# Patient Record
Sex: Female | Born: 1945 | Race: White | Hispanic: No | Smoking: Current every day smoker
Health system: Southern US, Community
[De-identification: ages and names within clinical notes are randomized; demographics above are authoritative.]

## PROBLEM LIST (undated history)

## (undated) DIAGNOSIS — Z9221 Personal history of antineoplastic chemotherapy: Secondary | ICD-10-CM

## (undated) DIAGNOSIS — R32 Unspecified urinary incontinence: Secondary | ICD-10-CM

## (undated) DIAGNOSIS — N200 Calculus of kidney: Secondary | ICD-10-CM

## (undated) DIAGNOSIS — I10 Essential (primary) hypertension: Secondary | ICD-10-CM

## (undated) DIAGNOSIS — C801 Malignant (primary) neoplasm, unspecified: Secondary | ICD-10-CM

## (undated) DIAGNOSIS — E7800 Pure hypercholesterolemia, unspecified: Secondary | ICD-10-CM

## (undated) DIAGNOSIS — K219 Gastro-esophageal reflux disease without esophagitis: Secondary | ICD-10-CM

## (undated) DIAGNOSIS — B019 Varicella without complication: Secondary | ICD-10-CM

## (undated) DIAGNOSIS — C3490 Malignant neoplasm of unspecified part of unspecified bronchus or lung: Secondary | ICD-10-CM

## (undated) DIAGNOSIS — Z923 Personal history of irradiation: Secondary | ICD-10-CM

## (undated) HISTORY — DX: Calculus of kidney: N20.0

## (undated) HISTORY — DX: Gastro-esophageal reflux disease without esophagitis: K21.9

## (undated) HISTORY — DX: Varicella without complication: B01.9

## (undated) HISTORY — DX: Malignant neoplasm of unspecified part of unspecified bronchus or lung: C34.90

## (undated) HISTORY — DX: Pure hypercholesterolemia, unspecified: E78.00

## (undated) HISTORY — DX: Essential (primary) hypertension: I10

## (undated) HISTORY — DX: Unspecified urinary incontinence: R32

## (undated) HISTORY — PX: ABDOMINAL HYSTERECTOMY: SHX81

---

## 2012-04-16 ENCOUNTER — Encounter (HOSPITAL_COMMUNITY): Payer: Self-pay | Admitting: *Deleted

## 2012-04-16 ENCOUNTER — Emergency Department (HOSPITAL_COMMUNITY): Payer: Commercial Managed Care - PPO

## 2012-04-16 ENCOUNTER — Other Ambulatory Visit: Payer: Self-pay

## 2012-04-16 ENCOUNTER — Inpatient Hospital Stay (HOSPITAL_COMMUNITY)
Admission: EM | Admit: 2012-04-16 | Discharge: 2012-04-19 | DRG: 040 | Disposition: A | Discharge status: Home / Self Care | Payer: Commercial Managed Care - PPO | Attending: Internal Medicine | Admitting: Internal Medicine

## 2012-04-16 DIAGNOSIS — C801 Malignant (primary) neoplasm, unspecified: Secondary | ICD-10-CM | POA: Diagnosis present

## 2012-04-16 DIAGNOSIS — J984 Other disorders of lung: Secondary | ICD-10-CM | POA: Diagnosis present

## 2012-04-16 DIAGNOSIS — Z720 Tobacco use: Secondary | ICD-10-CM | POA: Diagnosis present

## 2012-04-16 DIAGNOSIS — G936 Cerebral edema: Secondary | ICD-10-CM | POA: Diagnosis present

## 2012-04-16 DIAGNOSIS — Z66 Do not resuscitate: Secondary | ICD-10-CM | POA: Diagnosis present

## 2012-04-16 DIAGNOSIS — G9349 Other encephalopathy: Secondary | ICD-10-CM | POA: Diagnosis present

## 2012-04-16 DIAGNOSIS — R079 Chest pain, unspecified: Secondary | ICD-10-CM | POA: Diagnosis present

## 2012-04-16 DIAGNOSIS — C799 Secondary malignant neoplasm of unspecified site: Secondary | ICD-10-CM | POA: Diagnosis present

## 2012-04-16 DIAGNOSIS — F411 Generalized anxiety disorder: Secondary | ICD-10-CM | POA: Diagnosis present

## 2012-04-16 DIAGNOSIS — C7800 Secondary malignant neoplasm of unspecified lung: Secondary | ICD-10-CM | POA: Diagnosis present

## 2012-04-16 DIAGNOSIS — R410 Disorientation, unspecified: Secondary | ICD-10-CM | POA: Diagnosis present

## 2012-04-16 DIAGNOSIS — F17200 Nicotine dependence, unspecified, uncomplicated: Secondary | ICD-10-CM | POA: Diagnosis present

## 2012-04-16 DIAGNOSIS — C781 Secondary malignant neoplasm of mediastinum: Secondary | ICD-10-CM | POA: Diagnosis present

## 2012-04-16 DIAGNOSIS — C7900 Secondary malignant neoplasm of unspecified kidney and renal pelvis: Secondary | ICD-10-CM | POA: Diagnosis present

## 2012-04-16 DIAGNOSIS — C7931 Secondary malignant neoplasm of brain: Principal | ICD-10-CM | POA: Diagnosis present

## 2012-04-16 DIAGNOSIS — R030 Elevated blood-pressure reading, without diagnosis of hypertension: Secondary | ICD-10-CM | POA: Diagnosis present

## 2012-04-16 DIAGNOSIS — C770 Secondary and unspecified malignant neoplasm of lymph nodes of head, face and neck: Secondary | ICD-10-CM | POA: Diagnosis present

## 2012-04-16 DIAGNOSIS — C7889 Secondary malignant neoplasm of other digestive organs: Secondary | ICD-10-CM | POA: Diagnosis present

## 2012-04-16 DIAGNOSIS — R630 Anorexia: Secondary | ICD-10-CM | POA: Diagnosis present

## 2012-04-16 DIAGNOSIS — R0789 Other chest pain: Secondary | ICD-10-CM | POA: Diagnosis present

## 2012-04-16 DIAGNOSIS — E279 Disorder of adrenal gland, unspecified: Secondary | ICD-10-CM | POA: Diagnosis present

## 2012-04-16 DIAGNOSIS — IMO0001 Reserved for inherently not codable concepts without codable children: Secondary | ICD-10-CM | POA: Diagnosis present

## 2012-04-16 HISTORY — DX: Malignant (primary) neoplasm, unspecified: C80.1

## 2012-04-16 LAB — BASIC METABOLIC PANEL
CO2: 28 mEq/L (ref 19–32)
Calcium: 9.5 mg/dL (ref 8.4–10.5)
Creatinine, Ser: 0.71 mg/dL (ref 0.50–1.10)
Glucose, Bld: 96 mg/dL (ref 70–99)

## 2012-04-16 LAB — CBC
Hemoglobin: 14.6 g/dL (ref 12.0–15.0)
MCH: 30.2 pg (ref 26.0–34.0)
MCV: 86.2 fL (ref 78.0–100.0)
Platelets: 380 10*3/uL (ref 150–400)
RBC: 4.84 MIL/uL (ref 3.87–5.11)

## 2012-04-16 MED ORDER — HYDROMORPHONE HCL PF 1 MG/ML IJ SOLN
0.5000 mg | INTRAMUSCULAR | Status: DC | PRN
  Administered 2012-04-16 – 2012-04-17 (×2): 0.5 mg via INTRAVENOUS
  Administered 2012-04-17 – 2012-04-18 (×6): 1 mg via INTRAVENOUS
  Filled 2012-04-16 (×9): qty 1

## 2012-04-16 MED ORDER — IOHEXOL 300 MG/ML  SOLN
100.0000 mL | Freq: Once | INTRAMUSCULAR | Status: AC | PRN
  Administered 2012-04-16: 100 mL via INTRAVENOUS

## 2012-04-16 MED ORDER — DEXAMETHASONE SODIUM PHOSPHATE 10 MG/ML IJ SOLN
10.0000 mg | Freq: Four times a day (QID) | INTRAMUSCULAR | Status: AC
  Administered 2012-04-17 (×3): 10 mg via INTRAVENOUS
  Filled 2012-04-16 (×5): qty 1

## 2012-04-16 MED ORDER — DEXAMETHASONE 4 MG PO TABS
4.0000 mg | ORAL_TABLET | Freq: Four times a day (QID) | ORAL | Status: DC
  Administered 2012-04-17 – 2012-04-19 (×8): 4 mg via ORAL
  Filled 2012-04-16 (×10): qty 1

## 2012-04-16 MED ORDER — MORPHINE SULFATE 4 MG/ML IJ SOLN
4.0000 mg | Freq: Once | INTRAMUSCULAR | Status: AC
  Administered 2012-04-16: 4 mg via INTRAVENOUS
  Filled 2012-04-16: qty 1

## 2012-04-16 MED ORDER — DEXAMETHASONE SODIUM PHOSPHATE 10 MG/ML IJ SOLN
10.0000 mg | Freq: Once | INTRAMUSCULAR | Status: AC
  Administered 2012-04-16: 10 mg via INTRAVENOUS
  Filled 2012-04-16: qty 1

## 2012-04-16 MED ORDER — ONDANSETRON HCL 4 MG/2ML IJ SOLN
4.0000 mg | Freq: Once | INTRAMUSCULAR | Status: AC
  Administered 2012-04-16: 4 mg via INTRAVENOUS
  Filled 2012-04-16: qty 2

## 2012-04-16 NOTE — H&P (Addendum)
Patient's PCP: Patient reports that she does not have a primary care physician.  Chief Complaint: Chest pain and altered mental status  History of Present Illness: Isabella Carpenter is a 67 y.o. Caucasian female with no significant past medical history and has not seen a physician for a long time who was brought in by family for the above reasons.  Over the last week family has noted that she has had intermittent episodes of confusion.  Per daughter, on 04/14/2012, patient appeared confused on the telephone and had only short one-word yes or no responses.  Over the weekend family was notified by patient's coworker on facebook that the patient was confused at work over the last week.  Over the last 2-3 weeks she has had intermittent episodes of left-sided chest pain.  When family confronted the patient today, she was agreeable to coming to the emergency department for further evaluation.  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  Hospitalist service was consulted for further care and management.  Patient has not had any recent fevers, chills, nausea, vomiting, shortness of breath, or abdominal pain.  She did complain of diarrhea 2 days ago which has resolved.  She does complain of left-sided headache with intermittent visual changes.  Currently denies any visual changes.  Review of Systems: All systems reviewed with the patient and positive as per history of present illness, otherwise all other systems are negative.  History reviewed. No pertinent past medical history. History reviewed. No pertinent past surgical history. Family History  Problem Relation Age of Onset  . Heart attack Father   . Kidney cancer Father    History   Social History  . Marital Status: Widowed    Spouse Name: N/A    Number of Children: N/A  . Years of Education: N/A   Occupational History  . Not on file.   Social History Main Topics  . Smoking status: Current Every Day Smoker -- 2.00  packs/day for 30 years    Types: Cigarettes  . Smokeless tobacco: Not on file  . Alcohol Use: Not on file  . Drug Use: Not on file  . Sexually Active: Not on file   Other Topics Concern  . Not on file   Social History Narrative  . No narrative on file   Allergies: Review of patient's allergies indicates no known allergies.  Home Meds: Prior to Admission medications   Medication Sig Start Date End Date Taking? Authorizing Provider  ibuprofen (ADVIL,MOTRIN) 200 MG tablet Take 200 mg by mouth every 6 (six) hours as needed for pain. For pain   Yes Historical Provider, MD    Physical Exam: Blood pressure 174/87, pulse 66, temperature 98.3 F (36.8 C), temperature source Oral, resp. rate 18, SpO2 94.00%. General: Awake, Oriented x3, No acute distress. HEENT: EOMI, Moist mucous membranes, able to read words and sentences without difficulty. Neck: Supple, 2 palpable left supraclavicular nodes CV: S1 and S2 Lungs: Clear to ascultation bilaterally Abdomen: Soft, Nontender, Nondistended, +bowel sounds. Ext: Good pulses. Trace edema. No clubbing or cyanosis noted. Neuro: Cranial Nerves II-XII grossly intact. Has 5/5 motor strength in upper and lower extremities. Chest: No reproducible pain with palpation.   Lab results:  Recent Labs  04/16/12 1924  NA 138  K 3.5  CL 102  CO2 28  GLUCOSE 96  BUN 8  CREATININE 0.71  CALCIUM 9.5   No results found for this basename: AST, ALT, ALKPHOS, BILITOT, PROT, ALBUMIN,  in the last 72  hours No results found for this basename: LIPASE, AMYLASE,  in the last 72 hours  Recent Labs  04/16/12 1924  WBC 8.6  HGB 14.6  HCT 41.7  MCV 86.2  PLT 380    Recent Labs  04/16/12 1924  TROPONINI <0.30   No components found with this basename: POCBNP,  No results found for this basename: DDIMER,  in the last 72 hours No results found for this basename: HGBA1C,  in the last 72 hours No results found for this basename: CHOL, HDL, LDLCALC,  TRIG, CHOLHDL, LDLDIRECT,  in the last 72 hours No results found for this basename: TSH, T4TOTAL, FREET3, T3FREE, THYROIDAB,  in the last 72 hours No results found for this basename: VITAMINB12, FOLATE, FERRITIN, TIBC, IRON, RETICCTPCT,  in the last 72 hours Imaging results:  Dg Chest 2 View  04/16/2012  *RADIOLOGY REPORT*  Clinical Data: Chest pain.  Neck pain.  Tobacco use.  CHEST - 2 VIEW  Comparison: None.  Findings: Scattered pulmonary nodules and pulmonary masses are observed bilaterally.  An index mass in the right to middle lobe measures 5.4 x 3.2 by 5.2 cm.  Mass-like scarring noted along the left lung apex.  No cardiomegaly noted, but the aortic arch seems to be right-sided.  Unusual lucency at the T12-L1 level on the frontal projection may to be due to bowel gas although a vertebral anomaly cannot be totally excluded.  No pleural effusion is identified.  IMPRESSION:  1.  Scattered nodules and masses throughout both lungs.  Appearance favors metastatic malignancy to both lungs.  Fungal pneumonia and rheumatoid nodules are less likely differential diagnostic considerations.  CT chest with contrast is recommended for further workup, and considering the bilaterality of disease, imaging of the abdomen and pelvis may be prudent as well. 2.  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 3.  Lucency at the thoracolumbar junction on the frontal projection is somewhat unusual but probably simply due to bowel gas.  This could be further characterized on the CT exam to ensure that does not represent a vertebral anomaly.   Original Report Authenticated By: Gaylyn Rong, M.D.    Ct Head W Wo Contrast  04/16/2012  *RADIOLOGY REPORT*  Clinical Data: Forgetful; known lung mass.  Assess for brain metastases.  CT HEAD WITHOUT AND WITH CONTRAST  Technique:  Contiguous axial images were obtained from the base of the skull through the vertex without and with intravenous contrast.  Contrast:  OMNIPAQUE IOHEXOL 300 MG/ML  SOLN  Comparison: None.  Findings: Evaluation is suboptimal due to motion artifact. However, multiple prominent areas of decreased attenuation are seen throughout the cerebral hemispheres and at the left side of the cerebellum, compatible with multiple metastases.  Identifiable masses measure perhaps 2.0 cm at the high left frontal lobe, 2.5 cm at the expected location of the frontal horn of the left lateral ventricle, 1.5 cm at the left occipital lobe, 1.5 cm at the left cerebellar hemisphere and 1.7 cm at the posterior right parietal lobe.  There is perhaps 7 mm of rightward midline shift, though this is difficult to fully characterize.  There is no evidence of acute infarction, or intra- or extra-axial hemorrhage on CT.  There is no evidence of fracture; visualized osseous structures are unremarkable in appearance.  The visualized portions of the orbits are within normal limits.  The paranasal sinuses and mastoid air cells are well-aerated.  No significant soft tissue abnormalities are seen.  IMPRESSION:  1.  Multiple cerebral and  cerebellar metastases noted, with extensive areas of decreased attenuation, effacement of the frontal horn of the left lateral ventricle and perhaps 7 mm of rightward midline shift. 2.  No definite evidence of intracranial hemorrhage.  These results were called by telephone on 04/16/2012 at 09:21 p.m. to Earley Favor PA, who verbally acknowledged these results.   Original Report Authenticated By: Tonia Ghent, M.D.    Ct Chest W Contrast  04/16/2012  *RADIOLOGY REPORT*  Clinical Data:  Assess for chest and abdominal metastatic disease; known lung masses.  CT CHEST, ABDOMEN AND PELVIS WITH CONTRAST  Technique:  Multidetector CT imaging of the chest, abdomen and pelvis was performed following the standard protocol during bolus administration of intravenous contrast.  Contrast: OMNIPAQUE IOHEXOL 300 MG/ML  SOLN  Comparison:   None.  CT CHEST  Findings:   There are two dominant masses within the lungs, with a third somewhat smaller mass also seen.  The largest mass is noted at the left upper lobe, with significant architectural distortion; it measures approximately 8.6 x 4.4 x 6.5 cm, with partial occlusion of the bronchioles to the left upper lobe, and marked mass effect on the left main pulmonary artery.  A large 6.2 x 3.7 x 3.9 cm mass is noted within the right middle lobe.  There is also a spiculated 3.1 x 3.1 x 2.4 cm mass at the right upper lobe, with significant architectural distortion.  Innumerable smaller metastases are noted throughout both lungs.  No pleural effusion is identified.  There is mild underlying emphysema, noted particularly at the right upper lobe.  No pneumothorax is seen.  Scattered coronary artery calcifications are noted.  The patient's left upper lobe mass extends into the mediastinum, with irregular lobulations.  It remains a few millimeters from both the trachea and the descending thoracic aorta, though slightly closer to the course of the left subclavian artery.  Scattered metastases are noted within the mediastinum, demonstrating centrally decreased attenuation.  These are noted at the right paratracheal and periaortic regions, measuring up to 1.3 cm in short axis.  No pericardial effusion is identified. Scattered calcific atherosclerotic disease is noted along the aortic arch.  The great vessels are grossly unremarkable in appearance.  Minimal hypodensity at the left thyroid lobe is nonspecific in appearance, and likely benign given its size.  An enlarged 1.4 cm left supraclavicular node is seen.  Scattered axillary nodes remain normal in size.  No acute osseous abnormalities are seen.  IMPRESSION:  1.  Two dominant masses within the lungs, at the left upper lobe and right middle lobe.  Smaller spiculated mass at the right upper lobe.  Significant associated architectural distortion; the left upper lobe mass extends into the  mediastinum.  Partial occlusion of the bronchus to the left upper lobe, and significant mass effect on the left main pulmonary artery. 2.  Innumerable small metastases noted throughout both lungs. 3.  Mild underlying emphysema, particularly at the right upper lobe. 4.  Scattered coronary artery calcifications seen. 5.  Scattered metastases within the mediastinum, at the right paratracheal and periaortic regions, measuring up to 1.3 cm in short axis. 6.  1.4 cm centrally hypodense left supraclavicular node, concerning for metastatic disease. 7.  Minimal hypodensity at the left thyroid lobe is nonspecific, and likely benign given its size.  CT ABDOMEN AND PELVIS  Findings:  A few hypodensities within the liver are nonspecific but likely reflect small cysts.  The liver is otherwise unremarkable in appearance.  Two vague hypodense  lesions are noted within the spleen, measuring 2.0 and 1.5 cm, concerning for metastases.  There is a large complex heterogeneous left adrenal mass, measuring 4.6 cm in size, concerning for metastatic disease.  The right adrenal gland is unremarkable in appearance.  A large stone is noted within the gallbladder; the gallbladder is otherwise grossly unremarkable.  The pancreas is grossly unremarkable.  A nonobstructing 7 mm stone is noted at the interpole region of the left kidney.  Scattered bilateral renal cysts are seen, measuring up to 1.7 cm in size. There is no evidence of hydronephrosis.  No obstructing ureteral stones are seen.  No perinephric stranding is appreciated.  No free fluid is identified.  The small bowel is unremarkable in appearance.  The stomach is within normal limits.  No acute vascular abnormalities are seen.  Scattered calcification is noted along the abdominal aorta and its branches.  The appendix is normal in caliber, without evidence for appendicitis.  The colon is unremarkable in appearance.  The bladder is mildly distended and grossly unremarkable in appearance.   The patient is status post hysterectomy.  No suspicious adnexal masses are seen; the left ovary is unremarkable in appearance.  No inguinal lymphadenopathy is seen.  No acute osseous abnormalities are identified.  There is nonspecific sclerosis at the pubic symphysis, and vacuum phenomenon and mild sclerotic change are noted at the sacroiliac joints bilaterally.  Degenerative change is noted along the lower lumbar spine.  IMPRESSION:  1.  Large complex left adrenal mass, measuring 4.6 cm, concerning for metastatic disease. 2.  Two vague hypodensities within the spleen, concerning for metastatic disease. 3.  Small hepatic and renal hypodensities likely reflect cysts. 4.  Cholelithiasis; gallbladder otherwise unremarkable in appearance. 5.  Nonobstructing 7 mm stone at the interpole region of the left kidney. 6.  Scattered calcification along the abdominal aorta and its branches. 7.  No definite evidence for osseous metastatic disease; nonspecific sclerosis at the pubic symphysis, and mild degenerative change along the lower lumbar spine and at the sacroiliac joints.   Original Report Authenticated By: Tonia Ghent, M.D.    Ct Abdomen Pelvis W Contrast  04/16/2012  *RADIOLOGY REPORT*  Clinical Data:  Assess for chest and abdominal metastatic disease; known lung masses.  CT CHEST, ABDOMEN AND PELVIS WITH CONTRAST  Technique:  Multidetector CT imaging of the chest, abdomen and pelvis was performed following the standard protocol during bolus administration of intravenous contrast.  Contrast: OMNIPAQUE IOHEXOL 300 MG/ML  SOLN  Comparison:   None.  CT CHEST  Findings:  There are two dominant masses within the lungs, with a third somewhat smaller mass also seen.  The largest mass is noted at the left upper lobe, with significant architectural distortion; it measures approximately 8.6 x 4.4 x 6.5 cm, with partial occlusion of the bronchioles to the left upper lobe, and marked mass effect on the left main pulmonary  artery.  A large 6.2 x 3.7 x 3.9 cm mass is noted within the right middle lobe.  There is also a spiculated 3.1 x 3.1 x 2.4 cm mass at the right upper lobe, with significant architectural distortion.  Innumerable smaller metastases are noted throughout both lungs.  No pleural effusion is identified.  There is mild underlying emphysema, noted particularly at the right upper lobe.  No pneumothorax is seen.  Scattered coronary artery calcifications are noted.  The patient's left upper lobe mass extends into the mediastinum, with irregular lobulations.  It remains a few millimeters from  both the trachea and the descending thoracic aorta, though slightly closer to the course of the left subclavian artery.  Scattered metastases are noted within the mediastinum, demonstrating centrally decreased attenuation.  These are noted at the right paratracheal and periaortic regions, measuring up to 1.3 cm in short axis.  No pericardial effusion is identified. Scattered calcific atherosclerotic disease is noted along the aortic arch.  The great vessels are grossly unremarkable in appearance.  Minimal hypodensity at the left thyroid lobe is nonspecific in appearance, and likely benign given its size.  An enlarged 1.4 cm left supraclavicular node is seen.  Scattered axillary nodes remain normal in size.  No acute osseous abnormalities are seen.  IMPRESSION:  1.  Two dominant masses within the lungs, at the left upper lobe and right middle lobe.  Smaller spiculated mass at the right upper lobe.  Significant associated architectural distortion; the left upper lobe mass extends into the mediastinum.  Partial occlusion of the bronchus to the left upper lobe, and significant mass effect on the left main pulmonary artery. 2.  Innumerable small metastases noted throughout both lungs. 3.  Mild underlying emphysema, particularly at the right upper lobe. 4.  Scattered coronary artery calcifications seen. 5.  Scattered metastases within the  mediastinum, at the right paratracheal and periaortic regions, measuring up to 1.3 cm in short axis. 6.  1.4 cm centrally hypodense left supraclavicular node, concerning for metastatic disease. 7.  Minimal hypodensity at the left thyroid lobe is nonspecific, and likely benign given its size.  CT ABDOMEN AND PELVIS  Findings:  A few hypodensities within the liver are nonspecific but likely reflect small cysts.  The liver is otherwise unremarkable in appearance.  Two vague hypodense lesions are noted within the spleen, measuring 2.0 and 1.5 cm, concerning for metastases.  There is a large complex heterogeneous left adrenal mass, measuring 4.6 cm in size, concerning for metastatic disease.  The right adrenal gland is unremarkable in appearance.  A large stone is noted within the gallbladder; the gallbladder is otherwise grossly unremarkable.  The pancreas is grossly unremarkable.  A nonobstructing 7 mm stone is noted at the interpole region of the left kidney.  Scattered bilateral renal cysts are seen, measuring up to 1.7 cm in size. There is no evidence of hydronephrosis.  No obstructing ureteral stones are seen.  No perinephric stranding is appreciated.  No free fluid is identified.  The small bowel is unremarkable in appearance.  The stomach is within normal limits.  No acute vascular abnormalities are seen.  Scattered calcification is noted along the abdominal aorta and its branches.  The appendix is normal in caliber, without evidence for appendicitis.  The colon is unremarkable in appearance.  The bladder is mildly distended and grossly unremarkable in appearance.  The patient is status post hysterectomy.  No suspicious adnexal masses are seen; the left ovary is unremarkable in appearance.  No inguinal lymphadenopathy is seen.  No acute osseous abnormalities are identified.  There is nonspecific sclerosis at the pubic symphysis, and vacuum phenomenon and mild sclerotic change are noted at the sacroiliac joints  bilaterally.  Degenerative change is noted along the lower lumbar spine.  IMPRESSION:  1.  Large complex left adrenal mass, measuring 4.6 cm, concerning for metastatic disease. 2.  Two vague hypodensities within the spleen, concerning for metastatic disease. 3.  Small hepatic and renal hypodensities likely reflect cysts. 4.  Cholelithiasis; gallbladder otherwise unremarkable in appearance. 5.  Nonobstructing 7 mm stone at the interpole  region of the left kidney. 6.  Scattered calcification along the abdominal aorta and its branches. 7.  No definite evidence for osseous metastatic disease; nonspecific sclerosis at the pubic symphysis, and mild degenerative change along the lower lumbar spine and at the sacroiliac joints.   Original Report Authenticated By: Tonia Ghent, M.D.    Other results: EKG: Normal sinus rhythm with heart rate of 64.  Assessment & Plan by Problem: Chest pain Initial troponin negative.  EKG not concerning for any ischemic changes.  Continue to monitor on telemetry, cycle cardiac enzymes to rule patient out for acute coronary syndrome.  Suspect patient's chest pain is likely related to metastatic disease in her lungs as imaging showed 2 dominant mass is in the left upper and right middle lobe.  On the left there is a partial occlusion of the bronchus to the left upper lobe with significant mass effect on the left main pulmonary artery.  Consider pulmonology consultation in the morning.  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  Patient has palpable left supraclavicular lymph nodes which may be easily biopsied.  General surgery will need to be called in the morning for consultation.  Once pathological diagnosis is obtained, may benefit from oncology evaluation.  Acute delirium due to metastatic disease to the brain Imaging showed multiple cerebral and cerebellar metastasis with effacement  of frontal horn of the left lateral ventricle and perhaps 7 mm rightward midline shift.  Discussed with Dr. Jordan Likes, neurosurgery, given patient's extensive metastatic disease did not think he could offer the patient anything from a neurosurgical standpoint as she does not have any deficits at this time.  Dr. Jordan Likes, recommended dexamethasone 10 mg IV every 6 hours for 4 doses then dexamethasone 4 mg by mouth every 6 hours and recommended whole brain radiation.  Discussed with Dr. Kathrynn Running, radiation oncology, who will evaluate the patient in the morning.  Check blood sugars a.c. and at bedtime given steroids.  Elevated blood pressure Likely related to pain and anxiety.  Continue to monitor.  When necessary hydralazine for systolic blood pressure greater than 160.  Tobacco abuse Counseled the patient on cessation.  Prophylaxis Lovenox.  CODE STATUS DO NOT RESUSCITATE/DO NOT INTUBATE.  This was discussed with the patient and daughter at the time of admission.  Disposition Admit the patient as inpatient to telemetry.  Daughter updated at bedside.  Time spent on admission, talking to the patient, and coordinating care was: 60 mins.  Bevin Mayall A, MD 04/16/2012, 11:43 PM

## 2012-04-16 NOTE — ED Notes (Addendum)
Pt reported the Cp has been present for 3 weeks. Family forced Pt to come in to ED for an evaluation tonight.. During Pt assessment Pt stated Her family did not know she was here but Pt daughter was sitting chair next to PT. Pt questioned about the statement that family did not know about being in the E. Pt did not have an answer. Pt Neuro in tact and A/Ox 4. Pt also denies any SI thoughts or plan.

## 2012-04-16 NOTE — ED Provider Notes (Signed)
History     CSN: [id number]  Arrival date & time 04/16/12  1851   First MD Initiated Contact with Patient 04/16/12 1955      Chief Complaint  Patient presents with  . Chest Pain    (Consider location/radiation/quality/duration/timing/severity/associated sxs/prior treatment) HPI Comments: Patient reports, that for the last 3, weeks.  She's had intermittent, worsening chest pain, which is vague in, nature.  It's not precipitated by movement position, food, she is a 2 pack a day smoker for many years.  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  All day every day.  Her appetite has not changed.  She states she is not sleeping well for the last couple weeks.  She has also noted some firm nodules in the supraclavicular area on the left that are not painful.  Her family brought her to the emergency department today for evaluation of a growing concerned by her coworkers for weight loss, and the fact that she does not quite appear to be herself.  She has become forgetful, which is very unlike her.  Patient is a 67 y.o. female presenting with chest pain. The history is provided by the patient.  Chest Pain Pain location:  Unable to specify Pain quality: dull   Pain radiates to:  Does not radiate Pain radiates to the back: no   Pain severity:  Moderate Onset quality:  Unable to specify Duration:  3 weeks Timing:  Intermittent Progression:  Worsening Chronicity:  New Context: at rest   Context: not breathing, not lifting, no movement and not raising an arm   Relieved by:  None tried Worsened by:  Nothing tried Associated symptoms: cough   Associated symptoms: no dizziness, no fever, no headache, no nausea, no numbness, no shortness of breath and no weakness   Risk factors: smoking     Past Medical History  Diagnosis Date  . Cancer     Past Surgical History  Procedure Laterality Date  .  Abdominal hysterectomy      Family History  Problem Relation Age of Onset  . Heart attack Father   . Kidney cancer Father     History  Substance Use Topics  . Smoking status: Current Every Day Smoker -- 2.00 packs/day for 30 years    Types: Cigarettes  . Smokeless tobacco: Not on file  . Alcohol Use: Not on file    OB History   Grav Para Term Preterm Abortions TAB SAB Ect Mult Living                  Review of Systems  Constitutional: Negative for fever and chills.  HENT: Negative for neck pain and neck stiffness.   Respiratory: Positive for cough. Negative for chest tightness and shortness of breath.   Cardiovascular: Positive for chest pain. Negative for leg swelling.  Gastrointestinal: Negative for nausea.  Skin: Positive for pallor. Negative for wound.  Neurological: Negative for dizziness, speech difficulty, weakness, numbness and headaches.  All other systems reviewed and are negative.    Allergies  Review of patient's allergies indicates no known allergies.  Home Medications   No current outpatient prescriptions on file.  BP 181/86  Pulse 63  Temp(Src) 97.8 F (36.6 C) (Oral)  Resp 22  Ht 5' 5.5" (1.664 m)  Wt 131 lb 6.3 oz (59.6 kg)  BMI 21.52 kg/m2  SpO2 98%  Physical  Exam  Nursing note and vitals reviewed. Constitutional: She is oriented to person, place, and time. She appears well-developed and well-nourished.  HENT:  Head: Normocephalic.  Eyes: Pupils are equal, round, and reactive to light.  Neck: Normal range of motion. Neck supple.    Cardiovascular: Normal rate and regular rhythm.   Pulmonary/Chest: Effort normal and breath sounds normal. She exhibits tenderness.  Abdominal: Soft. She exhibits no distension. There is no tenderness.  Musculoskeletal: Normal range of motion. She exhibits no edema and no tenderness.  Lymphadenopathy:    She has no cervical adenopathy.  Neurological: She is oriented to person, place, and time.  Skin:  Skin is warm and dry. No rash noted. There is pallor.    ED Course  Procedures (including critical care time)  Labs Reviewed  BASIC METABOLIC PANEL - Abnormal; Notable for the following:    GFR calc non Af Amer 89 (*)    All other components within normal limits  CBC  TROPONIN I  TROPONIN I  TROPONIN I  BASIC METABOLIC PANEL  CBC   Dg Chest 2 View  04/16/2012  *RADIOLOGY REPORT*  Clinical Data: Chest pain.  Neck pain.  Tobacco use.  CHEST - 2 VIEW  Comparison: None.  Findings: Scattered pulmonary nodules and pulmonary masses are observed bilaterally.  An index mass in the right to middle lobe measures 5.4 x 3.2 by 5.2 cm.  Mass-like scarring noted along the left lung apex.  No cardiomegaly noted, but the aortic arch seems to be right-sided.  Unusual lucency at the T12-L1 level on the frontal projection may to be due to bowel gas although a vertebral anomaly cannot be totally excluded.  No pleural effusion is identified.  IMPRESSION:  1.  Scattered nodules and masses throughout both lungs.  Appearance favors metastatic malignancy to both lungs.  Fungal pneumonia and rheumatoid nodules are less likely differential diagnostic considerations.  CT chest with contrast is recommended for further workup, and considering the bilaterality of disease, imaging of the abdomen and pelvis may be prudent as well. 2.  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 3.  Lucency at the thoracolumbar junction on the frontal projection is somewhat unusual but probably simply due to bowel gas.  This could be further characterized on the CT exam to ensure that does not represent a vertebral anomaly.   Original Report Authenticated By: Gaylyn Rong, M.D.    Ct Head W Wo Contrast  04/16/2012  *RADIOLOGY REPORT*  Clinical Data: Forgetful; known lung mass.  Assess for brain metastases.  CT HEAD WITHOUT AND WITH CONTRAST  Technique:  Contiguous axial images were obtained from the base of the skull through the  vertex without and with intravenous contrast.  Contrast: OMNIPAQUE IOHEXOL 300 MG/ML  SOLN  Comparison: None.  Findings: Evaluation is suboptimal due to motion artifact. However, multiple prominent areas of decreased attenuation are seen throughout the cerebral hemispheres and at the left side of the cerebellum, compatible with multiple metastases.  Identifiable masses measure perhaps 2.0 cm at the high left frontal lobe, 2.5 cm at the expected location of the frontal horn of the left lateral ventricle, 1.5 cm at the left occipital lobe, 1.5 cm at the left cerebellar hemisphere and 1.7 cm at the posterior right parietal lobe.  There is perhaps 7 mm of rightward midline shift, though this is difficult to fully characterize.  There is no evidence of acute infarction, or intra- or extra-axial hemorrhage on CT.  There is no evidence of  fracture; visualized osseous structures are unremarkable in appearance.  The visualized portions of the orbits are within normal limits.  The paranasal sinuses and mastoid air cells are well-aerated.  No significant soft tissue abnormalities are seen.  IMPRESSION:  1.  Multiple cerebral and cerebellar metastases noted, with extensive areas of decreased attenuation, effacement of the frontal horn of the left lateral ventricle and perhaps 7 mm of rightward midline shift. 2.  No definite evidence of intracranial hemorrhage.  These results were called by telephone on 04/16/2012 at 09:21 p.m. to Earley Favor PA, who verbally acknowledged these results.   Original Report Authenticated By: Tonia Ghent, M.D.    Ct Chest W Contrast  04/16/2012  *RADIOLOGY REPORT*  Clinical Data:  Assess for chest and abdominal metastatic disease; known lung masses.  CT CHEST, ABDOMEN AND PELVIS WITH CONTRAST  Technique:  Multidetector CT imaging of the chest, abdomen and pelvis was performed following the standard protocol during bolus administration of intravenous contrast.  Contrast: OMNIPAQUE  IOHEXOL 300 MG/ML  SOLN  Comparison:   None.  CT CHEST  Findings:  There are two dominant masses within the lungs, with a third somewhat smaller mass also seen.  The largest mass is noted at the left upper lobe, with significant architectural distortion; it measures approximately 8.6 x 4.4 x 6.5 cm, with partial occlusion of the bronchioles to the left upper lobe, and marked mass effect on the left main pulmonary artery.  A large 6.2 x 3.7 x 3.9 cm mass is noted within the right middle lobe.  There is also a spiculated 3.1 x 3.1 x 2.4 cm mass at the right upper lobe, with significant architectural distortion.  Innumerable smaller metastases are noted throughout both lungs.  No pleural effusion is identified.  There is mild underlying emphysema, noted particularly at the right upper lobe.  No pneumothorax is seen.  Scattered coronary artery calcifications are noted.  The patient's left upper lobe mass extends into the mediastinum, with irregular lobulations.  It remains a few millimeters from both the trachea and the descending thoracic aorta, though slightly closer to the course of the left subclavian artery.  Scattered metastases are noted within the mediastinum, demonstrating centrally decreased attenuation.  These are noted at the right paratracheal and periaortic regions, measuring up to 1.3 cm in short axis.  No pericardial effusion is identified. Scattered calcific atherosclerotic disease is noted along the aortic arch.  The great vessels are grossly unremarkable in appearance.  Minimal hypodensity at the left thyroid lobe is nonspecific in appearance, and likely benign given its size.  An enlarged 1.4 cm left supraclavicular node is seen.  Scattered axillary nodes remain normal in size.  No acute osseous abnormalities are seen.  IMPRESSION:  1.  Two dominant masses within the lungs, at the left upper lobe and right middle lobe.  Smaller spiculated mass at the right upper lobe.  Significant associated  architectural distortion; the left upper lobe mass extends into the mediastinum.  Partial occlusion of the bronchus to the left upper lobe, and significant mass effect on the left main pulmonary artery. 2.  Innumerable small metastases noted throughout both lungs. 3.  Mild underlying emphysema, particularly at the right upper lobe. 4.  Scattered coronary artery calcifications seen. 5.  Scattered metastases within the mediastinum, at the right paratracheal and periaortic regions, measuring up to 1.3 cm in short axis. 6.  1.4 cm centrally hypodense left supraclavicular node, concerning for metastatic disease. 7.  Minimal hypodensity at  the left thyroid lobe is nonspecific, and likely benign given its size.  CT ABDOMEN AND PELVIS  Findings:  A few hypodensities within the liver are nonspecific but likely reflect small cysts.  The liver is otherwise unremarkable in appearance.  Two vague hypodense lesions are noted within the spleen, measuring 2.0 and 1.5 cm, concerning for metastases.  There is a large complex heterogeneous left adrenal mass, measuring 4.6 cm in size, concerning for metastatic disease.  The right adrenal gland is unremarkable in appearance.  A large stone is noted within the gallbladder; the gallbladder is otherwise grossly unremarkable.  The pancreas is grossly unremarkable.  A nonobstructing 7 mm stone is noted at the interpole region of the left kidney.  Scattered bilateral renal cysts are seen, measuring up to 1.7 cm in size. There is no evidence of hydronephrosis.  No obstructing ureteral stones are seen.  No perinephric stranding is appreciated.  No free fluid is identified.  The small bowel is unremarkable in appearance.  The stomach is within normal limits.  No acute vascular abnormalities are seen.  Scattered calcification is noted along the abdominal aorta and its branches.  The appendix is normal in caliber, without evidence for appendicitis.  The colon is unremarkable in appearance.  The  bladder is mildly distended and grossly unremarkable in appearance.  The patient is status post hysterectomy.  No suspicious adnexal masses are seen; the left ovary is unremarkable in appearance.  No inguinal lymphadenopathy is seen.  No acute osseous abnormalities are identified.  There is nonspecific sclerosis at the pubic symphysis, and vacuum phenomenon and mild sclerotic change are noted at the sacroiliac joints bilaterally.  Degenerative change is noted along the lower lumbar spine.  IMPRESSION:  1.  Large complex left adrenal mass, measuring 4.6 cm, concerning for metastatic disease. 2.  Two vague hypodensities within the spleen, concerning for metastatic disease. 3.  Small hepatic and renal hypodensities likely reflect cysts. 4.  Cholelithiasis; gallbladder otherwise unremarkable in appearance. 5.  Nonobstructing 7 mm stone at the interpole region of the left kidney. 6.  Scattered calcification along the abdominal aorta and its branches. 7.  No definite evidence for osseous metastatic disease; nonspecific sclerosis at the pubic symphysis, and mild degenerative change along the lower lumbar spine and at the sacroiliac joints.   Original Report Authenticated By: Tonia Ghent, M.D.    Ct Abdomen Pelvis W Contrast  04/16/2012  *RADIOLOGY REPORT*  Clinical Data:  Assess for chest and abdominal metastatic disease; known lung masses.  CT CHEST, ABDOMEN AND PELVIS WITH CONTRAST  Technique:  Multidetector CT imaging of the chest, abdomen and pelvis was performed following the standard protocol during bolus administration of intravenous contrast.  Contrast: OMNIPAQUE IOHEXOL 300 MG/ML  SOLN  Comparison:   None.  CT CHEST  Findings:  There are two dominant masses within the lungs, with a third somewhat smaller mass also seen.  The largest mass is noted at the left upper lobe, with significant architectural distortion; it measures approximately 8.6 x 4.4 x 6.5 cm, with partial occlusion of the bronchioles to  the left upper lobe, and marked mass effect on the left main pulmonary artery.  A large 6.2 x 3.7 x 3.9 cm mass is noted within the right middle lobe.  There is also a spiculated 3.1 x 3.1 x 2.4 cm mass at the right upper lobe, with significant architectural distortion.  Innumerable smaller metastases are noted throughout both lungs.  No pleural effusion is identified.  There is mild underlying emphysema, noted particularly at the right upper lobe.  No pneumothorax is seen.  Scattered coronary artery calcifications are noted.  The patient's left upper lobe mass extends into the mediastinum, with irregular lobulations.  It remains a few millimeters from both the trachea and the descending thoracic aorta, though slightly closer to the course of the left subclavian artery.  Scattered metastases are noted within the mediastinum, demonstrating centrally decreased attenuation.  These are noted at the right paratracheal and periaortic regions, measuring up to 1.3 cm in short axis.  No pericardial effusion is identified. Scattered calcific atherosclerotic disease is noted along the aortic arch.  The great vessels are grossly unremarkable in appearance.  Minimal hypodensity at the left thyroid lobe is nonspecific in appearance, and likely benign given its size.  An enlarged 1.4 cm left supraclavicular node is seen.  Scattered axillary nodes remain normal in size.  No acute osseous abnormalities are seen.  IMPRESSION:  1.  Two dominant masses within the lungs, at the left upper lobe and right middle lobe.  Smaller spiculated mass at the right upper lobe.  Significant associated architectural distortion; the left upper lobe mass extends into the mediastinum.  Partial occlusion of the bronchus to the left upper lobe, and significant mass effect on the left main pulmonary artery. 2.  Innumerable small metastases noted throughout both lungs. 3.  Mild underlying emphysema, particularly at the right upper lobe. 4.  Scattered  coronary artery calcifications seen. 5.  Scattered metastases within the mediastinum, at the right paratracheal and periaortic regions, measuring up to 1.3 cm in short axis. 6.  1.4 cm centrally hypodense left supraclavicular node, concerning for metastatic disease. 7.  Minimal hypodensity at the left thyroid lobe is nonspecific, and likely benign given its size.  CT ABDOMEN AND PELVIS  Findings:  A few hypodensities within the liver are nonspecific but likely reflect small cysts.  The liver is otherwise unremarkable in appearance.  Two vague hypodense lesions are noted within the spleen, measuring 2.0 and 1.5 cm, concerning for metastases.  There is a large complex heterogeneous left adrenal mass, measuring 4.6 cm in size, concerning for metastatic disease.  The right adrenal gland is unremarkable in appearance.  A large stone is noted within the gallbladder; the gallbladder is otherwise grossly unremarkable.  The pancreas is grossly unremarkable.  A nonobstructing 7 mm stone is noted at the interpole region of the left kidney.  Scattered bilateral renal cysts are seen, measuring up to 1.7 cm in size. There is no evidence of hydronephrosis.  No obstructing ureteral stones are seen.  No perinephric stranding is appreciated.  No free fluid is identified.  The small bowel is unremarkable in appearance.  The stomach is within normal limits.  No acute vascular abnormalities are seen.  Scattered calcification is noted along the abdominal aorta and its branches.  The appendix is normal in caliber, without evidence for appendicitis.  The colon is unremarkable in appearance.  The bladder is mildly distended and grossly unremarkable in appearance.  The patient is status post hysterectomy.  No suspicious adnexal masses are seen; the left ovary is unremarkable in appearance.  No inguinal lymphadenopathy is seen.  No acute osseous abnormalities are identified.  There is nonspecific sclerosis at the pubic symphysis, and vacuum  phenomenon and mild sclerotic change are noted at the sacroiliac joints bilaterally.  Degenerative change is noted along the lower lumbar spine.  IMPRESSION:  1.  Large complex left adrenal mass, measuring  4.6 cm, concerning for metastatic disease. 2.  Two vague hypodensities within the spleen, concerning for metastatic disease. 3.  Small hepatic and renal hypodensities likely reflect cysts. 4.  Cholelithiasis; gallbladder otherwise unremarkable in appearance. 5.  Nonobstructing 7 mm stone at the interpole region of the left kidney. 6.  Scattered calcification along the abdominal aorta and its branches. 7.  No definite evidence for osseous metastatic disease; nonspecific sclerosis at the pubic symphysis, and mild degenerative change along the lower lumbar spine and at the sacroiliac joints.   Original Report Authenticated By: Tonia Ghent, M.D.      1. 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 Chest pain   4. Acute delirium   5. Elevated blood pressure     ED ECG REPORT   Date: 04/17/2012  EKG Time: 1:34 AM  Rate: 64  Rhythm: normal sinus rhythm,  there are no previous tracings available for comparison  Axis: normal  Intervals:none  ST&T Change: normal  Narrative Interpretation: normal             MDM   Plan x-ray of chest reveals multiple nodule masses concern for metastatic lung disease.  Recommend CT of chest and abdomen.  Have also added a CT of her head to to her recent forgetfulness. Patient's CT results of head, chest, abdomen, and pelvis were called back from the radiologist.  She has multiple metastatic lesions throughout with a 7 mm shift in her brain.  She has been given 10 mg of Decadron to help control the swelling.  Have spoken to the hospitalist, who would admit the patient. I've spoken at length with patient and family informed him of these devastating diagnosis.       Arman Filter, NP 04/17/12  0133  Arman Filter, NP 04/17/12 [phone]

## 2012-04-16 NOTE — ED Notes (Signed)
Pt reports CP became worse at 1500 today. Pt stated " I though I would just go ahead and die". Pt denies any SI or HI. Pt does report she smokes 2 pkg's a day . Pt is also pointed to the left side of neck where she feels 2 bumps.

## 2012-04-17 ENCOUNTER — Ambulatory Visit
Admit: 2012-04-17 | Discharge: 2012-04-17 | Disposition: A | Discharge status: Home / Self Care | Payer: Commercial Managed Care - PPO | Attending: Radiation Oncology | Admitting: Radiation Oncology

## 2012-04-17 ENCOUNTER — Encounter: Payer: Self-pay | Admitting: Radiation Oncology

## 2012-04-17 DIAGNOSIS — R404 Transient alteration of awareness: Secondary | ICD-10-CM

## 2012-04-17 DIAGNOSIS — C7949 Secondary malignant neoplasm of other parts of nervous system: Secondary | ICD-10-CM

## 2012-04-17 DIAGNOSIS — C770 Secondary and unspecified malignant neoplasm of lymph nodes of head, face and neck: Secondary | ICD-10-CM

## 2012-04-17 DIAGNOSIS — C7931 Secondary malignant neoplasm of brain: Secondary | ICD-10-CM | POA: Insufficient documentation

## 2012-04-17 LAB — CBC
HCT: 42.4 % (ref 36.0–46.0)
Hemoglobin: 13.7 g/dL (ref 12.0–15.0)
MCH: 28.5 pg (ref 26.0–34.0)
MCHC: 32.3 g/dL (ref 30.0–36.0)
MCV: 88.3 fL (ref 78.0–100.0)

## 2012-04-17 LAB — BASIC METABOLIC PANEL
BUN: 6 mg/dL (ref 6–23)
CO2: 27 mEq/L (ref 19–32)
Chloride: 102 mEq/L (ref 96–112)
GFR calc non Af Amer: >90 mL/min (ref >90)
Glucose, Bld: 142 mg/dL — ABNORMAL HIGH (ref 70–99)
Potassium: 3.9 mEq/L (ref 3.5–5.1)

## 2012-04-17 LAB — GLUCOSE, CAPILLARY
Glucose-Capillary: 183 mg/dL — ABNORMAL HIGH (ref 70–99)
Glucose-Capillary: 112 mg/dL — ABNORMAL HIGH (ref 70–99)

## 2012-04-17 MED ORDER — BISACODYL 10 MG RE SUPP: 10.0000 mg | Freq: Every day | RECTAL | Status: DC | PRN

## 2012-04-17 MED ORDER — ENSURE COMPLETE PO LIQD
237.0000 mL | Freq: Two times a day (BID) | ORAL | Status: DC
  Administered 2012-04-17 – 2012-04-19 (×5): 237 mL via ORAL

## 2012-04-17 MED ORDER — SENNA 8.6 MG PO TABS
1.0000 | ORAL_TABLET | Freq: Two times a day (BID) | ORAL | Status: DC
  Administered 2012-04-17 – 2012-04-19 (×4): 8.6 mg via ORAL
  Filled 2012-04-17 (×5): qty 1

## 2012-04-17 MED ORDER — SODIUM CHLORIDE 0.9 % IV SOLN
INTRAVENOUS | Status: DC
  Administered 2012-04-17 – 2012-04-19 (×4): via INTRAVENOUS

## 2012-04-17 MED ORDER — ENOXAPARIN SODIUM 40 MG/0.4ML ~~LOC~~ SOLN
40.0000 mg | SUBCUTANEOUS | Status: DC
  Administered 2012-04-17: 40 mg via SUBCUTANEOUS
  Filled 2012-04-17: qty 0.4

## 2012-04-17 MED ORDER — ACETAMINOPHEN 325 MG PO TABS: 650.0000 mg | ORAL_TABLET | Freq: Four times a day (QID) | ORAL | Status: DC | PRN

## 2012-04-17 MED ORDER — HYDRALAZINE HCL 20 MG/ML IJ SOLN: 10.0000 mg | Freq: Four times a day (QID) | INTRAMUSCULAR | Status: DC | PRN

## 2012-04-17 MED ORDER — ONDANSETRON HCL 4 MG/2ML IJ SOLN
4.0000 mg | Freq: Four times a day (QID) | INTRAMUSCULAR | Status: DC | PRN
  Filled 2012-04-17: qty 2

## 2012-04-17 MED ORDER — ADULT MULTIVITAMIN W/MINERALS CH
1.0000 | ORAL_TABLET | Freq: Every day | ORAL | Status: DC
  Administered 2012-04-18 – 2012-04-19 (×2): 1 via ORAL
  Filled 2012-04-17 (×2): qty 1

## 2012-04-17 MED ORDER — ONDANSETRON HCL 4 MG PO TABS: 4.0000 mg | ORAL_TABLET | Freq: Four times a day (QID) | ORAL | Status: DC | PRN

## 2012-04-17 MED ORDER — ACETAMINOPHEN 650 MG RE SUPP: 650.0000 mg | Freq: Four times a day (QID) | RECTAL | Status: DC | PRN

## 2012-04-17 MED ORDER — DOCUSATE SODIUM 100 MG PO CAPS
100.0000 mg | ORAL_CAPSULE | Freq: Two times a day (BID) | ORAL | Status: DC
  Administered 2012-04-17 – 2012-04-19 (×4): 100 mg via ORAL
  Filled 2012-04-17 (×6): qty 1

## 2012-04-17 NOTE — ED Provider Notes (Signed)
Medical screening examination/treatment/procedure(s) were performed by non-physician practitioner and as supervising physician I was immediately available for consultation/collaboration.   Alaycia Eardley B. Bernette Mayers, MD 04/17/12 2120

## 2012-04-17 NOTE — Progress Notes (Signed)
[facility] hospitalist progress note. Chief complaint. Transfer note. History of present illness. This 67 year old female seen at [facility] emergency room with chief complaint of chest pain and altered mental status. Over 2-3 weeks has had intermittent left chest pain. Patient has been noted confused over the past week. Unfortunately workup indicated metastatic disease of unknown primary with lesions noted in the brain, lungs, left adrenal, spleen. The patient to was transferred to [facility] for admission and to allow oncology to be involved with her care. I am seeing the patient at the bedside to ensure she remains stable post transfer and her orders transferred appropriately. Patient has no complaints at this time. Family member at the bedside and no one had any questions for me at this time. Vital signs. Temperature 97.8, pulse 63, respiration 22, blood pressure 181/86. O2 sats 98%. General appearance. Says a thin frail appearing elderly female who is alert and cooperative with exam. Cardiac. Rate and rhythm regular. Lungs. Some crackles heard in the right base. Otherwise clear without distress or cough. Stable O2 sats. Abdomen. Soft with positive bowel sounds. No pain with palpation. Neurologic. Cranial nerves II through XII grossly intact. No unilateral or focal defects noted. Impression/plan Problem #1. Metastatic disease of unknown primary. Patient is a tobacco smoker and lung cancer is suspected as the point of origin. General surgery to be called in a.m. Problem #2. Acute delirium secondary to metastatic disease to the brain. Neurosurgery has recommended dexamethasone 10 mg IV every 6 hours for 4 doses then 4 mg by mouth every 6 hours. Radiation oncology will need to become involved in the patient's care. Problem #3. Hypertension. Patient has when necessary hydralazine ordered for elevated blood pressures. Patient appears stable post transfer and all orders appear to have transferred  appropriately.

## 2012-04-17 NOTE — Consult Note (Addendum)
Radiation Oncology         [phone]) [phone] ________________________________  Initial inpatient Consultation  Name: Isabella Carpenter MRN: [id number]  Date: 04/16/2012  DOB: [birth year]   REFERRING PHYSICIAN: Andreas Blower MD  DIAGNOSIS: Putative Brain Metastases  HISTORY OF PRESENT ILLNESS::Isabella Carpenter is a 67 y.o. female who is admitted yesterday to the emergency room with complaints of chest pain and altered mental status. She has a history of 60 pack year smoking. She does not have a primary care physician. Upon admission, the patient underwent CT scans of her head abdomen and chest. These scans are suspicious two dominant masses in the chest and for multiple nodules in the lungs consistent with metastases. She has mediastinal and left supraclavicular adenopathy. 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  Her brain demonstrated multiple masses on CT scan with surrounding edema, consistent with metastases. There is about 7 mm of right midline shift. No evidence of intracranial hemorrhage. CT scans were done with contrast.  Biopsies are pending.  She reports that at home she had been experiencing headaches and bilateral visual changes. Her headaches went away after starting Decadron in the hospital. She denies any nausea or shortness of breath. She reports that she had weakness on her left side, left arm more than left leg. This weakness is improved since admission. She reports weight loss of 10-12 pounds recently. Most recently she has been smoking 6 cigarettes per day. She reports that her chest pain has improved since admission. She denies any abdominal pain. She does acknowledge palpable masses in her left supraclavicular region for about 3 months.  The patient lives in [place].   PAST MEDICAL HISTORY:  has a past medical history of Cancer.    PAST SURGICAL HISTORY: Past Surgical History  Procedure Laterality Date  . Abdominal hysterectomy      FAMILY HISTORY: family history  includes Heart attack in her father and Kidney cancer in her father.  SOCIAL HISTORY:  reports that she has been smoking Cigarettes.  She has a 60 pack-year smoking history. She does not have any smokeless tobacco history on file.  ALLERGIES: Review of patient's allergies indicates no known allergies.  MEDICATIONS:  Current Facility-Administered Medications  Medication Dose Route Frequency Provider Last Rate Last Dose  . 0.9 %  sodium chloride infusion   Intravenous Continuous Cristal Ford, MD 75 mL/hr at 04/17/12 0125    . acetaminophen (TYLENOL) tablet 650 mg  650 mg Oral Q6H PRN Cristal Ford, MD       Or  . acetaminophen (TYLENOL) suppository 650 mg  650 mg Rectal Q6H PRN Cristal Ford, MD      . bisacodyl (DULCOLAX) suppository 10 mg  10 mg Rectal Daily PRN Cristal Ford, MD      . dexamethasone (DECADRON) injection 10 mg  10 mg Intravenous Q6H Cristal Ford, MD   10 mg at 04/17/12 1053   Followed by  . [START ON 04/18/2012] dexamethasone (DECADRON) tablet 4 mg  4 mg Oral Q6H Srikar Cherlynn Kaiser, MD      . docusate sodium (COLACE) capsule 100 mg  100 mg Oral BID Cristal Ford, MD      . enoxaparin (LOVENOX) injection 40 mg  40 mg Subcutaneous Q24H Cristal Ford, MD   40 mg at 04/17/12 1053  . hydrALAZINE (APRESOLINE) injection 10 mg  10 mg Intravenous Q6H PRN Cristal Ford, MD      . HYDROmorphone (DILAUDID)  injection 0.5-1 mg  0.5-1 mg Intravenous Q4H PRN Cristal Ford, MD   1 mg at 04/17/12 1053  . ondansetron (ZOFRAN) tablet 4 mg  4 mg Oral Q6H PRN Cristal Ford, MD       Or  . ondansetron (ZOFRAN) injection 4 mg  4 mg Intravenous Q6H PRN Cristal Ford, MD      . senna (SENOKOT) tablet 8.6 mg  1 tablet Oral BID Cristal Ford, MD        REVIEW OF SYSTEMS:  As above   PHYSICAL EXAM:  height is 5' 5.5" (1.664 m) and weight is 131 lb 6.3 oz (59.6 kg). Her oral temperature is 98.3 F (36.8 C). Her blood pressure is 155/74 and her pulse is 60. Her respiration is 16 and oxygen  saturation is 97%.   General: Alert and oriented to person, place (city/ county but not hospital), and alert to date and current president;  in no acute distress HEENT: Head is normocephalic. Pupils are equally round and reactive to light. Extraocular movements are intact. Oropharynx is clear. Neck: two palpable masses in L SCV region Heart: Regular in rate and rhythm with no murmurs, rubs, or gallops. Chest: Clear to auscultation bilaterally, with no rhonchi, wheezes, or rales. Abdomen: Soft, nontender, nondistended, with no rigidity or guarding. Extremities: No cyanosis or edema. Lymphatics: No concerning lymphadenopathy. Skin: No concerning lesions. Musculoskeletal: symmetric strength and muscle tone throughout. Neurologic: Cranial nerves II through XII are grossly intact. No obvious focalities. Speech is fluent. Coordination is intact. Psychiatric: Judgment and insight are intact. Affect is slightly restricted    LABORATORY DATA:  Lab Results  Component Value Date   WBC 7.4 04/17/2012   HGB 13.7 04/17/2012   HCT 42.4 04/17/2012   MCV 88.3 04/17/2012   PLT 419* 04/17/2012   CMP     Component Value Date/Time   NA 137 04/17/2012 0423   K 3.9 04/17/2012 0423   CL 102 04/17/2012 0423   CO2 27 04/17/2012 0423   GLUCOSE 142* 04/17/2012 0423   BUN 6 04/17/2012 0423   CREATININE 0.62 04/17/2012 0423   CALCIUM 9.1 04/17/2012 0423   GFRNONAA >90 04/17/2012 0423   GFRAA >90 04/17/2012 0423         RADIOGRAPHY: Dg Chest 2 View  04/16/2012  *RADIOLOGY REPORT*  Clinical Data: Chest pain.  Neck pain.  Tobacco use.  CHEST - 2 VIEW  Comparison: None.  Findings: Scattered pulmonary nodules and pulmonary masses are observed bilaterally.  An index mass in the right to middle lobe measures 5.4 x 3.2 by 5.2 cm.  Mass-like scarring noted along the left lung apex.  No cardiomegaly noted, but the aortic arch seems to be right-sided.  Unusual lucency at the T12-L1 level on the frontal projection may to be due to bowel gas  although a vertebral anomaly cannot be totally excluded.  No pleural effusion is identified.  IMPRESSION:  1.  Scattered nodules and masses throughout both lungs.  Appearance favors metastatic malignancy to both lungs.  Fungal pneumonia and rheumatoid nodules are less likely differential diagnostic considerations.  CT chest with contrast is recommended for further workup, and considering the bilaterality of disease, imaging of the abdomen and pelvis may be prudent as well. 2.  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 3.  Lucency at the thoracolumbar junction on the frontal projection is somewhat unusual but probably simply due to bowel gas.  This could be further characterized on the CT exam to  ensure that does not represent a vertebral anomaly.   Original Report Authenticated By: Gaylyn Rong, M.D.    Ct Head W Wo Contrast  04/16/2012  *RADIOLOGY REPORT*  Clinical Data: Forgetful; known lung mass.  Assess for brain metastases.  CT HEAD WITHOUT AND WITH CONTRAST  Technique:  Contiguous axial images were obtained from the base of the skull through the vertex without and with intravenous contrast.  Contrast: OMNIPAQUE IOHEXOL 300 MG/ML  SOLN  Comparison: None.  Findings: Evaluation is suboptimal due to motion artifact. However, multiple prominent areas of decreased attenuation are seen throughout the cerebral hemispheres and at the left side of the cerebellum, compatible with multiple metastases.  Identifiable masses measure perhaps 2.0 cm at the high left frontal lobe, 2.5 cm at the expected location of the frontal horn of the left lateral ventricle, 1.5 cm at the left occipital lobe, 1.5 cm at the left cerebellar hemisphere and 1.7 cm at the posterior right parietal lobe.  There is perhaps 7 mm of rightward midline shift, though this is difficult to fully characterize.  There is no evidence of acute infarction, or intra- or extra-axial hemorrhage on CT.  There is no evidence of fracture;  visualized osseous structures are unremarkable in appearance.  The visualized portions of the orbits are within normal limits.  The paranasal sinuses and mastoid air cells are well-aerated.  No significant soft tissue abnormalities are seen.  IMPRESSION:  1.  Multiple cerebral and cerebellar metastases noted, with extensive areas of decreased attenuation, effacement of the frontal horn of the left lateral ventricle and perhaps 7 mm of rightward midline shift. 2.  No definite evidence of intracranial hemorrhage.  These results were called by telephone on 04/16/2012 at 09:21 p.m. to Earley Favor PA, who verbally acknowledged these results.   Original Report Authenticated By: Tonia Ghent, M.D.    Ct Chest W Contrast  04/16/2012  *RADIOLOGY REPORT*  Clinical Data:  Assess for chest and abdominal metastatic disease; known lung masses.  CT CHEST, ABDOMEN AND PELVIS WITH CONTRAST  Technique:  Multidetector CT imaging of the chest, abdomen and pelvis was performed following the standard protocol during bolus administration of intravenous contrast.  Contrast: OMNIPAQUE IOHEXOL 300 MG/ML  SOLN  Comparison:   None.  CT CHEST  Findings:  There are two dominant masses within the lungs, with a third somewhat smaller mass also seen.  The largest mass is noted at the left upper lobe, with significant architectural distortion; it measures approximately 8.6 x 4.4 x 6.5 cm, with partial occlusion of the bronchioles to the left upper lobe, and marked mass effect on the left main pulmonary artery.  A large 6.2 x 3.7 x 3.9 cm mass is noted within the right middle lobe.  There is also a spiculated 3.1 x 3.1 x 2.4 cm mass at the right upper lobe, with significant architectural distortion.  Innumerable smaller metastases are noted throughout both lungs.  No pleural effusion is identified.  There is mild underlying emphysema, noted particularly at the right upper lobe.  No pneumothorax is seen.  Scattered coronary artery  calcifications are noted.  The patient's left upper lobe mass extends into the mediastinum, with irregular lobulations.  It remains a few millimeters from both the trachea and the descending thoracic aorta, though slightly closer to the course of the left subclavian artery.  Scattered metastases are noted within the mediastinum, demonstrating centrally decreased attenuation.  These are noted at the right paratracheal and periaortic regions, measuring  up to 1.3 cm in short axis.  No pericardial effusion is identified. Scattered calcific atherosclerotic disease is noted along the aortic arch.  The great vessels are grossly unremarkable in appearance.  Minimal hypodensity at the left thyroid lobe is nonspecific in appearance, and likely benign given its size.  An enlarged 1.4 cm left supraclavicular node is seen.  Scattered axillary nodes remain normal in size.  No acute osseous abnormalities are seen.  IMPRESSION:  1.  Two dominant masses within the lungs, at the left upper lobe and right middle lobe.  Smaller spiculated mass at the right upper lobe.  Significant associated architectural distortion; the left upper lobe mass extends into the mediastinum.  Partial occlusion of the bronchus to the left upper lobe, and significant mass effect on the left main pulmonary artery. 2.  Innumerable small metastases noted throughout both lungs. 3.  Mild underlying emphysema, particularly at the right upper lobe. 4.  Scattered coronary artery calcifications seen. 5.  Scattered metastases within the mediastinum, at the right paratracheal and periaortic regions, measuring up to 1.3 cm in short axis. 6.  1.4 cm centrally hypodense left supraclavicular node, concerning for metastatic disease. 7.  Minimal hypodensity at the left thyroid lobe is nonspecific, and likely benign given its size.  CT ABDOMEN AND PELVIS  Findings:  A few hypodensities within the liver are nonspecific but likely reflect small cysts.  The liver is otherwise  unremarkable in appearance.  Two vague hypodense lesions are noted within the spleen, measuring 2.0 and 1.5 cm, concerning for metastases.  There is a large complex heterogeneous left adrenal mass, measuring 4.6 cm in size, concerning for metastatic disease.  The right adrenal gland is unremarkable in appearance.  A large stone is noted within the gallbladder; the gallbladder is otherwise grossly unremarkable.  The pancreas is grossly unremarkable.  A nonobstructing 7 mm stone is noted at the interpole region of the left kidney.  Scattered bilateral renal cysts are seen, measuring up to 1.7 cm in size. There is no evidence of hydronephrosis.  No obstructing ureteral stones are seen.  No perinephric stranding is appreciated.  No free fluid is identified.  The small bowel is unremarkable in appearance.  The stomach is within normal limits.  No acute vascular abnormalities are seen.  Scattered calcification is noted along the abdominal aorta and its branches.  The appendix is normal in caliber, without evidence for appendicitis.  The colon is unremarkable in appearance.  The bladder is mildly distended and grossly unremarkable in appearance.  The patient is status post hysterectomy.  No suspicious adnexal masses are seen; the left ovary is unremarkable in appearance.  No inguinal lymphadenopathy is seen.  No acute osseous abnormalities are identified.  There is nonspecific sclerosis at the pubic symphysis, and vacuum phenomenon and mild sclerotic change are noted at the sacroiliac joints bilaterally.  Degenerative change is noted along the lower lumbar spine.  IMPRESSION:  1.  Large complex left adrenal mass, measuring 4.6 cm, concerning for metastatic disease. 2.  Two vague hypodensities within the spleen, concerning for metastatic disease. 3.  Small hepatic and renal hypodensities likely reflect cysts. 4.  Cholelithiasis; gallbladder otherwise unremarkable in appearance. 5.  Nonobstructing 7 mm stone at the interpole  region of the left kidney. 6.  Scattered calcification along the abdominal aorta and its branches. 7.  No definite evidence for osseous metastatic disease; nonspecific sclerosis at the pubic symphysis, and mild degenerative change along the lower lumbar spine and at the  sacroiliac joints.   Original Report Authenticated By: Tonia Ghent, M.D.    Ct Abdomen Pelvis W Contrast  04/16/2012  *RADIOLOGY REPORT*  Clinical Data:  Assess for chest and abdominal metastatic disease; known lung masses.  CT CHEST, ABDOMEN AND PELVIS WITH CONTRAST  Technique:  Multidetector CT imaging of the chest, abdomen and pelvis was performed following the standard protocol during bolus administration of intravenous contrast.  Contrast: OMNIPAQUE IOHEXOL 300 MG/ML  SOLN  Comparison:   None.  CT CHEST  Findings:  There are two dominant masses within the lungs, with a third somewhat smaller mass also seen.  The largest mass is noted at the left upper lobe, with significant architectural distortion; it measures approximately 8.6 x 4.4 x 6.5 cm, with partial occlusion of the bronchioles to the left upper lobe, and marked mass effect on the left main pulmonary artery.  A large 6.2 x 3.7 x 3.9 cm mass is noted within the right middle lobe.  There is also a spiculated 3.1 x 3.1 x 2.4 cm mass at the right upper lobe, with significant architectural distortion.  Innumerable smaller metastases are noted throughout both lungs.  No pleural effusion is identified.  There is mild underlying emphysema, noted particularly at the right upper lobe.  No pneumothorax is seen.  Scattered coronary artery calcifications are noted.  The patient's left upper lobe mass extends into the mediastinum, with irregular lobulations.  It remains a few millimeters from both the trachea and the descending thoracic aorta, though slightly closer to the course of the left subclavian artery.  Scattered metastases are noted within the mediastinum, demonstrating centrally  decreased attenuation.  These are noted at the right paratracheal and periaortic regions, measuring up to 1.3 cm in short axis.  No pericardial effusion is identified. Scattered calcific atherosclerotic disease is noted along the aortic arch.  The great vessels are grossly unremarkable in appearance.  Minimal hypodensity at the left thyroid lobe is nonspecific in appearance, and likely benign given its size.  An enlarged 1.4 cm left supraclavicular node is seen.  Scattered axillary nodes remain normal in size.  No acute osseous abnormalities are seen.  IMPRESSION:  1.  Two dominant masses within the lungs, at the left upper lobe and right middle lobe.  Smaller spiculated mass at the right upper lobe.  Significant associated architectural distortion; the left upper lobe mass extends into the mediastinum.  Partial occlusion of the bronchus to the left upper lobe, and significant mass effect on the left main pulmonary artery. 2.  Innumerable small metastases noted throughout both lungs. 3.  Mild underlying emphysema, particularly at the right upper lobe. 4.  Scattered coronary artery calcifications seen. 5.  Scattered metastases within the mediastinum, at the right paratracheal and periaortic regions, measuring up to 1.3 cm in short axis. 6.  1.4 cm centrally hypodense left supraclavicular node, concerning for metastatic disease. 7.  Minimal hypodensity at the left thyroid lobe is nonspecific, and likely benign given its size.  CT ABDOMEN AND PELVIS  Findings:  A few hypodensities within the liver are nonspecific but likely reflect small cysts.  The liver is otherwise unremarkable in appearance.  Two vague hypodense lesions are noted within the spleen, measuring 2.0 and 1.5 cm, concerning for metastases.  There is a large complex heterogeneous left adrenal mass, measuring 4.6 cm in size, concerning for metastatic disease.  The right adrenal gland is unremarkable in appearance.  A large stone is noted within the  gallbladder; the gallbladder  is otherwise grossly unremarkable.  The pancreas is grossly unremarkable.  A nonobstructing 7 mm stone is noted at the interpole region of the left kidney.  Scattered bilateral renal cysts are seen, measuring up to 1.7 cm in size. There is no evidence of hydronephrosis.  No obstructing ureteral stones are seen.  No perinephric stranding is appreciated.  No free fluid is identified.  The small bowel is unremarkable in appearance.  The stomach is within normal limits.  No acute vascular abnormalities are seen.  Scattered calcification is noted along the abdominal aorta and its branches.  The appendix is normal in caliber, without evidence for appendicitis.  The colon is unremarkable in appearance.  The bladder is mildly distended and grossly unremarkable in appearance.  The patient is status post hysterectomy.  No suspicious adnexal masses are seen; the left ovary is unremarkable in appearance.  No inguinal lymphadenopathy is seen.  No acute osseous abnormalities are identified.  There is nonspecific sclerosis at the pubic symphysis, and vacuum phenomenon and mild sclerotic change are noted at the sacroiliac joints bilaterally.  Degenerative change is noted along the lower lumbar spine.  IMPRESSION:  1.  Large complex left adrenal mass, measuring 4.6 cm, concerning for metastatic disease. 2.  Two vague hypodensities within the spleen, concerning for metastatic disease. 3.  Small hepatic and renal hypodensities likely reflect cysts. 4.  Cholelithiasis; gallbladder otherwise unremarkable in appearance. 5.  Nonobstructing 7 mm stone at the interpole region of the left kidney. 6.  Scattered calcification along the abdominal aorta and its branches. 7.  No definite evidence for osseous metastatic disease; nonspecific sclerosis at the pubic symphysis, and mild degenerative change along the lower lumbar spine and at the sacroiliac joints.   Original Report Authenticated By: Tonia Ghent, M.D.        IMPRESSION/PLAN: Is a lovely 67 year old woman with multiple brain masses. At least 5 are visible on the CT scan. Symptomatically she is improved with Decadron. This is putative metastatic lung cancer. Biopsies are pending.  I recommended whole brain radiotherapy over 2-3 weeks to palliate her disease. I would recommend obtaining tissues as soon as possible to prove a cancer diagnosis. In the meantime we can proceed with treatment planning. I would hold radiotherapy until the results are available from her path.  It was a pleasure meeting the patient today. We discussed the risks, benefits, and side effects of whole brain radiotherapy including but not limited to fatigue, hair loss, and long term cognitive effects. No guarantees of treatment were given. A consent form was signed and placed in the patient's medical record. The patient is enthusiastic about proceeding with treatment. I look forward to participating in the patient's care. I left a phone message for the patient's daughter, Isabella Carpenter, to discuss this further with her so she is aware of her mother's disposition.    I spent 25 minutes minutes face to face with the patient and more than 50% of that time was spent in counseling and/or coordination of care.    __________________________________________   Lonie Peak, MD

## 2012-04-17 NOTE — Progress Notes (Signed)
[facility] PROGRESS NOTE  Isabella Carpenter WUJ:[id number] DOB: [birth year] DOA: 04/16/2012 PCP: No PCP Per Patient  Assessment/Plan: Brief HPI:  Isabella Carpenter is a 67 y.o. Caucasian female with no significant past medical history and has not seen a physician for a long time who was brought in by family for the above reasons. Over the last week family has noted that she has had intermittent episodes of confusion. Per daughter, on 04/14/2012, patient appeared confused on the telephone and had only short one-word yes or no responses. On arrival to ED, she had imaging of her brain showed metastatic disease in her brain, chest, and abdomen. Hospitalist service was consulted for further care and management. Surgery consulted for supraclavicular node biopsy.     Altered mental status : appears to have resolved. Probably from brain metastases. 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 Discussed with Dr. Jordan Likes, neurosurgery, given patient's extensive metastatic disease did not think he could offer the patient anything from a neurosurgical standpoint as she does not have any deficits at this time. Dr. Jordan Likes, recommended dexamethasone 10 mg IV every 6 hours for 4 doses then dexamethasone 4 mg by mouth every 6 hours and recommended whole brain radiation. Discussed with Dr. Kathrynn Running, radiation oncology, who will evaluate the patient in the morning. Chest pain  Initial troponin negative. EKG not concerning for any ischemic changes. Continue to monitor on telemetry, cycle cardiac enzymes to rule patient out for acute coronary syndrome. Suspect patient's chest pain is likely related to metastatic disease in her lungs as imaging showed 2 dominant mass is in the left upper and right middle lobe. On the left there is a partial occlusion of the bronchus to the left upper lobe with significant mass effect on  the left main pulmonary artery.   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 Patient has palpable left supraclavicular lymph nodes which may be easily biopsied. General surgery called for biopsy of the supraclavicular LN. . Once pathological diagnosis is obtained, may benefit from oncology evaluation.   Elevated blood pressure  Likely related to pain and anxiety. Continue to monitor. When necessary hydralazine for systolic blood pressure greater than 160.  Tobacco abuse  Counseled the patient on cessation.  Prophylaxis  Lovenox.     Code Status: DNR Family Communication: DAUGHTER at bedside Disposition Plan: pending.    Consultants:   general surgery  HPI/Subjective: Comfortable, no new complaints.   Objective: Filed Vitals:   04/16/12 2315 04/17/12 0000 04/17/12 0038 04/17/12 0555  BP: 174/87  181/86 155/74  Pulse: 66 61 63 60  Temp:   97.8 F (36.6 C) 98.3 F (36.8 C)  TempSrc:   Oral Oral  Resp: 18 22 22 16   Height:   5' 5.5" (1.664 m)   Weight:   59.6 kg (131 lb 6.3 oz)   SpO2: 94% 98% 98% 97%    Intake/Output Summary (Last 24 hours) at 04/17/12 0906 Last data filed at 04/17/12 0839  Gross per 24 hour  Intake 538.75 ml  Output    900 ml  Net -361.25 ml   Filed Weights   04/17/12 0038  Weight: 59.6 kg (131 lb 6.3 oz)    Exam: Alert afebrile comfortable, oriented to place person and time.  CV: S1 and S2  Lungs: Clear to ascultation bilaterally  Abdomen: Soft, Nontender, Nondistended, +bowel sounds.  Ext: Good pulses. Trace edema. No clubbing or cyanosis noted.  Neuro: Cranial Nerves II-XII grossly intact. Has 5/5 motor strength in upper and lower extremities.  Chest: No reproducible pain with palpation.   Data Reviewed: Basic Metabolic Panel:  Recent Labs Lab 04/16/12 1924 04/17/12 0423  NA 138 137  K 3.5 3.9  CL 102 102  CO2 28 27  GLUCOSE 96  142*  BUN 8 6  CREATININE 0.71 0.62  CALCIUM 9.5 9.1   Liver Function Tests: No results found for this basename: AST, ALT, ALKPHOS, BILITOT, PROT, ALBUMIN,  in the last 168 hours No results found for this basename: LIPASE, AMYLASE,  in the last 168 hours No results found for this basename: AMMONIA,  in the last 168 hours CBC:  Recent Labs Lab 04/16/12 1924 04/17/12 0423  WBC 8.6 7.4  HGB 14.6 13.7  HCT 41.7 42.4  MCV 86.2 88.3  PLT 380 419*   Cardiac Enzymes:  Recent Labs Lab 04/16/12 1924 04/16/12 2340  TROPONINI <0.30 <0.30   BNP (last 3 results) No results found for this basename: PROBNP,  in the last 8760 hours CBG:  Recent Labs Lab 04/17/12 0047 04/17/12 0752  GLUCAP 122* 136*    No results found for this or any previous visit (from the past 240 hour(s)).   Studies: Dg Chest 2 View  04/16/2012  *RADIOLOGY REPORT*  Clinical Data: Chest pain.  Neck pain.  Tobacco use.  CHEST - 2 VIEW  Comparison: None.  Findings: Scattered pulmonary nodules and pulmonary masses are observed bilaterally.  An index mass in the right to middle lobe measures 5.4 x 3.2 by 5.2 cm.  Mass-like scarring noted along the left lung apex.  No cardiomegaly noted, but the aortic arch seems to be right-sided.  Unusual lucency at the T12-L1 level on the frontal projection may to be due to bowel gas although a vertebral anomaly cannot be totally excluded.  No pleural effusion is identified.  IMPRESSION:  1.  Scattered nodules and masses throughout both lungs.  Appearance favors metastatic malignancy to both lungs.  Fungal pneumonia and rheumatoid nodules are less likely differential diagnostic considerations.  CT chest with contrast is recommended for further workup, and considering the bilaterality of disease, imaging of the abdomen and pelvis may be prudent as well. 2.  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 3.  Lucency at the thoracolumbar junction on the frontal projection is  somewhat unusual but probably simply due to bowel gas.  This could be further characterized on the CT exam to ensure that does not represent a vertebral anomaly.   Original Report Authenticated By: Gaylyn Rong, M.D.    Ct Head W Wo Contrast  04/16/2012  *RADIOLOGY REPORT*  Clinical Data: Forgetful; known lung mass.  Assess for brain metastases.  CT HEAD WITHOUT AND WITH CONTRAST  Technique:  Contiguous axial images were obtained from the base of the skull through the vertex without and with intravenous contrast.  Contrast: OMNIPAQUE IOHEXOL 300 MG/ML  SOLN  Comparison: None.  Findings: Evaluation is suboptimal due to motion artifact. However, multiple prominent areas of decreased attenuation are seen throughout the cerebral hemispheres and at the left side of the cerebellum, compatible with multiple metastases.  Identifiable masses measure perhaps 2.0 cm at the high left frontal lobe, 2.5 cm at the expected location of the frontal horn of the left lateral ventricle, 1.5 cm at the left occipital lobe, 1.5 cm at the  left cerebellar hemisphere and 1.7 cm at the posterior right parietal lobe.  There is perhaps 7 mm of rightward midline shift, though this is difficult to fully characterize.  There is no evidence of acute infarction, or intra- or extra-axial hemorrhage on CT.  There is no evidence of fracture; visualized osseous structures are unremarkable in appearance.  The visualized portions of the orbits are within normal limits.  The paranasal sinuses and mastoid air cells are well-aerated.  No significant soft tissue abnormalities are seen.  IMPRESSION:  1.  Multiple cerebral and cerebellar metastases noted, with extensive areas of decreased attenuation, effacement of the frontal horn of the left lateral ventricle and perhaps 7 mm of rightward midline shift. 2.  No definite evidence of intracranial hemorrhage.  These results were called by telephone on 04/16/2012 at 09:21 p.m. to Earley Favor PA, who  verbally acknowledged these results.   Original Report Authenticated By: Tonia Ghent, M.D.    Ct Chest W Contrast  04/16/2012  *RADIOLOGY REPORT*  Clinical Data:  Assess for chest and abdominal metastatic disease; known lung masses.  CT CHEST, ABDOMEN AND PELVIS WITH CONTRAST  Technique:  Multidetector CT imaging of the chest, abdomen and pelvis was performed following the standard protocol during bolus administration of intravenous contrast.  Contrast: OMNIPAQUE IOHEXOL 300 MG/ML  SOLN  Comparison:   None.  CT CHEST  Findings:  There are two dominant masses within the lungs, with a third somewhat smaller mass also seen.  The largest mass is noted at the left upper lobe, with significant architectural distortion; it measures approximately 8.6 x 4.4 x 6.5 cm, with partial occlusion of the bronchioles to the left upper lobe, and marked mass effect on the left main pulmonary artery.  A large 6.2 x 3.7 x 3.9 cm mass is noted within the right middle lobe.  There is also a spiculated 3.1 x 3.1 x 2.4 cm mass at the right upper lobe, with significant architectural distortion.  Innumerable smaller metastases are noted throughout both lungs.  No pleural effusion is identified.  There is mild underlying emphysema, noted particularly at the right upper lobe.  No pneumothorax is seen.  Scattered coronary artery calcifications are noted.  The patient's left upper lobe mass extends into the mediastinum, with irregular lobulations.  It remains a few millimeters from both the trachea and the descending thoracic aorta, though slightly closer to the course of the left subclavian artery.  Scattered metastases are noted within the mediastinum, demonstrating centrally decreased attenuation.  These are noted at the right paratracheal and periaortic regions, measuring up to 1.3 cm in short axis.  No pericardial effusion is identified. Scattered calcific atherosclerotic disease is noted along the aortic arch.  The great vessels  are grossly unremarkable in appearance.  Minimal hypodensity at the left thyroid lobe is nonspecific in appearance, and likely benign given its size.  An enlarged 1.4 cm left supraclavicular node is seen.  Scattered axillary nodes remain normal in size.  No acute osseous abnormalities are seen.  IMPRESSION:  1.  Two dominant masses within the lungs, at the left upper lobe and right middle lobe.  Smaller spiculated mass at the right upper lobe.  Significant associated architectural distortion; the left upper lobe mass extends into the mediastinum.  Partial occlusion of the bronchus to the left upper lobe, and significant mass effect on the left main pulmonary artery. 2.  Innumerable small metastases noted throughout both lungs. 3.  Mild underlying emphysema, particularly at the  right upper lobe. 4.  Scattered coronary artery calcifications seen. 5.  Scattered metastases within the mediastinum, at the right paratracheal and periaortic regions, measuring up to 1.3 cm in short axis. 6.  1.4 cm centrally hypodense left supraclavicular node, concerning for metastatic disease. 7.  Minimal hypodensity at the left thyroid lobe is nonspecific, and likely benign given its size.  CT ABDOMEN AND PELVIS  Findings:  A few hypodensities within the liver are nonspecific but likely reflect small cysts.  The liver is otherwise unremarkable in appearance.  Two vague hypodense lesions are noted within the spleen, measuring 2.0 and 1.5 cm, concerning for metastases.  There is a large complex heterogeneous left adrenal mass, measuring 4.6 cm in size, concerning for metastatic disease.  The right adrenal gland is unremarkable in appearance.  A large stone is noted within the gallbladder; the gallbladder is otherwise grossly unremarkable.  The pancreas is grossly unremarkable.  A nonobstructing 7 mm stone is noted at the interpole region of the left kidney.  Scattered bilateral renal cysts are seen, measuring up to 1.7 cm in size. There is  no evidence of hydronephrosis.  No obstructing ureteral stones are seen.  No perinephric stranding is appreciated.  No free fluid is identified.  The small bowel is unremarkable in appearance.  The stomach is within normal limits.  No acute vascular abnormalities are seen.  Scattered calcification is noted along the abdominal aorta and its branches.  The appendix is normal in caliber, without evidence for appendicitis.  The colon is unremarkable in appearance.  The bladder is mildly distended and grossly unremarkable in appearance.  The patient is status post hysterectomy.  No suspicious adnexal masses are seen; the left ovary is unremarkable in appearance.  No inguinal lymphadenopathy is seen.  No acute osseous abnormalities are identified.  There is nonspecific sclerosis at the pubic symphysis, and vacuum phenomenon and mild sclerotic change are noted at the sacroiliac joints bilaterally.  Degenerative change is noted along the lower lumbar spine.  IMPRESSION:  1.  Large complex left adrenal mass, measuring 4.6 cm, concerning for metastatic disease. 2.  Two vague hypodensities within the spleen, concerning for metastatic disease. 3.  Small hepatic and renal hypodensities likely reflect cysts. 4.  Cholelithiasis; gallbladder otherwise unremarkable in appearance. 5.  Nonobstructing 7 mm stone at the interpole region of the left kidney. 6.  Scattered calcification along the abdominal aorta and its branches. 7.  No definite evidence for osseous metastatic disease; nonspecific sclerosis at the pubic symphysis, and mild degenerative change along the lower lumbar spine and at the sacroiliac joints.   Original Report Authenticated By: Tonia Ghent, M.D.    Ct Abdomen Pelvis W Contrast  04/16/2012  *RADIOLOGY REPORT*  Clinical Data:  Assess for chest and abdominal metastatic disease; known lung masses.  CT CHEST, ABDOMEN AND PELVIS WITH CONTRAST  Technique:  Multidetector CT imaging of the chest, abdomen and pelvis was  performed following the standard protocol during bolus administration of intravenous contrast.  Contrast: OMNIPAQUE IOHEXOL 300 MG/ML  SOLN  Comparison:   None.  CT CHEST  Findings:  There are two dominant masses within the lungs, with a third somewhat smaller mass also seen.  The largest mass is noted at the left upper lobe, with significant architectural distortion; it measures approximately 8.6 x 4.4 x 6.5 cm, with partial occlusion of the bronchioles to the left upper lobe, and marked mass effect on the left main pulmonary artery.  A large 6.2 x  3.7 x 3.9 cm mass is noted within the right middle lobe.  There is also a spiculated 3.1 x 3.1 x 2.4 cm mass at the right upper lobe, with significant architectural distortion.  Innumerable smaller metastases are noted throughout both lungs.  No pleural effusion is identified.  There is mild underlying emphysema, noted particularly at the right upper lobe.  No pneumothorax is seen.  Scattered coronary artery calcifications are noted.  The patient's left upper lobe mass extends into the mediastinum, with irregular lobulations.  It remains a few millimeters from both the trachea and the descending thoracic aorta, though slightly closer to the course of the left subclavian artery.  Scattered metastases are noted within the mediastinum, demonstrating centrally decreased attenuation.  These are noted at the right paratracheal and periaortic regions, measuring up to 1.3 cm in short axis.  No pericardial effusion is identified. Scattered calcific atherosclerotic disease is noted along the aortic arch.  The great vessels are grossly unremarkable in appearance.  Minimal hypodensity at the left thyroid lobe is nonspecific in appearance, and likely benign given its size.  An enlarged 1.4 cm left supraclavicular node is seen.  Scattered axillary nodes remain normal in size.  No acute osseous abnormalities are seen.  IMPRESSION:  1.  Two dominant masses within the lungs, at the  left upper lobe and right middle lobe.  Smaller spiculated mass at the right upper lobe.  Significant associated architectural distortion; the left upper lobe mass extends into the mediastinum.  Partial occlusion of the bronchus to the left upper lobe, and significant mass effect on the left main pulmonary artery. 2.  Innumerable small metastases noted throughout both lungs. 3.  Mild underlying emphysema, particularly at the right upper lobe. 4.  Scattered coronary artery calcifications seen. 5.  Scattered metastases within the mediastinum, at the right paratracheal and periaortic regions, measuring up to 1.3 cm in short axis. 6.  1.4 cm centrally hypodense left supraclavicular node, concerning for metastatic disease. 7.  Minimal hypodensity at the left thyroid lobe is nonspecific, and likely benign given its size.  CT ABDOMEN AND PELVIS  Findings:  A few hypodensities within the liver are nonspecific but likely reflect small cysts.  The liver is otherwise unremarkable in appearance.  Two vague hypodense lesions are noted within the spleen, measuring 2.0 and 1.5 cm, concerning for metastases.  There is a large complex heterogeneous left adrenal mass, measuring 4.6 cm in size, concerning for metastatic disease.  The right adrenal gland is unremarkable in appearance.  A large stone is noted within the gallbladder; the gallbladder is otherwise grossly unremarkable.  The pancreas is grossly unremarkable.  A nonobstructing 7 mm stone is noted at the interpole region of the left kidney.  Scattered bilateral renal cysts are seen, measuring up to 1.7 cm in size. There is no evidence of hydronephrosis.  No obstructing ureteral stones are seen.  No perinephric stranding is appreciated.  No free fluid is identified.  The small bowel is unremarkable in appearance.  The stomach is within normal limits.  No acute vascular abnormalities are seen.  Scattered calcification is noted along the abdominal aorta and its branches.  The  appendix is normal in caliber, without evidence for appendicitis.  The colon is unremarkable in appearance.  The bladder is mildly distended and grossly unremarkable in appearance.  The patient is status post hysterectomy.  No suspicious adnexal masses are seen; the left ovary is unremarkable in appearance.  No inguinal lymphadenopathy is seen.  No acute osseous abnormalities are identified.  There is nonspecific sclerosis at the pubic symphysis, and vacuum phenomenon and mild sclerotic change are noted at the sacroiliac joints bilaterally.  Degenerative change is noted along the lower lumbar spine.  IMPRESSION:  1.  Large complex left adrenal mass, measuring 4.6 cm, concerning for metastatic disease. 2.  Two vague hypodensities within the spleen, concerning for metastatic disease. 3.  Small hepatic and renal hypodensities likely reflect cysts. 4.  Cholelithiasis; gallbladder otherwise unremarkable in appearance. 5.  Nonobstructing 7 mm stone at the interpole region of the left kidney. 6.  Scattered calcification along the abdominal aorta and its branches. 7.  No definite evidence for osseous metastatic disease; nonspecific sclerosis at the pubic symphysis, and mild degenerative change along the lower lumbar spine and at the sacroiliac joints.   Original Report Authenticated By: Tonia Ghent, M.D.     Scheduled Meds: . dexamethasone  10 mg Intravenous Q6H   Followed by  . [START ON 04/18/2012] dexamethasone  4 mg Oral Q6H  . docusate sodium  100 mg Oral BID  . enoxaparin (LOVENOX) injection  40 mg Subcutaneous Q24H  . senna  1 tablet Oral BID   Continuous Infusions: . sodium chloride 75 mL/hr at 04/17/12 0125    Principal Problem:   Acute delirium Active Problems:   Chest pain   Tobacco abuse   Elevated blood pressure   Metastasis of unknown primary        [facility]  [facility] Pager [phone]. If 7PM-7AM, please contact night-coverage at [email or web address], password  [facility] 04/17/2012, 9:06 AM  LOS: 1 day

## 2012-04-17 NOTE — Evaluation (Signed)
Occupational Therapy Evaluation Patient Details Name: Isabella Carpenter MRN: [id number] DOB: 1945/12/26 Today's Date: 04/17/2012 Time: 7829-5621 OT Time Calculation (min): 19 min  OT Assessment / Plan / Recommendation Clinical Impression  Pt presents to OT s/p admit with acute delirium. Pt will benefit from skilled OT to increase I with ADL activity and decrease burden on daugther    OT Assessment  Patient needs continued OT Services    Follow Up Recommendations  Home health OT       Equipment Recommendations  None recommended by OT       Frequency  Min 3X/week    Precautions / Restrictions Precautions Precautions: Fall Precaution Comments: Monitor O2 Restrictions Weight Bearing Restrictions: No       ADL  Grooming: Simulated;Wash/dry face Where Assessed - Grooming: Unsupported standing Upper Body Bathing: Performed;Minimal assistance Where Assessed - Upper Body Bathing: Unsupported sitting Lower Body Bathing: Simulated;Moderate assistance Where Assessed - Lower Body Bathing: Unsupported sit to stand Upper Body Dressing: Simulated;Minimal assistance Where Assessed - Upper Body Dressing: Unsupported sitting Lower Body Dressing: Performed;Minimal assistance Where Assessed - Lower Body Dressing: Supported sit to stand Toilet Transfer: Simulated;Minimal assistance Toilet Transfer Method: Sit to Barista: Comfort height toilet Toileting - Clothing Manipulation and Hygiene: Performed;Minimal assistance Where Assessed - Glass blower/designer Manipulation and Hygiene: Standing    OT Diagnosis: Generalized weakness  OT Problem List: Decreased strength;Decreased activity tolerance;Impaired balance (sitting and/or standing) OT Treatment Interventions: Self-care/ADL training;Patient/family education   OT Goals Acute Rehab OT Goals OT Goal Formulation: With patient Time For Goal Achievement: 05/01/12 Potential to Achieve Goals: Good ADL Goals Pt Will Perform  Grooming: with supervision;Standing at sink ADL Goal: Grooming - Progress: Goal set today Pt Will Perform Lower Body Dressing: with supervision;Sit to stand from chair ADL Goal: Lower Body Dressing - Progress: Goal set today Pt Will Transfer to Toilet: with supervision;Comfort height toilet ADL Goal: Toilet Transfer - Progress: Goal set today Pt Will Perform Toileting - Clothing Manipulation: with supervision;Standing ADL Goal: Toileting - Clothing Manipulation - Progress: Goal set today Pt Will Perform Toileting - Hygiene: with supervision ADL Goal: Toileting - Hygiene - Progress: Goal set today Pt Will Perform Tub/Shower Transfer: Tub transfer;with supervision ADL Goal: Tub/Shower Transfer - Progress: Goal set today  Visit Information  Assistance Needed: +2 (if amb)       Prior Functioning     Home Living Lives With: Alone Available Help at Discharge: Family Type of Home: House Home Access: Stairs to enter Secretary/administrator of Steps: 1 Entrance Stairs-Rails: None Home Layout: Two level;Able to live on main level with bedroom/bathroom Bathroom Shower/Tub: Tub/shower unit Home Adaptive Equipment: None Prior Function Level of Independence: Independent Able to Take Stairs?: Yes Driving: Yes Vocation: Full time employment Comments: Production designer, 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     Extremity/Trunk Assessment Right Upper Extremity Assessment RUE  ROM/Strength/Tone: WFL for tasks assessed Left Upper Extremity Assessment LUE ROM/Strength/Tone: WFL for tasks assessed     Mobility Bed Mobility Bed Mobility: Supine to Sit Supine to Sit: 4: Min assist Details for  Bed Mobility Assistance: some assist for trunk for safety as getting to EOB with min cues for technique.  Transfers Sit to Stand: 4: Min assist;With upper extremity assist;From bed Stand to Sit: 4: Min assist;With upper extremity assist;With armrests;To chair/3-in-1 Details for Transfer Assistance: Assist to rise and steady as pt had to lean against bed for balance and also was leaning to right side.  Cues for hand placement and safety.            End of Session OT - End of Session Equipment Utilized During Treatment: Gait belt Activity Tolerance: Patient tolerated treatment well Patient left: in chair;with call bell/phone within reach;with family/visitor present  GO     Alba Cory 04/17/2012, 9:39 AM

## 2012-04-17 NOTE — Progress Notes (Signed)
INITIAL NUTRITION ASSESSMENT  DOCUMENTATION CODES Per approved criteria  -Not Applicable   INTERVENTION: Provide Ensure BID between meals Provide Multivitamin with minerals daily Recommend liberalizing diet to regular  NUTRITION DIAGNOSIS: Inadequate oral intake related to decreased appetite as evidenced by 7% wt loss in 3 weeks per pt.   Goal: Pt to meet >/= 90% of their estimated nutrition needs  Monitor:  Po intake Wt labs  Reason for Assessment: MST  67 y.o. female  Admitting Dx: Acute delirium  ASSESSMENT: 67 y.o. female who is admitted yesterday to the emergency room with complaints of chest pain and altered mental status. She has a history of 60 pack year smoking. Upon admission, the patient underwent CT scans of her head abdomen and chest. These scans are suspicious two dominant masses in the chest and for multiple nodules in the lungs consistent with metastases. She has mediastinal and left supraclavicular adenopathy. 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 Her brain demonstrated multiple masses on CT scan with surrounding edema, consistent with metastases.  Pt reports that she usually weighs 141 lbs but has lost weigh due to having a decreased appetite for the past 3 weeks. Pt states that for the past 3 weeks she has been eating either nothing or up to 2 small meals daily. Pt has been eating about 50% of meals since admission. Pt states she does not follow any diet and drinks mostly sweet tea at home. Encouraged pt to order 3 meals daily and aim to consume >75%.   Height: Ht Readings from Last 1 Encounters:  04/17/12 5' 5.5" (1.664 m)    Weight: Wt Readings from Last 1 Encounters:  04/17/12 131 lb 6.3 oz (59.6 kg)    Ideal Body Weight: 128 lbs  % Ideal Body Weight: 102%  Wt Readings from Last 10 Encounters:  04/17/12 131 lb 6.3 oz (59.6 kg)    Usual Body Weight: 141 lbs  % Usual Body Weight: 93 %  BMI:  Body mass index is 21.52  kg/(m^2).  Estimated Nutritional Needs: Kcal: 1715-1960 Protein: 83-95 grams Fluid: 2.1 L  Skin: WDL  Nutrition Focused Physical Exam:  Subcutaneous Fat:  Orbital Region: wnl Upper Arm Region: mild wasting Thoracic and Lumbar Region: wnl  Muscle:  Temple Region: mild wasting Clavicle Bone Region: mild wasting Clavicle and Acromion Bone Region: wnl Scapular Bone Region: NA Dorsal Hand: wnl Patellar Region: wnl Anterior Thigh Region: wnl Posterior Calf Region: wnl  Edema: not present   Diet Order: Cardiac  EDUCATION NEEDS: -No education needs identified at this time   Intake/Output Summary (Last 24 hours) at 04/17/12 1512 Last data filed at 04/17/12 1449  Gross per 24 hour  Intake   1365 ml  Output   1200 ml  Net    165 ml    Last BM: 4/6  Labs:   Recent Labs Lab 04/16/12 1924 04/17/12 0423  NA 138 137  K 3.5 3.9  CL 102 102  CO2 28 27  BUN 8 6  CREATININE 0.71 0.62  CALCIUM 9.5 9.1  GLUCOSE 96 142*    CBG (last 3)   Recent Labs  04/17/12 0047 04/17/12 0752 04/17/12 1141  GLUCAP 122* 136* 131*    Scheduled Meds: . dexamethasone  10 mg Intravenous Q6H   Followed by  . [START ON 04/18/2012] dexamethasone  4 mg Oral Q6H  . docusate sodium  100 mg Oral BID  . enoxaparin (LOVENOX) injection  40 mg Subcutaneous Q24H  .  senna  1 tablet Oral BID    Continuous Infusions: . sodium chloride 75 mL/hr at 04/17/12 0125    Past Medical History  Diagnosis Date  . Cancer     Past Surgical History  Procedure Laterality Date  . Abdominal hysterectomy      Ian Malkin RD, LDN Inpatient Clinical Dietitian Pager: [phone] After Hours Pager: [phone]

## 2012-04-17 NOTE — Evaluation (Signed)
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  Tolerated OOB and ambulation in hallway well with RW, however did notice some balance/stability deficits.  Pt will benefti from skilled PT in acute venue to address deficits.  PT recommends HHPT for follow up and with 24/7 care/assist initially.     PT Assessment  Patient needs continued PT services    Follow Up Recommendations  Home health PT;Supervision/Assistance - 24 hour    Does the patient have the potential to tolerate intense rehabilitation      Barriers to Discharge None daughter states she can stay with her.     Equipment Recommendations  Rolling walker with 5" wheels    Recommendations for Other Services     Frequency Min 3X/week    Precautions / Restrictions Precautions Precautions: Fall Precaution Comments: Monitor O2 Restrictions Weight Bearing Restrictions: No   Pertinent Vitals/Pain Pt states some pain in L shoulder      Mobility  Bed Mobility Bed Mobility: Supine to Sit Supine to Sit: 4: Min assist Details for Bed Mobility Assistance: some assist for trunk for safety as getting to EOB with min cues for technique.  Transfers Transfers: Sit to Stand;Stand to Sit Sit to Stand: 4: Min assist;With upper extremity assist;From bed Stand to Sit: 4: Min assist;With upper extremity assist;With armrests;To chair/3-in-1 Details for Transfer Assistance: Assist to rise and steady as pt had to lean against bed for balance and also was leaning to right side.  Cues for hand placement and safety.  Ambulation/Gait Ambulation/Gait Assistance: 4: Min assist Ambulation Distance (Feet): 450 Feet Assistive device: Rolling walker Ambulation/Gait Assistance Details:  Initially used no AD from bed to door, however noticed that pt very unstable and grabbed for furniture for balance, therefore transitioned to RW for support.  Noted marked improvement in balance and stability.  Ambulated on RA with SaO2 at 96% prior to amb and 92% during amb.   Gait Pattern: Step-through pattern;Decreased stride length Gait velocity: decreased Stairs: No Wheelchair Mobility Wheelchair Mobility: No    Exercises     PT Diagnosis: Difficulty walking;Generalized weakness  PT Problem List: Decreased strength;Decreased activity tolerance;Decreased balance;Decreased mobility;Decreased knowledge of use of DME;Decreased knowledge of precautions;Cardiopulmonary status limiting activity PT Treatment Interventions: DME instruction;Gait training;Functional mobility training;Therapeutic activities;Therapeutic exercise;Balance training;Patient/family education   PT Goals Acute Rehab PT Goals PT Goal Formulation: With patient/family Time For Goal Achievement: 05/01/12 Potential to Achieve Goals: Good Pt will go Supine/Side to Sit: with modified independence PT Goal: Supine/Side to Sit - Progress: Goal set today Pt will go Sit to Supine/Side: with modified independence PT Goal: Sit to Supine/Side - Progress: Goal set today Pt will go Sit to Stand: with modified independence PT Goal: Sit to Stand - Progress: Goal set today Pt will Ambulate: >150 feet;with modified independence;with least restrictive assistive device PT Goal: Ambulate - Progress: Goal set today Pt will Perform Home Exercise Program: with supervision, verbal cues required/provided PT Goal: Perform Home Exercise Program - Progress: Goal set today  Visit Information  Last PT Received On: 04/17/12 Assistance Needed: +1    Subjective Data  Subjective: My bottom hurts.  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     Extremity/Trunk Assessment Right Upper Extremity Assessment RUE ROM/Strength/Tone: [facility] for tasks assessed Left Upper Extremity Assessment LUE ROM/Strength/Tone: WFL for tasks assessed   Balance    End of Session PT - End of Session Equipment Utilized During Treatment: Gait belt;Oxygen Activity Tolerance: Patient tolerated treatment well Patient left: in chair;with call bell/phone within reach;with family/visitor present Nurse Communication: Mobility status  GP     Vista Deck 04/17/2012, 9:40 AM

## 2012-04-17 NOTE — Consult Note (Signed)
Reason for Consult: Metastatic cancer of uncertain etiology Looking for source. Referring Physician: Dr. Kathlen Mody  Isabella Carpenter is an 67 y.o. female.  HPI: Patient is a 67 year old female who was brought to the ER at [facility] yesterday for evaluation. Some coworkers had sent information by FaceBook to her daughter is indicating she was having some problems with confusion.  Workup in the ER was with chest x-ray showing scattered nodules and masses throughout both lungs. CT of the head shows multiple cerebellar and cerebral metastasis.  Decreased attenuation effacement of the the left lateral ventricle, and 7 mm rightward midline shift. CT of the lung shows: 2 dominant masses within the lung at the left upper lobe and the right middle lobe. There is a small spiculated mass in the right upper lobe. There is partial occlusion of the bronchus to the left lobe with significant mass effect on the left main pulmonary artery. There were multiple  small metastasis throughout both lungs with mild emphysema especially in the right upper lobe. There is a 1.4 cm hypodense area in the left supraclavicular node which would appear to be metastasis also. CT of the abdomen and pelvis shows:  a 4.6 cm large complex adrenal mass on the left. Two vague hypodensities within the spleen, concerning for  metastatic disease. No evidence for osseous metastatic disease. We are ask to see for Bx of Left supraclavicular node.   Past Medical History  Diagnosis Date  . Cancer Tobacco use for 30-34 years.     Past Surgical History  Procedure Laterality Date  . Abdominal hysterectomy      Family History  Problem Relation Age of Onset  . Heart attack Father   . Kidney cancer Father     Social History:  reports that she has been smoking Cigarettes.  She has a 60 pack-year smoking history. She does not have any smokeless tobacco history on file. Her alcohol and drug histories are not on file.  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 ibuprofen (ADVIL,MOTRIN) 200 MG tablet Take 200 mg by mouth every 6 (six) hours as needed for pain. For pain       Scheduled: . dexamethasone  10 mg Intravenous Q6H   Followed by  . [START ON 04/18/2012] dexamethasone  4 mg Oral Q6H  . docusate sodium  100 mg Oral BID  . enoxaparin (LOVENOX) injection  40 mg Subcutaneous Q24H  . senna  1 tablet Oral BID   Continuous: . sodium chloride 75 mL/hr at 04/17/12 0125   ZOX:[id number], acetaminophen, bisacodyl, hydrALAZINE, HYDROmorphone (DILAUDID) injection, ondansetron (ZOFRAN) IV, ondansetron Anti-infectives   None      Results for orders placed during the hospital encounter of 04/16/12 (from the past 48 hour(s))  CBC     Status: None   Collection Time    04/16/12  7:24 PM      Result Value Range   WBC 8.6  4.0 - 10.5 K/uL   RBC 4.84  3.87 - 5.11 MIL/uL   Hemoglobin 14.6  12.0 - 15.0 g/dL   HCT 14.7  82.9 - 56.2 %   MCV 86.2  78.0 - 100.0 fL   MCH 30.2  26.0 - 34.0 pg   MCHC 35.0  30.0 - 36.0 g/dL  RDW 14.5  11.5 - 15.5 %   Platelets 380  150 - 400 K/uL  BASIC METABOLIC PANEL     Status: Abnormal   Collection Time    04/16/12  7:24 PM      Result Value Range   Sodium 138  135 - 145 mEq/L   Potassium 3.5  3.5 - 5.1 mEq/L   Chloride 102  96 - 112 mEq/L   CO2 28  19 - 32 mEq/L   Glucose, Bld 96  70 - 99 mg/dL   BUN 8  6 - 23 mg/dL   Creatinine, Ser 1.61  0.50 - 1.10 mg/dL   Calcium 9.5  8.4 - 09.6 mg/dL   GFR calc non Af Amer 89 (*) >90 mL/min   GFR calc Af Amer >90  >90 mL/min   Comment:            The eGFR has been calculated     using the CKD EPI equation.     This calculation has not been     validated in all clinical     situations.     eGFR's persistently     <90  mL/min signify     possible Chronic Kidney Disease.  TROPONIN I     Status: None   Collection Time    04/16/12  7:24 PM      Result Value Range   Troponin I <0.30  <0.30 ng/mL   Comment:            Due to the release kinetics of cTnI,     a negative result within the first hours     of the onset of symptoms does not rule out     myocardial infarction with certainty.     If myocardial infarction is still suspected,     repeat the test at appropriate intervals.  TROPONIN I     Status: None   Collection Time    04/16/12 11:40 PM      Result Value Range   Troponin I <0.30  <0.30 ng/mL   Comment:            Due to the release kinetics of cTnI,     a negative result within the first hours     of the onset of symptoms does not rule out     myocardial infarction with certainty.     If myocardial infarction is still suspected,     repeat the test at appropriate intervals.  GLUCOSE, CAPILLARY     Status: Abnormal   Collection Time    04/17/12 12:47 AM      Result Value Range   Glucose-Capillary 122 (*) 70 - 99 mg/dL  BASIC METABOLIC PANEL     Status: Abnormal   Collection Time    04/17/12  4:23 AM      Result Value Range   Sodium 137  135 - 145 mEq/L   Potassium 3.9  3.5 - 5.1 mEq/L   Chloride 102  96 - 112 mEq/L   CO2 27  19 - 32 mEq/L   Glucose, Bld 142 (*) 70 - 99 mg/dL   BUN 6  6 - 23 mg/dL   Creatinine, Ser 0.45  0.50 - 1.10 mg/dL   Calcium 9.1  8.4 - 40.9 mg/dL   GFR calc non Af Amer >90  >90 mL/min   GFR calc Af Amer >90  >90 mL/min   Comment:  The eGFR has been calculated     using the CKD EPI equation.     This calculation has not been     validated in all clinical     situations.     eGFR's persistently     <90 mL/min signify     possible Chronic Kidney Disease.  CBC     Status: Abnormal   Collection Time    04/17/12  4:23 AM      Result Value Range   WBC 7.4  4.0 - 10.5 K/uL   RBC 4.80  3.87 - 5.11 MIL/uL   Hemoglobin 13.7  12.0 - 15.0 g/dL    HCT 16.1  09.6 - 04.5 %   MCV 88.3  78.0 - 100.0 fL   MCH 28.5  26.0 - 34.0 pg   MCHC 32.3  30.0 - 36.0 g/dL   RDW 40.9  81.1 - 91.4 %   Platelets 419 (*) 150 - 400 K/uL  GLUCOSE, CAPILLARY     Status: Abnormal   Collection Time    04/17/12  7:52 AM      Result Value Range   Glucose-Capillary 136 (*) 70 - 99 mg/dL    Dg Chest 2 View  07/19/2954  *RADIOLOGY REPORT*  Clinical Data: Chest pain.  Neck pain.  Tobacco use.  CHEST - 2 VIEW  Comparison: None.  Findings: Scattered pulmonary nodules and pulmonary masses are observed bilaterally.  An index mass in the right to middle lobe measures 5.4 x 3.2 by 5.2 cm.  Mass-like scarring noted along the left lung apex.  No cardiomegaly noted, but the aortic arch seems to be right-sided.  Unusual lucency at the T12-L1 level on the frontal projection may to be due to bowel gas although a vertebral anomaly cannot be totally excluded.  No pleural effusion is identified.  IMPRESSION:  1.  Scattered nodules and masses throughout both lungs.  Appearance favors metastatic malignancy to both lungs.  Fungal pneumonia and rheumatoid nodules are less likely differential diagnostic considerations.  CT chest with contrast is recommended for further workup, and considering the bilaterality of disease, imaging of the abdomen and pelvis may be prudent as well. 2.  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 3.  Lucency at the thoracolumbar junction on the frontal projection is somewhat unusual but probably simply due to bowel gas.  This could be further characterized on the CT exam to ensure that does not represent a vertebral anomaly.   Original Report Authenticated By: Gaylyn Rong, M.D.    Ct Head W Wo Contrast  04/16/2012  *RADIOLOGY REPORT*  Clinical Data: Forgetful; known lung mass.  Assess for brain metastases.  CT HEAD WITHOUT AND WITH CONTRAST  Technique:  Contiguous axial images were obtained from the base of the skull through the vertex without and  with intravenous contrast.  Contrast: OMNIPAQUE IOHEXOL 300 MG/ML  SOLN  Comparison: None.  Findings: Evaluation is suboptimal due to motion artifact. However, multiple prominent areas of decreased attenuation are seen throughout the cerebral hemispheres and at the left side of the cerebellum, compatible with multiple metastases.  Identifiable masses measure perhaps 2.0 cm at the high left frontal lobe, 2.5 cm at the expected location of the frontal horn of the left lateral ventricle, 1.5 cm at the left occipital lobe, 1.5 cm at the left cerebellar hemisphere and 1.7 cm at the posterior right parietal lobe.  There is perhaps 7 mm of rightward midline shift, though this is difficult to fully characterize.  There is no evidence of acute infarction, or intra- or extra-axial hemorrhage on CT.  There is no evidence of fracture; visualized osseous structures are unremarkable in appearance.  The visualized portions of the orbits are within normal limits.  The paranasal sinuses and mastoid air cells are well-aerated.  No significant soft tissue abnormalities are seen.  IMPRESSION:  1.  Multiple cerebral and cerebellar metastases noted, with extensive areas of decreased attenuation, effacement of the frontal horn of the left lateral ventricle and perhaps 7 mm of rightward midline shift. 2.  No definite evidence of intracranial hemorrhage.  These results were called by telephone on 04/16/2012 at 09:21 p.m. to Earley Favor PA, who verbally acknowledged these results.   Original Report Authenticated By: Tonia Ghent, M.D.    Ct Chest W Contrast  04/16/2012  *RADIOLOGY REPORT*  Clinical Data:  Assess for chest and abdominal metastatic disease; known lung masses.  CT CHEST, ABDOMEN AND PELVIS WITH CONTRAST  Technique:  Multidetector CT imaging of the chest, abdomen and pelvis was performed following the standard protocol during bolus administration of intravenous contrast.  Contrast: OMNIPAQUE IOHEXOL 300 MG/ML   SOLN  Comparison:   None.  CT CHEST  Findings:  There are two dominant masses within the lungs, with a third somewhat smaller mass also seen.  The largest mass is noted at the left upper lobe, with significant architectural distortion; it measures approximately 8.6 x 4.4 x 6.5 cm, with partial occlusion of the bronchioles to the left upper lobe, and marked mass effect on the left main pulmonary artery.  A large 6.2 x 3.7 x 3.9 cm mass is noted within the right middle lobe.  There is also a spiculated 3.1 x 3.1 x 2.4 cm mass at the right upper lobe, with significant architectural distortion.  Innumerable smaller metastases are noted throughout both lungs.  No pleural effusion is identified.  There is mild underlying emphysema, noted particularly at the right upper lobe.  No pneumothorax is seen.  Scattered coronary artery calcifications are noted.  The patient's left upper lobe mass extends into the mediastinum, with irregular lobulations.  It remains a few millimeters from both the trachea and the descending thoracic aorta, though slightly closer to the course of the left subclavian artery.  Scattered metastases are noted within the mediastinum, demonstrating centrally decreased attenuation.  These are noted at the right paratracheal and periaortic regions, measuring up to 1.3 cm in short axis.  No pericardial effusion is identified. Scattered calcific atherosclerotic disease is noted along the aortic arch.  The great vessels are grossly unremarkable in appearance.  Minimal hypodensity at the left thyroid lobe is nonspecific in appearance, and likely benign given its size.  An enlarged 1.4 cm left supraclavicular node is seen.  Scattered axillary nodes remain normal in size.  No acute osseous abnormalities are seen.  IMPRESSION:  1.  Two dominant masses within the lungs, at the left upper lobe and right middle lobe.  Smaller spiculated mass at the right upper lobe.  Significant associated architectural distortion;  the left upper lobe mass extends into the mediastinum.  Partial occlusion of the bronchus to the left upper lobe, and significant mass effect on the left main pulmonary artery. 2.  Innumerable small metastases noted throughout both lungs. 3.  Mild underlying emphysema, particularly at the right upper lobe. 4.  Scattered coronary artery calcifications seen. 5.  Scattered metastases within the mediastinum, at the right paratracheal and periaortic regions, measuring up to 1.3 cm in  short axis. 6.  1.4 cm centrally hypodense left supraclavicular node, concerning for metastatic disease. 7.  Minimal hypodensity at the left thyroid lobe is nonspecific, and likely benign given its size.  CT ABDOMEN AND PELVIS  Findings:  A few hypodensities within the liver are nonspecific but likely reflect small cysts.  The liver is otherwise unremarkable in appearance.  Two vague hypodense lesions are noted within the spleen, measuring 2.0 and 1.5 cm, concerning for metastases.  There is a large complex heterogeneous left adrenal mass, measuring 4.6 cm in size, concerning for metastatic disease.  The right adrenal gland is unremarkable in appearance.  A large stone is noted within the gallbladder; the gallbladder is otherwise grossly unremarkable.  The pancreas is grossly unremarkable.  A nonobstructing 7 mm stone is noted at the interpole region of the left kidney.  Scattered bilateral renal cysts are seen, measuring up to 1.7 cm in size. There is no evidence of hydronephrosis.  No obstructing ureteral stones are seen.  No perinephric stranding is appreciated.  No free fluid is identified.  The small bowel is unremarkable in appearance.  The stomach is within normal limits.  No acute vascular abnormalities are seen.  Scattered calcification is noted along the abdominal aorta and its branches.  The appendix is normal in caliber, without evidence for appendicitis.  The colon is unremarkable in appearance.  The bladder is mildly distended  and grossly unremarkable in appearance.  The patient is status post hysterectomy.  No suspicious adnexal masses are seen; the left ovary is unremarkable in appearance.  No inguinal lymphadenopathy is seen.  No acute osseous abnormalities are identified.  There is nonspecific sclerosis at the pubic symphysis, and vacuum phenomenon and mild sclerotic change are noted at the sacroiliac joints bilaterally.  Degenerative change is noted along the lower lumbar spine.  IMPRESSION:  1.  Large complex left adrenal mass, measuring 4.6 cm, concerning for metastatic disease. 2.  Two vague hypodensities within the spleen, concerning for metastatic disease. 3.  Small hepatic and renal hypodensities likely reflect cysts. 4.  Cholelithiasis; gallbladder otherwise unremarkable in appearance. 5.  Nonobstructing 7 mm stone at the interpole region of the left kidney. 6.  Scattered calcification along the abdominal aorta and its branches. 7.  No definite evidence for osseous metastatic disease; nonspecific sclerosis at the pubic symphysis, and mild degenerative change along the lower lumbar spine and at the sacroiliac joints.   Original Report Authenticated By: Tonia Ghent, M.D.    Ct Abdomen Pelvis W Contrast  04/16/2012  *RADIOLOGY REPORT*  Clinical Data:  Assess for chest and abdominal metastatic disease; known lung masses.  CT CHEST, ABDOMEN AND PELVIS WITH CONTRAST  Technique:  Multidetector CT imaging of the chest, abdomen and pelvis was performed following the standard protocol during bolus administration of intravenous contrast.  Contrast: OMNIPAQUE IOHEXOL 300 MG/ML  SOLN  Comparison:   None.  CT CHEST  Findings:  There are two dominant masses within the lungs, with a third somewhat smaller mass also seen.  The largest mass is noted at the left upper lobe, with significant architectural distortion; it measures approximately 8.6 x 4.4 x 6.5 cm, with partial occlusion of the bronchioles to the left upper lobe, and  marked mass effect on the left main pulmonary artery.  A large 6.2 x 3.7 x 3.9 cm mass is noted within the right middle lobe.  There is also a spiculated 3.1 x 3.1 x 2.4 cm mass at the right upper lobe,  with significant architectural distortion.  Innumerable smaller metastases are noted throughout both lungs.  No pleural effusion is identified.  There is mild underlying emphysema, noted particularly at the right upper lobe.  No pneumothorax is seen.  Scattered coronary artery calcifications are noted.  The patient's left upper lobe mass extends into the mediastinum, with irregular lobulations.  It remains a few millimeters from both the trachea and the descending thoracic aorta, though slightly closer to the course of the left subclavian artery.  Scattered metastases are noted within the mediastinum, demonstrating centrally decreased attenuation.  These are noted at the right paratracheal and periaortic regions, measuring up to 1.3 cm in short axis.  No pericardial effusion is identified. Scattered calcific atherosclerotic disease is noted along the aortic arch.  The great vessels are grossly unremarkable in appearance.  Minimal hypodensity at the left thyroid lobe is nonspecific in appearance, and likely benign given its size.  An enlarged 1.4 cm left supraclavicular node is seen.  Scattered axillary nodes remain normal in size.  No acute osseous abnormalities are seen.  IMPRESSION:  1.  Two dominant masses within the lungs, at the left upper lobe and right middle lobe.  Smaller spiculated mass at the right upper lobe.  Significant associated architectural distortion; the left upper lobe mass extends into the mediastinum.  Partial occlusion of the bronchus to the left upper lobe, and significant mass effect on the left main pulmonary artery. 2.  Innumerable small metastases noted throughout both lungs. 3.  Mild underlying emphysema, particularly at the right upper lobe. 4.  Scattered coronary artery calcifications  seen. 5.  Scattered metastases within the mediastinum, at the right paratracheal and periaortic regions, measuring up to 1.3 cm in short axis. 6.  1.4 cm centrally hypodense left supraclavicular node, concerning for metastatic disease. 7.  Minimal hypodensity at the left thyroid lobe is nonspecific, and likely benign given its size.  CT ABDOMEN AND PELVIS  Findings:  A few hypodensities within the liver are nonspecific but likely reflect small cysts.  The liver is otherwise unremarkable in appearance.  Two vague hypodense lesions are noted within the spleen, measuring 2.0 and 1.5 cm, concerning for metastases.  There is a large complex heterogeneous left adrenal mass, measuring 4.6 cm in size, concerning for metastatic disease.  The right adrenal gland is unremarkable in appearance.  A large stone is noted within the gallbladder; the gallbladder is otherwise grossly unremarkable.  The pancreas is grossly unremarkable.  A nonobstructing 7 mm stone is noted at the interpole region of the left kidney.  Scattered bilateral renal cysts are seen, measuring up to 1.7 cm in size. There is no evidence of hydronephrosis.  No obstructing ureteral stones are seen.  No perinephric stranding is appreciated.  No free fluid is identified.  The small bowel is unremarkable in appearance.  The stomach is within normal limits.  No acute vascular abnormalities are seen.  Scattered calcification is noted along the abdominal aorta and its branches.  The appendix is normal in caliber, without evidence for appendicitis.  The colon is unremarkable in appearance.  The bladder is mildly distended and grossly unremarkable in appearance.  The patient is status post hysterectomy.  No suspicious adnexal masses are seen; the left ovary is unremarkable in appearance.  No inguinal lymphadenopathy is seen.  No acute osseous abnormalities are identified.  There is nonspecific sclerosis at the pubic symphysis, and vacuum phenomenon and mild sclerotic  change are noted at the sacroiliac joints bilaterally.  Degenerative change is noted along the lower lumbar spine.  IMPRESSION:  1.  Large complex left adrenal mass, measuring 4.6 cm, concerning for metastatic disease. 2.  Two vague hypodensities within the spleen, concerning for metastatic disease. 3.  Small hepatic and renal hypodensities likely reflect cysts. 4.  Cholelithiasis; gallbladder otherwise unremarkable in appearance. 5.  Nonobstructing 7 mm stone at the interpole region of the left kidney. 6.  Scattered calcification along the abdominal aorta and its branches. 7.  No definite evidence for osseous metastatic disease; nonspecific sclerosis at the pubic symphysis, and mild degenerative change along the lower lumbar spine and at the sacroiliac joints.   Original Report Authenticated By: Tonia Ghent, M.D.     Review of Systems  Constitutional: Positive for weight loss (she doesn't know how much, but not eating at home.) and malaise/fatigue. Negative for fever, chills and diaphoresis.  HENT: Negative.   Eyes: Negative.   Respiratory: Positive for cough, sputum production, shortness of breath (No DOE) and wheezing. Negative for hemoptysis.        STILL SMOKING 1-2 PPD.  Cardiovascular: Positive for chest pain (Chest has hurt for the last 3 weeks, and she takea an advil at night.). Negative for palpitations, orthopnea, claudication, leg swelling and PND.  Gastrointestinal: Positive for heartburn (OCCASIONAL) and diarrhea (COUPLE EPISODES LAST WEEK). Negative for nausea, vomiting, abdominal pain, constipation, blood in stool and melena.  Genitourinary: Positive for frequency. Negative for dysuria, urgency, hematuria and flank pain.       THINKS she's voiding allot since admission.  Musculoskeletal: Negative.   Skin: Negative for itching and rash.       Say the place in her neck came up about 3 weeks ago.  Neurological: Negative for dizziness, tingling, tremors, sensory change, speech change,  focal weakness, seizures, loss of consciousness and weakness.       She notes some trouble navigating to work over the last couple weeks. Some days she would be fine others she had trouble remembering how to get to work.  Forgetting stuff at work.  Endo/Heme/Allergies: Negative.   Psychiatric/Behavioral: Positive for memory loss (she cant remember any medical history right now.  No primary care MD). Negative for depression, suicidal ideas, hallucinations and substance abuse. The patient is not nervous/anxious and does not have insomnia.    Blood pressure 155/74, pulse 60, temperature 98.3 F (36.8 C), temperature source Oral, resp. rate 16, height 5' 5.5" (1.664 m), weight 131 lb 6.3 oz (59.6 kg), SpO2 97.00%. Physical Exam  Constitutional:  Think cachectic woman, with slowed mentation.  No distress.  HENT:  Head: Normocephalic and atraumatic.  Nose: Nose normal.  Mouth/Throat: Oropharynx is clear and moist.  Eyes: Conjunctivae and EOM are normal. Pupils are equal, round, and reactive to light. Right eye exhibits no discharge. Left eye exhibits no discharge. No scleral icterus.  Neck: Normal range of motion. Neck supple. No JVD present. No tracheal deviation present. No thyromegaly present.  Cardiovascular: Normal rate, regular rhythm, normal heart sounds and intact distal pulses.  Exam reveals no gallop.   No murmur heard. Respiratory: Breath sounds normal. No stridor. No respiratory distress. She has no wheezes. She has no rales. She exhibits tenderness (she points to left and mid chest.  She is tender to palpation about the 4th-5th ICS mid anterior chest.).  GI: Soft. Bowel sounds are normal. She exhibits no distension and no mass. There is no tenderness. There is no rebound and no guarding.  Musculoskeletal: Normal range of motion.  She exhibits no edema and no tenderness.  Lymphadenopathy:    She has cervical adenopathy (On the left she has a hard dense palpable lymphnode (feels like 2  togethter) 2 x 1 cm).  Neurological: She is alert. No cranial nerve deficit.  Ambulates slowly but without difficulty  Skin: Skin is warm and dry. No rash noted. No erythema.  Psychiatric: She has a normal mood and affect. Her behavior is normal.  Slow mentation with obvious memory loss. Her daughter is with her and helping with decisions.    Assessment/Plan: 1. Altered mental status, and chest discomfort. 2. Bilateral pulmonary lung masses left upper lobe and right middle lobe. Multiple small metastatic disease noted in both lungs, mediastinal metastasis, partial occlusion of the  bronchial stem the left upper lobe. Mass effect to the left main pulmonary artery. 3. Enlarged left supraclavicular node. 4. Multiple cerebellar metastasis effacement of the form of the left lateral ventricle and possible rightward midline shift. 5. Emphysema/ongoing tobacco use 6. Large complex left adrenal mass, concerning for metastasis, possible spleen metastatic disease. 7. Nonobstructing 7 mm stone at the region of the left kidney.  Plan: We are asked to see for a lymph node biopsy of the left supraclavicular lymph node. I will discuss with Dr. Abbey Chatters and plan to do her tomorrow.  [facility] physician assistant for Dr. Avel Peace.  Abdulkadir Emmanuel 04/17/2012, 10:34 AM

## 2012-04-17 NOTE — Progress Notes (Signed)
   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  NA - LOS <3 / Initial given by admissions (If response is "NO", the following Medicare IM given date fields will be blank) Per UR Regulation:  Reviewed for med. necessity/level of care/duration of stay Comments:  04/17/2012- B Truitt Cruey RN,BSN,MHA

## 2012-04-17 NOTE — Consult Note (Signed)
Patient seen and examined. Chart reviewed.  Plan left supraclavicular lymph node biopsy tomorrow.  Procedure and risks (including but not limited to bleeding, infection, wound healing problems, injury to surrounding structures) were explained to her.

## 2012-04-18 ENCOUNTER — Inpatient Hospital Stay (HOSPITAL_COMMUNITY): Payer: Commercial Managed Care - PPO | Admitting: Anesthesiology

## 2012-04-18 ENCOUNTER — Encounter (HOSPITAL_COMMUNITY): Payer: Self-pay | Admitting: *Deleted

## 2012-04-18 ENCOUNTER — Encounter (HOSPITAL_COMMUNITY): Payer: Self-pay | Admitting: Anesthesiology

## 2012-04-18 ENCOUNTER — Ambulatory Visit
Admit: 2012-04-18 | Discharge: 2012-04-18 | Disposition: A | Discharge status: Home / Self Care | Payer: Commercial Managed Care - PPO | Attending: Radiation Oncology | Admitting: Radiation Oncology

## 2012-04-18 ENCOUNTER — Encounter (HOSPITAL_COMMUNITY)
Admission: EM | Disposition: A | Discharge status: Home / Self Care | Payer: Self-pay | Source: Home / Self Care | Attending: Internal Medicine

## 2012-04-18 DIAGNOSIS — M25519 Pain in unspecified shoulder: Secondary | ICD-10-CM | POA: Insufficient documentation

## 2012-04-18 DIAGNOSIS — Z510 Encounter for antineoplastic radiation therapy: Secondary | ICD-10-CM | POA: Insufficient documentation

## 2012-04-18 DIAGNOSIS — H538 Other visual disturbances: Secondary | ICD-10-CM | POA: Insufficient documentation

## 2012-04-18 DIAGNOSIS — R279 Unspecified lack of coordination: Secondary | ICD-10-CM | POA: Insufficient documentation

## 2012-04-18 DIAGNOSIS — C3490 Malignant neoplasm of unspecified part of unspecified bronchus or lung: Secondary | ICD-10-CM | POA: Insufficient documentation

## 2012-04-18 DIAGNOSIS — R110 Nausea: Secondary | ICD-10-CM | POA: Insufficient documentation

## 2012-04-18 DIAGNOSIS — C7931 Secondary malignant neoplasm of brain: Secondary | ICD-10-CM | POA: Insufficient documentation

## 2012-04-18 DIAGNOSIS — R51 Headache: Secondary | ICD-10-CM | POA: Insufficient documentation

## 2012-04-18 DIAGNOSIS — C7949 Secondary malignant neoplasm of other parts of nervous system: Secondary | ICD-10-CM

## 2012-04-18 HISTORY — PX: AXILLARY LYMPH NODE BIOPSY: SHX5737

## 2012-04-18 LAB — SURGICAL PCR SCREEN: MRSA, PCR: NEGATIVE

## 2012-04-18 LAB — GLUCOSE, CAPILLARY: Glucose-Capillary: 119 mg/dL — ABNORMAL HIGH (ref 70–99)

## 2012-04-18 SURGERY — AXILLARY LYMPH NODE BIOPSY
Anesthesia: Monitor Anesthesia Care | Laterality: Left | Wound class: Clean

## 2012-04-18 MED ORDER — LACTATED RINGERS IV SOLN: INTRAVENOUS | Status: DC

## 2012-04-18 MED ORDER — MIDAZOLAM HCL 5 MG/5ML IJ SOLN
INTRAMUSCULAR | Status: DC | PRN
  Administered 2012-04-18: 0.5 mg via INTRAVENOUS
  Administered 2012-04-18: 1 mg via INTRAVENOUS
  Administered 2012-04-18: 0.5 mg via INTRAVENOUS

## 2012-04-18 MED ORDER — CEFAZOLIN SODIUM-DEXTROSE 2-3 GM-% IV SOLR
INTRAVENOUS | Status: DC | PRN
  Administered 2012-04-18: 2 g via INTRAVENOUS

## 2012-04-18 MED ORDER — PROPOFOL INFUSION 10 MG/ML OPTIME
INTRAVENOUS | Status: DC | PRN
  Administered 2012-04-18: 120 ug/kg/min via INTRAVENOUS

## 2012-04-18 MED ORDER — HYDROCODONE-ACETAMINOPHEN 5-325 MG PO TABS
1.0000 | ORAL_TABLET | ORAL | Status: DC | PRN
  Administered 2012-04-19 (×2): 2 via ORAL
  Filled 2012-04-18: qty 2

## 2012-04-18 MED ORDER — KETOROLAC TROMETHAMINE 30 MG/ML IJ SOLN: 15.0000 mg | Freq: Once | INTRAMUSCULAR | Status: DC | PRN

## 2012-04-18 MED ORDER — FENTANYL CITRATE 0.05 MG/ML IJ SOLN: 25.0000 ug | INTRAMUSCULAR | Status: DC | PRN

## 2012-04-18 MED ORDER — PROMETHAZINE HCL 25 MG/ML IJ SOLN: 6.2500 mg | INTRAMUSCULAR | Status: DC | PRN

## 2012-04-18 MED ORDER — LACTATED RINGERS IV SOLN
INTRAVENOUS | Status: DC | PRN
  Administered 2012-04-18: 07:00:00 via INTRAVENOUS

## 2012-04-18 MED ORDER — CEFAZOLIN SODIUM-DEXTROSE 2-3 GM-% IV SOLR: 2.0000 g | Freq: Once | INTRAVENOUS | Status: DC

## 2012-04-18 MED ORDER — 0.9 % SODIUM CHLORIDE (POUR BTL) OPTIME
TOPICAL | Status: DC | PRN
  Administered 2012-04-18: 1000 mL

## 2012-04-18 MED ORDER — LIDOCAINE-EPINEPHRINE 1 %-1:100000 IJ SOLN
INTRAMUSCULAR | Status: DC | PRN
  Administered 2012-04-18: 5 mL

## 2012-04-18 SURGICAL SUPPLY — 20 items
BENZOIN TINCTURE PRP APPL 2/3 (GAUZE/BANDAGES/DRESSINGS) ×2 IMPLANT
CLOTH BEACON ORANGE TIMEOUT ST (SAFETY) ×2 IMPLANT
DRAPE UTILITY XL STRL (DRAPES) ×2 IMPLANT
DRSG TEGADERM 4X4.75 (GAUZE/BANDAGES/DRESSINGS) ×2 IMPLANT
GLOVE BIOGEL M 6.5 STRL (GLOVE) ×4 IMPLANT
GLOVE BIOGEL PI IND STRL 7.0 (GLOVE) ×1 IMPLANT
GLOVE BIOGEL PI INDICATOR 7.0 (GLOVE) ×1
GLOVE ECLIPSE 8.0 STRL XLNG CF (GLOVE) ×2 IMPLANT
GLOVE INDICATOR 8.0 STRL GRN (GLOVE) ×4 IMPLANT
GOWN STRL NON-REIN LRG LVL3 (GOWN DISPOSABLE) ×6 IMPLANT
GOWN STRL REIN XL XLG (GOWN DISPOSABLE) ×4 IMPLANT
NEEDLE HYPO 22GX1.5 SAFETY (NEEDLE) ×2 IMPLANT
NEEDLE HYPO 25X1 1.5 SAFETY (NEEDLE) ×2 IMPLANT
PACK GENERAL/GYN (CUSTOM PROCEDURE TRAY) ×2 IMPLANT
SPONGE GAUZE 4X4 12PLY (GAUZE/BANDAGES/DRESSINGS) ×2 IMPLANT
STRIP CLOSURE SKIN 1/2X4 (GAUZE/BANDAGES/DRESSINGS) ×2 IMPLANT
SUT VIC AB 3-0 54XBRD REEL (SUTURE) ×1 IMPLANT
SUT VIC AB 3-0 BRD 54 (SUTURE) ×1
SYR CONTROL 10ML LL (SYRINGE) ×2 IMPLANT
TOWEL OR 17X26 10 PK STRL BLUE (TOWEL DISPOSABLE) ×2 IMPLANT

## 2012-04-18 NOTE — Progress Notes (Signed)
  Radiation Oncology         [phone]) [phone] ________________________________  Name: Isabella Carpenter MRN: [id number]  Date: 04/16/2012  DOB: 01/13/1945  SIMULATION AND TREATMENT PLANNING NOTE  Inpatient  DIAGNOSIS:  Putative Brain Metastases  NARRATIVE:  The patient was brought to the CT Simulation planning suite.  Identity was confirmed.  All relevant records and images related to the planned course of therapy were reviewed.  The patient freely provided informed written consent to proceed with treatment after reviewing the details related to the planned course of therapy. The consent form was witnessed and verified by the simulation staff.    Then, the patient was set-up in a stable reproducible  supine position for radiation therapy - head in Aquaplast Mask.  CT images were obtained.  Surface markings were placed.  The CT images were loaded into the planning software.    TREATMENT PLANNING NOTE: Treatment planning then occurred.  The radiation prescription was entered and confirmed.    A total of 3 medically necessary complex treatment devices were fabricated and supervised by me - 2 fields with MLCs for custom blocks, and a aquaplast mask. I have requested : Pathology to expedite reading of her biopsy from this AM.  I have spoke with the patient's daughter, and obtained consent from her as well.  The patient will receive 30 Gy in 10 fractions with 2 opposed fields to the whole brain.   -----------------------------------  Lonie Peak, MD

## 2012-04-18 NOTE — Progress Notes (Signed)
[facility] PROGRESS NOTE  Mikenna Bunkley ZOX:[id number] DOB: 1945/03/31 DOA: 04/16/2012 PCP: No PCP Per Patient  Assessment/Plan: Brief HPI:  Isabella Carpenter is a 67 y.o. Caucasian female with no significant past medical history and has not seen a physician for a long time who was brought in by family for the above reasons. Over the last week family has noted that she has had intermittent episodes of confusion. Per daughter, on 04/14/2012, patient appeared confused on the telephone and had only short one-word yes or no responses. On arrival to ED, she had imaging of her brain showed metastatic disease in her brain, chest, and abdomen. Hospitalist service was consulted for further care and management. Surgery consulted for supraclavicular node biopsy.     Altered mental status : appears to have resolved. Probably from brain metastases. 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 Discussed with Dr. Jordan Likes, neurosurgery, given patient's extensive metastatic disease did not think he could offer the patient anything from a neurosurgical standpoint as she does not have any deficits at this time. Dr. Jordan Likes, recommended dexamethasone 10 mg IV every 6 hours for 4 doses then dexamethasone 4 mg by mouth every 6 hours and recommended whole brain radiation.radiation oncology recommendations given.  Oncology consult from Dr Gwenyth Bouillon requested.   Chest pain  Initial troponin negative. EKG not concerning for any ischemic changes. Continue to monitor on telemetry, cycle cardiac enzymes negative. Suspect patient's chest pain is likely related to metastatic disease in her lungs as imaging showed 2 dominant mass is in the left upper and right middle lobe. On the left there is a partial occlusion of the bronchus to the left upper lobe with significant mass effect on the left main pulmonary artery.    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 Patient has palpable left supraclavicular lymph nodes which may be easily biopsied. General surgery consultation requested for biopsy, she underwent supraclvicular LN biopsy on 04/18/12.   Elevated blood pressure  Likely related to pain and anxiety. Continue to monitor. When necessary hydralazine for systolic blood pressure greater than 160.  Tobacco abuse  Counseled the patient on cessation.  Prophylaxis  Lovenox.     Code Status: DNR Family Communication: DAUGHTER at bedside Disposition Plan: pending.    Consultants:   general surgery  Oncology  Radiation oncology.  HPI/Subjective: Comfortable, no new complaints.   Objective: Filed Vitals:   04/18/12 0830 04/18/12 0845 04/18/12 0900 04/18/12 0932  BP: 161/77 158/66 156/97 156/70  Pulse: 57 56 54 54  Temp:   97.8 F (36.6 C) 97.7 F (36.5 C)  TempSrc:      Resp: 16 16 20 16   Height:      Weight:      SpO2: 100% 94% 93% 94%    Intake/Output Summary (Last 24 hours) at 04/18/12 1226 Last data filed at 04/18/12 0900  Gross per 24 hour  Intake   2170 ml  Output   1420 ml  Net    750 ml   Filed Weights   04/17/12 0038 04/18/12 0614  Weight: 59.6 kg (131 lb 6.3 oz) 60.2 kg (132 lb 11.5 oz)    Exam: Alert afebrile comfortable, oriented to place person and time.  CV: S1 and S2  Lungs: Clear to ascultation bilaterally  Abdomen: Soft, Nontender, Nondistended, +bowel sounds.  Ext: Good pulses. Trace edema. No clubbing or cyanosis noted.  Neuro: Cranial Nerves II-XII grossly intact. Has 5/5 motor strength in upper and lower extremities.  Chest: No reproducible pain with palpation.   Data Reviewed: Basic Metabolic Panel:  Recent Labs Lab 04/16/12 1924 04/17/12 0423  NA 138 137  K 3.5 3.9  CL 102 102  CO2 28 27  GLUCOSE 96 142*  BUN 8 6  CREATININE 0.71 0.62   CALCIUM 9.5 9.1   Liver Function Tests: No results found for this basename: AST, ALT, ALKPHOS, BILITOT, PROT, ALBUMIN,  in the last 168 hours No results found for this basename: LIPASE, AMYLASE,  in the last 168 hours No results found for this basename: AMMONIA,  in the last 168 hours CBC:  Recent Labs Lab 04/16/12 1924 04/17/12 0423  WBC 8.6 7.4  HGB 14.6 13.7  HCT 41.7 42.4  MCV 86.2 88.3  PLT 380 419*   Cardiac Enzymes:  Recent Labs Lab 04/16/12 1924 04/16/12 2340  TROPONINI <0.30 <0.30   BNP (last 3 results) No results found for this basename: PROBNP,  in the last 8760 hours CBG:  Recent Labs Lab 04/17/12 1141 04/17/12 1648 04/17/12 2220 04/18/12 0848 04/18/12 1157  GLUCAP 131* 183* 112* 119* 135*    Recent Results (from the past 240 hour(s))  SURGICAL PCR SCREEN     Status: None   Collection Time    04/18/12  4:26 AM      Result Value Range Status   MRSA, PCR NEGATIVE  NEGATIVE Final   Staphylococcus aureus NEGATIVE  NEGATIVE Final   Comment:            The Xpert SA Assay (FDA     approved for NASAL specimens     in patients over 4 years of age),     is one component of     a comprehensive surveillance     program.  Test performance has     been validated by [employer] for patients greater     than or equal to 24 year old.     It is not intended     to diagnose infection nor to     guide or monitor treatment.     Studies: Dg Chest 2 View  04/16/2012  *RADIOLOGY REPORT*  Clinical Data: Chest pain.  Neck pain.  Tobacco use.  CHEST - 2 VIEW  Comparison: None.  Findings: Scattered pulmonary nodules and pulmonary masses are observed bilaterally.  An index mass in the right to middle lobe measures 5.4 x 3.2 by 5.2 cm.  Mass-like scarring noted along the left lung apex.  No cardiomegaly noted, but the aortic arch seems to be right-sided.  Unusual lucency at the T12-L1 level on the frontal projection may to be due to bowel gas although a vertebral  anomaly cannot be totally excluded.  No pleural effusion is identified.  IMPRESSION:  1.  Scattered nodules and masses throughout both lungs.  Appearance favors metastatic malignancy to both lungs.  Fungal pneumonia and rheumatoid nodules are less likely differential diagnostic considerations.  CT chest with contrast is recommended for further workup, and considering the bilaterality of disease, imaging of the abdomen and pelvis may be prudent as well. 2.  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 3.  Lucency at the thoracolumbar junction on the frontal projection is somewhat unusual but probably simply due to bowel gas.  This could be further characterized on the CT exam  to ensure that does not represent a vertebral anomaly.   Original Report Authenticated By: Gaylyn Rong, M.D.    Ct Head W Wo Contrast  04/16/2012  *RADIOLOGY REPORT*  Clinical Data: Forgetful; known lung mass.  Assess for brain metastases.  CT HEAD WITHOUT AND WITH CONTRAST  Technique:  Contiguous axial images were obtained from the base of the skull through the vertex without and with intravenous contrast.  Contrast: OMNIPAQUE IOHEXOL 300 MG/ML  SOLN  Comparison: None.  Findings: Evaluation is suboptimal due to motion artifact. However, multiple prominent areas of decreased attenuation are seen throughout the cerebral hemispheres and at the left side of the cerebellum, compatible with multiple metastases.  Identifiable masses measure perhaps 2.0 cm at the high left frontal lobe, 2.5 cm at the expected location of the frontal horn of the left lateral ventricle, 1.5 cm at the left occipital lobe, 1.5 cm at the left cerebellar hemisphere and 1.7 cm at the posterior right parietal lobe.  There is perhaps 7 mm of rightward midline shift, though this is difficult to fully characterize.  There is no evidence of acute infarction, or intra- or extra-axial hemorrhage on CT.  There is no evidence of fracture; visualized osseous  structures are unremarkable in appearance.  The visualized portions of the orbits are within normal limits.  The paranasal sinuses and mastoid air cells are well-aerated.  No significant soft tissue abnormalities are seen.  IMPRESSION:  1.  Multiple cerebral and cerebellar metastases noted, with extensive areas of decreased attenuation, effacement of the frontal horn of the left lateral ventricle and perhaps 7 mm of rightward midline shift. 2.  No definite evidence of intracranial hemorrhage.  These results were called by telephone on 04/16/2012 at 09:21 p.m. to Earley Favor PA, who verbally acknowledged these results.   Original Report Authenticated By: Tonia Ghent, M.D.    Ct Chest W Contrast  04/16/2012  *RADIOLOGY REPORT*  Clinical Data:  Assess for chest and abdominal metastatic disease; known lung masses.  CT CHEST, ABDOMEN AND PELVIS WITH CONTRAST  Technique:  Multidetector CT imaging of the chest, abdomen and pelvis was performed following the standard protocol during bolus administration of intravenous contrast.  Contrast: OMNIPAQUE IOHEXOL 300 MG/ML  SOLN  Comparison:   None.  CT CHEST  Findings:  There are two dominant masses within the lungs, with a third somewhat smaller mass also seen.  The largest mass is noted at the left upper lobe, with significant architectural distortion; it measures approximately 8.6 x 4.4 x 6.5 cm, with partial occlusion of the bronchioles to the left upper lobe, and marked mass effect on the left main pulmonary artery.  A large 6.2 x 3.7 x 3.9 cm mass is noted within the right middle lobe.  There is also a spiculated 3.1 x 3.1 x 2.4 cm mass at the right upper lobe, with significant architectural distortion.  Innumerable smaller metastases are noted throughout both lungs.  No pleural effusion is identified.  There is mild underlying emphysema, noted particularly at the right upper lobe.  No pneumothorax is seen.  Scattered coronary artery calcifications are noted.  The  patient's left upper lobe mass extends into the mediastinum, with irregular lobulations.  It remains a few millimeters from both the trachea and the descending thoracic aorta, though slightly closer to the course of the left subclavian artery.  Scattered metastases are noted within the mediastinum, demonstrating centrally decreased attenuation.  These are noted at the right paratracheal and periaortic regions,  measuring up to 1.3 cm in short axis.  No pericardial effusion is identified. Scattered calcific atherosclerotic disease is noted along the aortic arch.  The great vessels are grossly unremarkable in appearance.  Minimal hypodensity at the left thyroid lobe is nonspecific in appearance, and likely benign given its size.  An enlarged 1.4 cm left supraclavicular node is seen.  Scattered axillary nodes remain normal in size.  No acute osseous abnormalities are seen.  IMPRESSION:  1.  Two dominant masses within the lungs, at the left upper lobe and right middle lobe.  Smaller spiculated mass at the right upper lobe.  Significant associated architectural distortion; the left upper lobe mass extends into the mediastinum.  Partial occlusion of the bronchus to the left upper lobe, and significant mass effect on the left main pulmonary artery. 2.  Innumerable small metastases noted throughout both lungs. 3.  Mild underlying emphysema, particularly at the right upper lobe. 4.  Scattered coronary artery calcifications seen. 5.  Scattered metastases within the mediastinum, at the right paratracheal and periaortic regions, measuring up to 1.3 cm in short axis. 6.  1.4 cm centrally hypodense left supraclavicular node, concerning for metastatic disease. 7.  Minimal hypodensity at the left thyroid lobe is nonspecific, and likely benign given its size.  CT ABDOMEN AND PELVIS  Findings:  A few hypodensities within the liver are nonspecific but likely reflect small cysts.  The liver is otherwise unremarkable in appearance.  Two  vague hypodense lesions are noted within the spleen, measuring 2.0 and 1.5 cm, concerning for metastases.  There is a large complex heterogeneous left adrenal mass, measuring 4.6 cm in size, concerning for metastatic disease.  The right adrenal gland is unremarkable in appearance.  A large stone is noted within the gallbladder; the gallbladder is otherwise grossly unremarkable.  The pancreas is grossly unremarkable.  A nonobstructing 7 mm stone is noted at the interpole region of the left kidney.  Scattered bilateral renal cysts are seen, measuring up to 1.7 cm in size. There is no evidence of hydronephrosis.  No obstructing ureteral stones are seen.  No perinephric stranding is appreciated.  No free fluid is identified.  The small bowel is unremarkable in appearance.  The stomach is within normal limits.  No acute vascular abnormalities are seen.  Scattered calcification is noted along the abdominal aorta and its branches.  The appendix is normal in caliber, without evidence for appendicitis.  The colon is unremarkable in appearance.  The bladder is mildly distended and grossly unremarkable in appearance.  The patient is status post hysterectomy.  No suspicious adnexal masses are seen; the left ovary is unremarkable in appearance.  No inguinal lymphadenopathy is seen.  No acute osseous abnormalities are identified.  There is nonspecific sclerosis at the pubic symphysis, and vacuum phenomenon and mild sclerotic change are noted at the sacroiliac joints bilaterally.  Degenerative change is noted along the lower lumbar spine.  IMPRESSION:  1.  Large complex left adrenal mass, measuring 4.6 cm, concerning for metastatic disease. 2.  Two vague hypodensities within the spleen, concerning for metastatic disease. 3.  Small hepatic and renal hypodensities likely reflect cysts. 4.  Cholelithiasis; gallbladder otherwise unremarkable in appearance. 5.  Nonobstructing 7 mm stone at the interpole region of the left kidney. 6.   Scattered calcification along the abdominal aorta and its branches. 7.  No definite evidence for osseous metastatic disease; nonspecific sclerosis at the pubic symphysis, and mild degenerative change along the lower lumbar spine and at  the sacroiliac joints.   Original Report Authenticated By: Tonia Ghent, M.D.    Ct Abdomen Pelvis W Contrast  04/16/2012  *RADIOLOGY REPORT*  Clinical Data:  Assess for chest and abdominal metastatic disease; known lung masses.  CT CHEST, ABDOMEN AND PELVIS WITH CONTRAST  Technique:  Multidetector CT imaging of the chest, abdomen and pelvis was performed following the standard protocol during bolus administration of intravenous contrast.  Contrast: OMNIPAQUE IOHEXOL 300 MG/ML  SOLN  Comparison:   None.  CT CHEST  Findings:  There are two dominant masses within the lungs, with a third somewhat smaller mass also seen.  The largest mass is noted at the left upper lobe, with significant architectural distortion; it measures approximately 8.6 x 4.4 x 6.5 cm, with partial occlusion of the bronchioles to the left upper lobe, and marked mass effect on the left main pulmonary artery.  A large 6.2 x 3.7 x 3.9 cm mass is noted within the right middle lobe.  There is also a spiculated 3.1 x 3.1 x 2.4 cm mass at the right upper lobe, with significant architectural distortion.  Innumerable smaller metastases are noted throughout both lungs.  No pleural effusion is identified.  There is mild underlying emphysema, noted particularly at the right upper lobe.  No pneumothorax is seen.  Scattered coronary artery calcifications are noted.  The patient's left upper lobe mass extends into the mediastinum, with irregular lobulations.  It remains a few millimeters from both the trachea and the descending thoracic aorta, though slightly closer to the course of the left subclavian artery.  Scattered metastases are noted within the mediastinum, demonstrating centrally decreased attenuation.  These are  noted at the right paratracheal and periaortic regions, measuring up to 1.3 cm in short axis.  No pericardial effusion is identified. Scattered calcific atherosclerotic disease is noted along the aortic arch.  The great vessels are grossly unremarkable in appearance.  Minimal hypodensity at the left thyroid lobe is nonspecific in appearance, and likely benign given its size.  An enlarged 1.4 cm left supraclavicular node is seen.  Scattered axillary nodes remain normal in size.  No acute osseous abnormalities are seen.  IMPRESSION:  1.  Two dominant masses within the lungs, at the left upper lobe and right middle lobe.  Smaller spiculated mass at the right upper lobe.  Significant associated architectural distortion; the left upper lobe mass extends into the mediastinum.  Partial occlusion of the bronchus to the left upper lobe, and significant mass effect on the left main pulmonary artery. 2.  Innumerable small metastases noted throughout both lungs. 3.  Mild underlying emphysema, particularly at the right upper lobe. 4.  Scattered coronary artery calcifications seen. 5.  Scattered metastases within the mediastinum, at the right paratracheal and periaortic regions, measuring up to 1.3 cm in short axis. 6.  1.4 cm centrally hypodense left supraclavicular node, concerning for metastatic disease. 7.  Minimal hypodensity at the left thyroid lobe is nonspecific, and likely benign given its size.  CT ABDOMEN AND PELVIS  Findings:  A few hypodensities within the liver are nonspecific but likely reflect small cysts.  The liver is otherwise unremarkable in appearance.  Two vague hypodense lesions are noted within the spleen, measuring 2.0 and 1.5 cm, concerning for metastases.  There is a large complex heterogeneous left adrenal mass, measuring 4.6 cm in size, concerning for metastatic disease.  The right adrenal gland is unremarkable in appearance.  A large stone is noted within the gallbladder; the gallbladder  is otherwise  grossly unremarkable.  The pancreas is grossly unremarkable.  A nonobstructing 7 mm stone is noted at the interpole region of the left kidney.  Scattered bilateral renal cysts are seen, measuring up to 1.7 cm in size. There is no evidence of hydronephrosis.  No obstructing ureteral stones are seen.  No perinephric stranding is appreciated.  No free fluid is identified.  The small bowel is unremarkable in appearance.  The stomach is within normal limits.  No acute vascular abnormalities are seen.  Scattered calcification is noted along the abdominal aorta and its branches.  The appendix is normal in caliber, without evidence for appendicitis.  The colon is unremarkable in appearance.  The bladder is mildly distended and grossly unremarkable in appearance.  The patient is status post hysterectomy.  No suspicious adnexal masses are seen; the left ovary is unremarkable in appearance.  No inguinal lymphadenopathy is seen.  No acute osseous abnormalities are identified.  There is nonspecific sclerosis at the pubic symphysis, and vacuum phenomenon and mild sclerotic change are noted at the sacroiliac joints bilaterally.  Degenerative change is noted along the lower lumbar spine.  IMPRESSION:  1.  Large complex left adrenal mass, measuring 4.6 cm, concerning for metastatic disease. 2.  Two vague hypodensities within the spleen, concerning for metastatic disease. 3.  Small hepatic and renal hypodensities likely reflect cysts. 4.  Cholelithiasis; gallbladder otherwise unremarkable in appearance. 5.  Nonobstructing 7 mm stone at the interpole region of the left kidney. 6.  Scattered calcification along the abdominal aorta and its branches. 7.  No definite evidence for osseous metastatic disease; nonspecific sclerosis at the pubic symphysis, and mild degenerative change along the lower lumbar spine and at the sacroiliac joints.   Original Report Authenticated By: Tonia Ghent, M.D.     Scheduled Meds: . dexamethasone  4 mg  Oral Q6H  . docusate sodium  100 mg Oral BID  . feeding supplement  237 mL Oral BID BM  . multivitamin with minerals  1 tablet Oral Daily  . senna  1 tablet Oral BID   Continuous Infusions: . sodium chloride 75 mL/hr at 04/17/12 2358    Principal Problem:   Acute delirium Active Problems:   Chest pain   Tobacco abuse   Elevated blood pressure   Metastasis of unknown primary        [facility]  [facility] Pager [phone]. If 7PM-7AM, please contact night-coverage at [email or web address], password [facility] 04/18/2012, 12:26 PM  LOS: 2 days

## 2012-04-18 NOTE — Progress Notes (Signed)
Visit the result of a referral from the daytime chaplains.  Ms Isabella Carpenter did not wish to talk much. Although she said she is doing okay with her treatments and understands her current medical situation she exhibited and spoke briefly of her fears. She chose not to elaborate, say only that she is well taken care of.  Listening and prayer the greater part of her spiritual care tonight.  Page chaplain if Ms Isabella Carpenter wishes further support and care.  Isabella Carpenter. Isabella Carpenter, DMin Chaplain

## 2012-04-18 NOTE — Anesthesia Preprocedure Evaluation (Signed)
Anesthesia Evaluation  Patient identified by MRN, date of birth, ID band Patient awake    Reviewed: Allergy & Precautions, H&P , NPO status , Patient's Chart, lab work & pertinent test results  Airway Mallampati: II TM Distance: >3 FB Neck ROM: Full    Dental no notable dental hx.    Pulmonary Current Smoker,  At least 60pk yr hx breath sounds clear to auscultation  Pulmonary exam normal       Cardiovascular negative cardio ROS  Rhythm:Regular Rate:Normal     Neuro/Psych negative neurological ROS  negative psych ROS   GI/Hepatic negative GI ROS, Neg liver ROS,   Endo/Other  negative endocrine ROS  Renal/GU negative Renal ROS  negative genitourinary   Musculoskeletal negative musculoskeletal ROS (+)   Abdominal   Peds negative pediatric ROS (+)  Hematology negative hematology ROS (+)   Anesthesia Other Findings   Reproductive/Obstetrics negative OB ROS                           Anesthesia Physical Anesthesia Plan  ASA: III  Anesthesia Plan: MAC   Post-op Pain Management:    Induction: Intravenous  Airway Management Planned: Simple Face Mask  Additional Equipment:   Intra-op Plan:   Post-operative Plan:   Informed Consent: I have reviewed the patients History and Physical, chart, labs and discussed the procedure including the risks, benefits and alternatives for the proposed anesthesia with the patient or authorized representative who has indicated his/her understanding and acceptance.   Dental advisory given  Plan Discussed with: CRNA and Surgeon  Anesthesia Plan Comments:         Anesthesia Quick Evaluation

## 2012-04-18 NOTE — Transfer of Care (Signed)
Immediate Anesthesia Transfer of Care Note  Patient: Isabella Carpenter  Procedure(s) Performed: Procedure(s) with comments: AXILLARY LYMPH NODE BIOPSY (Left) - left subclavian. need one hour  Patient Location: PACU  Anesthesia Type:MAC  Level of Consciousness: awake and patient cooperative  Airway & Oxygen Therapy: Patient Spontanous Breathing and Patient connected to face mask oxygen  Post-op Assessment: Report given to PACU RN and Post -op Vital signs reviewed and stable  Post vital signs: Reviewed and stable  Complications: No apparent anesthesia complications

## 2012-04-18 NOTE — Progress Notes (Signed)
Physical Therapy Treatment Patient Details Name: Ly Wass MRN: [id number] DOB: [birth year] Today's Date: 04/18/2012 Time: 6213-0865 PT Time Calculation (min): 8 min  PT Assessment / Plan / Recommendation Comments on Treatment Session  Pt doing well with mobility. She ambulated 67' with RW with supervision today. Gait distance limited by fatigue.    Follow Up Recommendations  Home health PT;Supervision/Assistance - 24 hour     Does the patient have the potential to tolerate intense rehabilitation     Barriers to Discharge        Equipment Recommendations  Rolling walker with 5" wheels    Recommendations for Other Services    Frequency Min 3X/week   Plan Frequency remains appropriate;Discharge plan remains appropriate    Precautions / Restrictions Precautions Precautions: Fall Precaution Comments: Monitor O2 Restrictions Weight Bearing Restrictions: No   Pertinent Vitals/Pain *pt denies pain**    Mobility  Bed Mobility Bed Mobility: Not assessed Transfers Transfers: Sit to Stand;Stand to Sit Sit to Stand: With upper extremity assist;From bed;4: Min guard Stand to Sit: With upper extremity assist;With armrests;To chair/3-in-1;5: Supervision Details for Transfer Assistance: Cues for hand placement and safety.  Ambulation/Gait Ambulation/Gait Assistance: 5: Supervision Ambulation Distance (Feet): 450 Feet Assistive device: Rolling walker Ambulation/Gait Assistance Details: steady with RW, no LOB Gait Pattern: Within Functional Limits Gait velocity: WNL Stairs: No Wheelchair Mobility Wheelchair Mobility: No    Exercises     PT Diagnosis:    PT Problem List:   PT Treatment Interventions:     PT Goals Acute Rehab PT Goals PT Goal Formulation: With patient/family Time For Goal Achievement: 05/01/12 Potential to Achieve Goals: Good Pt will go Supine/Side to Sit: with modified independence Pt will go Sit to Supine/Side: with modified independence PT Goal: Sit  to Supine/Side - Progress: Progressing toward goal Pt will go Sit to Stand: with modified independence PT Goal: Sit to Stand - Progress: Progressing toward goal Pt will Ambulate: >150 feet;with modified independence;with least restrictive assistive device PT Goal: Ambulate - Progress: Progressing toward goal Pt will Perform Home Exercise Program: with supervision, verbal cues required/provided  Visit Information  Last PT Received On: 04/18/12 Assistance Needed: +1    Subjective Data  Subjective: My daughter is going to move in with me.  Patient Stated Goal: n/a   Cognition  Cognition Overall Cognitive Status: Appears within functional limits for tasks assessed/performed Arousal/Alertness: Awake/alert Orientation Level: Appears intact for tasks assessed Behavior During Session: Flat affect    Balance     End of Session PT - End of Session Equipment Utilized During Treatment: Gait belt Activity Tolerance: Patient tolerated treatment well Patient left: in chair;with call bell/phone within reach Nurse Communication: Mobility status   GP     Ralene Bathe Kistler 04/18/2012, 1:32 PM [phone]

## 2012-04-18 NOTE — Progress Notes (Signed)
Pt already transferred to surgery for lymph node biopsy this am. Will assess pt when she returns to the floor.   Arta Bruce [facility] 04/18/2012  7:27 AM

## 2012-04-18 NOTE — Preoperative (Signed)
Beta Blockers   Reason not to administer Beta Blockers:Not Applicable 

## 2012-04-18 NOTE — Op Note (Signed)
Operative Note  Isabella Carpenter female 67 y.o. 04/18/2012  PREOPERATIVE DX:  Left supraclavicular lymphadenopathy with multiple masses in the lungs and brain  POSTOPERATIVE DX:  Same  PROCEDURE:  Left super clavicular excisional lymph node biopsy         Surgeon: Adolph Pollack   Assistants: None  Anesthesia: Monitored Local Anesthesia with Sedation  Indications: This is a 67 year old female with multiple lung masses as well as brain masses. She has left supraclavicular adenopathy. She presents now for lymph node biopsy to obtain tissue for diagnosis.    Procedure Detail:  She was seen in the holding area and the left neck marked my initials. She is brought to the operating room placed supine on the operating table and given intravenous sedation. The left supraclavicular area neck were sterilely prepped and draped.  One percent Xylocaine was infiltrated directly over the palpable lymph node in the skin and subcutaneous tissues. A transverse incision was made the subcutaneous tissues to dissect it with cautery and sharp dissection. The lymph node was identified and excised using electrocautery. It was very firm and somewhat irregular. It was sent fresh to pathology.  The wound was inspected and hemostasis was adequate. The subcutaneous tissue was then approximated with running 4-0 Monocryl stitch. The skin is closed with a 4-0 Monocryl subcuticular stitch. Steri-Strips and a sterile dressing were applied.  She tolerated the procedure well without any apparent complications and was sent to recovery room in satisfactory condition.  Estimated Blood Loss:  Minimal         Drains: none  Blood Given: none          Specimens: Left subclavicular lymph node        Complications:  * No complications entered in OR log *         Disposition: PACU - hemodynamically stable.         Condition: stable

## 2012-04-18 NOTE — Anesthesia Postprocedure Evaluation (Signed)
  Anesthesia Post-op Note  Patient: Isabella Carpenter  Procedure(s) Performed: Procedure(s) (LRB): AXILLARY LYMPH NODE BIOPSY (Left)  Patient Location: PACU  Anesthesia Type: General  Level of Consciousness: awake and alert   Airway and Oxygen Therapy: Patient Spontanous Breathing  Post-op Pain: mild  Post-op Assessment: Post-op Vital signs reviewed, Patient's Cardiovascular Status Stable, Respiratory Function Stable, Patent Airway and No signs of Nausea or vomiting  Last Vitals:  Filed Vitals:   04/18/12 0830  BP: 161/77  Pulse: 57  Temp:   Resp: 16    Post-op Vital Signs: stable   Complications: No apparent anesthesia complications

## 2012-04-19 ENCOUNTER — Encounter (HOSPITAL_COMMUNITY): Payer: Self-pay | Admitting: General Surgery

## 2012-04-19 DIAGNOSIS — F17200 Nicotine dependence, unspecified, uncomplicated: Secondary | ICD-10-CM

## 2012-04-19 DIAGNOSIS — C7951 Secondary malignant neoplasm of bone: Secondary | ICD-10-CM

## 2012-04-19 DIAGNOSIS — R079 Chest pain, unspecified: Secondary | ICD-10-CM

## 2012-04-19 DIAGNOSIS — C7800 Secondary malignant neoplasm of unspecified lung: Secondary | ICD-10-CM

## 2012-04-19 DIAGNOSIS — R030 Elevated blood-pressure reading, without diagnosis of hypertension: Secondary | ICD-10-CM

## 2012-04-19 DIAGNOSIS — C7949 Secondary malignant neoplasm of other parts of nervous system: Principal | ICD-10-CM

## 2012-04-19 DIAGNOSIS — C801 Malignant (primary) neoplasm, unspecified: Secondary | ICD-10-CM

## 2012-04-19 LAB — GLUCOSE, CAPILLARY: Glucose-Capillary: 163 mg/dL — ABNORMAL HIGH (ref 70–99)

## 2012-04-19 MED ORDER — DEXAMETHASONE 4 MG PO TABS: 4.0000 mg | ORAL_TABLET | Freq: Four times a day (QID) | ORAL | Status: DC

## 2012-04-19 MED ORDER — HYDROCODONE-ACETAMINOPHEN 5-325 MG PO TABS: 1.0000 | ORAL_TABLET | ORAL | Status: DC | PRN

## 2012-04-19 NOTE — Progress Notes (Signed)
Patient seen and examined.  Agree with PA's note.  

## 2012-04-19 NOTE — Discharge Summary (Signed)
Physician Discharge Summary  Isabella Carpenter ZOX:[id number] DOB: 09-10-1945 DOA: 04/16/2012  PCP: No PCP Per Patient  Admit date: 04/16/2012 Discharge date: 04/19/2012  Time spent: 45 minutes  Recommendations for Outpatient Follow-up:  1. Please followup with radiation oncology as scheduled 2. Has followup with medical oncology after he finished her radiation treatments  Discharge Diagnoses:  Principal Problem:   Acute delirium Active Problems:   Chest pain   Tobacco abuse   Elevated blood pressure   Metastasis of unknown primary  Discharge Condition: stable  Diet recommendation: regular  Filed Weights   04/17/12 0038 04/18/12 0614 04/19/12 0745  Weight: 59.6 kg (131 lb 6.3 oz) 60.2 kg (132 lb 11.5 oz) 59.6 kg (131 lb 6.3 oz)    History of present illness:  Isabella Carpenter is a 67 y.o. Caucasian female with no significant past medical history and has not seen a physician for a long time who was brought in by family for chest pain and acute encephalopathy. Over the last week family has noted that she has had intermittent episodes of confusion. Per daughter, on 04/14/2012, patient appeared confused on the telephone and had only short one-word yes or no responses. Over the weekend family was notified by patient's coworker on facebook that the patient was confused at work over the last week. Over the last 2-3 weeks she has had intermittent episodes of left-sided chest pain. When family confronted the patient today, she was agreeable to coming to the emergency department for further evaluation. 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 Hospitalist service was consulted for further care and management. Patient has not had any recent fevers, chills, nausea, vomiting, shortness of breath, or abdominal pain. She did complain of diarrhea 2 days ago which has resolved. She does complain of left-sided headache with intermittent visual changes. Currently denies any visual  changes.  Hospital Course:  Altered mental status: resolved, this was felt to be due to brain metastases. 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 Discussed with Dr. Jordan Likes, neurosurgery, given patient's extensive metastatic disease did not think he could offer the patient anything from a neurosurgical standpoint as she does not have any deficits at this time. Dr. Jordan Likes, recommended dexamethasone 10 mg IV every 6 hours for 4 doses then dexamethasone 4 mg by mouth every 6 hours and recommended whole brain radiation. Radiation oncology has been consulted and patient will start radiation treatment on 4/10 at 1:30 pm.  Chest pain Initial troponin negative. EKG not concerning for any ischemic changes. Continue to monitor on telemetry, cycle cardiac enzymes negative. Suspect patient's chest pain is likely related to metastatic disease in her lungs as imaging showed 2 dominant mass is in the left upper and right middle lobe. On the left there is a partial occlusion of the bronchus to the left upper lobe with significant mass effect on the left main pulmonary artery.  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 Patient has palpable left supraclavicular lymph nodes which may be easily biopsied. General surgery consultation requested for biopsy, she underwent supraclvicular LN biopsy on 04/18/12. Final pathology pending.  Procedures:  Node biopsy  Consultations:  Surgery  Oncology  Radiation oncology  Discharge Exam: Filed Vitals:   04/18/12 2100 04/19/12 0745 04/19/12 1126 04/19/12 1341  BP: 154/70 145/68  144/79  Pulse:  58 54  94  Temp: 98.1 F (36.7 C) 98.2 F (36.8 C)  97.5 F (36.4 C)  TempSrc: Oral Oral  Axillary  Resp: 20 20  19   Height:      Weight:  59.6 kg (131 lb 6.3 oz)     SpO2: 94% 91% 97% 95%   General: NAD Cardiovascular: RRR without MRg Respiratory: CTA biL  Discharge Instructions   Future Appointments Provider Department Dept Phone   04/20/2012 1:30 PM Lonie Peak, MD [facility] [phone]   Joint Appt [facility] [id number] [facility] [phone]   04/21/2012 4:10 PM [facility] [id number] [facility] [phone]   04/24/2012 12:30 PM [facility] [id number] [facility] [phone]   04/25/2012 1:45 PM [facility] [id number] [facility] [phone]   04/26/2012 11:55 AM [facility] [id number] [facility] [phone]   04/27/2012 1:50 PM [facility] [id number] [facility] [phone]   04/28/2012 1:50 PM [facility] [id number] [facility] [phone]   05/01/2012 1:50 PM [facility] [id number] [facility] [phone]   05/02/2012 1:50 PM [facility] [id number]  [facility] [phone]   05/03/2012 1:50 PM [facility] [id number]  [facility] [phone]       Medication List    TAKE these medications       dexamethasone 4 MG tablet  Commonly known as:  DECADRON  Take 1 tablet (4 mg total) by mouth every 6 (six) hours.     HYDROcodone-acetaminophen 5-325 MG per tablet  Commonly known as:  NORCO/VICODIN  Take 1-2 tablets by mouth every 4 (four) hours as needed.     ibuprofen 200 MG tablet  Commonly known as:  ADVIL,MOTRIN  Take 200 mg by mouth every 6 (six) hours as needed for pain. For pain           Follow-up Information   Follow up with Primary care provider. Schedule an appointment as soon as possible for a visit in 1 week.      The results of significant diagnostics from this  hospitalization (including imaging, microbiology, ancillary and laboratory) are listed below for reference.    Significant Diagnostic Studies: Dg Chest 2 View  04/16/2012  *RADIOLOGY REPORT*  Clinical Data: Chest pain.  Neck pain.  Tobacco use.  CHEST - 2 VIEW  Comparison: None.  Findings: Scattered pulmonary nodules and pulmonary masses are observed bilaterally.  An index mass in the right to middle lobe measures 5.4 x 3.2 by 5.2 cm.  Mass-like scarring noted along the left lung apex.  No cardiomegaly noted, but the aortic arch seems to be right-sided.  Unusual lucency at the T12-L1 level on the frontal projection may to be due to bowel gas although a vertebral anomaly cannot be totally excluded.  No pleural effusion is identified.  IMPRESSION:  1.  Scattered nodules and masses throughout both lungs.  Appearance favors metastatic malignancy to both lungs.  Fungal pneumonia and rheumatoid nodules are less likely differential diagnostic considerations.  CT chest with contrast is recommended for further workup, and considering the bilaterality of disease, imaging of the abdomen and pelvis may be prudent as well. 2.  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 3.  Lucency at the thoracolumbar junction on the frontal projection is somewhat unusual but probably simply due to bowel gas.  This could be further characterized on the  CT exam to ensure that does not represent a vertebral anomaly.   Original Report Authenticated By: Gaylyn Rong, M.D.    Ct Head W Wo Contrast  04/16/2012  *RADIOLOGY REPORT*  Clinical Data: Forgetful; known lung mass.  Assess for brain metastases.  CT HEAD WITHOUT AND WITH CONTRAST  Technique:  Contiguous axial images were obtained from the base of the skull through the vertex without and with intravenous contrast.  Contrast: OMNIPAQUE IOHEXOL 300 MG/ML  SOLN  Comparison: None.  Findings: Evaluation is suboptimal due to motion artifact. However, multiple prominent  areas of decreased attenuation are seen throughout the cerebral hemispheres and at the left side of the cerebellum, compatible with multiple metastases.  Identifiable masses measure perhaps 2.0 cm at the high left frontal lobe, 2.5 cm at the expected location of the frontal horn of the left lateral ventricle, 1.5 cm at the left occipital lobe, 1.5 cm at the left cerebellar hemisphere and 1.7 cm at the posterior right parietal lobe.  There is perhaps 7 mm of rightward midline shift, though this is difficult to fully characterize.  There is no evidence of acute infarction, or intra- or extra-axial hemorrhage on CT.  There is no evidence of fracture; visualized osseous structures are unremarkable in appearance.  The visualized portions of the orbits are within normal limits.  The paranasal sinuses and mastoid air cells are well-aerated.  No significant soft tissue abnormalities are seen.  IMPRESSION:  1.  Multiple cerebral and cerebellar metastases noted, with extensive areas of decreased attenuation, effacement of the frontal horn of the left lateral ventricle and perhaps 7 mm of rightward midline shift. 2.  No definite evidence of intracranial hemorrhage.  These results were called by telephone on 04/16/2012 at 09:21 p.m. to Earley Favor PA, who verbally acknowledged these results.   Original Report Authenticated By: Tonia Ghent, M.D.    Ct Chest W Contrast  04/16/2012  *RADIOLOGY REPORT*  Clinical Data:  Assess for chest and abdominal metastatic disease; known lung masses.  CT CHEST, ABDOMEN AND PELVIS WITH CONTRAST  Technique:  Multidetector CT imaging of the chest, abdomen and pelvis was performed following the standard protocol during bolus administration of intravenous contrast.  Contrast: OMNIPAQUE IOHEXOL 300 MG/ML  SOLN  Comparison:   None.  CT CHEST  Findings:  There are two dominant masses within the lungs, with a third somewhat smaller mass also seen.  The largest mass is noted at the left upper  lobe, with significant architectural distortion; it measures approximately 8.6 x 4.4 x 6.5 cm, with partial occlusion of the bronchioles to the left upper lobe, and marked mass effect on the left main pulmonary artery.  A large 6.2 x 3.7 x 3.9 cm mass is noted within the right middle lobe.  There is also a spiculated 3.1 x 3.1 x 2.4 cm mass at the right upper lobe, with significant architectural distortion.  Innumerable smaller metastases are noted throughout both lungs.  No pleural effusion is identified.  There is mild underlying emphysema, noted particularly at the right upper lobe.  No pneumothorax is seen.  Scattered coronary artery calcifications are noted.  The patient's left upper lobe mass extends into the mediastinum, with irregular lobulations.  It remains a few millimeters from both the trachea and the descending thoracic aorta, though slightly closer to the course of the left subclavian artery.  Scattered metastases are noted within the mediastinum, demonstrating centrally decreased attenuation.  These are noted at the right paratracheal and  periaortic regions, measuring up to 1.3 cm in short axis.  No pericardial effusion is identified. Scattered calcific atherosclerotic disease is noted along the aortic arch.  The great vessels are grossly unremarkable in appearance.  Minimal hypodensity at the left thyroid lobe is nonspecific in appearance, and likely benign given its size.  An enlarged 1.4 cm left supraclavicular node is seen.  Scattered axillary nodes remain normal in size.  No acute osseous abnormalities are seen.  IMPRESSION:  1.  Two dominant masses within the lungs, at the left upper lobe and right middle lobe.  Smaller spiculated mass at the right upper lobe.  Significant associated architectural distortion; the left upper lobe mass extends into the mediastinum.  Partial occlusion of the bronchus to the left upper lobe, and significant mass effect on the left main pulmonary artery. 2.   Innumerable small metastases noted throughout both lungs. 3.  Mild underlying emphysema, particularly at the right upper lobe. 4.  Scattered coronary artery calcifications seen. 5.  Scattered metastases within the mediastinum, at the right paratracheal and periaortic regions, measuring up to 1.3 cm in short axis. 6.  1.4 cm centrally hypodense left supraclavicular node, concerning for metastatic disease. 7.  Minimal hypodensity at the left thyroid lobe is nonspecific, and likely benign given its size.  CT ABDOMEN AND PELVIS  Findings:  A few hypodensities within the liver are nonspecific but likely reflect small cysts.  The liver is otherwise unremarkable in appearance.  Two vague hypodense lesions are noted within the spleen, measuring 2.0 and 1.5 cm, concerning for metastases.  There is a large complex heterogeneous left adrenal mass, measuring 4.6 cm in size, concerning for metastatic disease.  The right adrenal gland is unremarkable in appearance.  A large stone is noted within the gallbladder; the gallbladder is otherwise grossly unremarkable.  The pancreas is grossly unremarkable.  A nonobstructing 7 mm stone is noted at the interpole region of the left kidney.  Scattered bilateral renal cysts are seen, measuring up to 1.7 cm in size. There is no evidence of hydronephrosis.  No obstructing ureteral stones are seen.  No perinephric stranding is appreciated.  No free fluid is identified.  The small bowel is unremarkable in appearance.  The stomach is within normal limits.  No acute vascular abnormalities are seen.  Scattered calcification is noted along the abdominal aorta and its branches.  The appendix is normal in caliber, without evidence for appendicitis.  The colon is unremarkable in appearance.  The bladder is mildly distended and grossly unremarkable in appearance.  The patient is status post hysterectomy.  No suspicious adnexal masses are seen; the left ovary is unremarkable in appearance.  No inguinal  lymphadenopathy is seen.  No acute osseous abnormalities are identified.  There is nonspecific sclerosis at the pubic symphysis, and vacuum phenomenon and mild sclerotic change are noted at the sacroiliac joints bilaterally.  Degenerative change is noted along the lower lumbar spine.  IMPRESSION:  1.  Large complex left adrenal mass, measuring 4.6 cm, concerning for metastatic disease. 2.  Two vague hypodensities within the spleen, concerning for metastatic disease. 3.  Small hepatic and renal hypodensities likely reflect cysts. 4.  Cholelithiasis; gallbladder otherwise unremarkable in appearance. 5.  Nonobstructing 7 mm stone at the interpole region of the left kidney. 6.  Scattered calcification along the abdominal aorta and its branches. 7.  No definite evidence for osseous metastatic disease; nonspecific sclerosis at the pubic symphysis, and mild degenerative change along the lower lumbar spine  and at the sacroiliac joints.   Original Report Authenticated By: Tonia Ghent, M.D.    Ct Abdomen Pelvis W Contrast  04/16/2012  *RADIOLOGY REPORT*  Clinical Data:  Assess for chest and abdominal metastatic disease; known lung masses.  CT CHEST, ABDOMEN AND PELVIS WITH CONTRAST  Technique:  Multidetector CT imaging of the chest, abdomen and pelvis was performed following the standard protocol during bolus administration of intravenous contrast.  Contrast: OMNIPAQUE IOHEXOL 300 MG/ML  SOLN  Comparison:   None.  CT CHEST  Findings:  There are two dominant masses within the lungs, with a third somewhat smaller mass also seen.  The largest mass is noted at the left upper lobe, with significant architectural distortion; it measures approximately 8.6 x 4.4 x 6.5 cm, with partial occlusion of the bronchioles to the left upper lobe, and marked mass effect on the left main pulmonary artery.  A large 6.2 x 3.7 x 3.9 cm mass is noted within the right middle lobe.  There is also a spiculated 3.1 x 3.1 x 2.4 cm mass at the  right upper lobe, with significant architectural distortion.  Innumerable smaller metastases are noted throughout both lungs.  No pleural effusion is identified.  There is mild underlying emphysema, noted particularly at the right upper lobe.  No pneumothorax is seen.  Scattered coronary artery calcifications are noted.  The patient's left upper lobe mass extends into the mediastinum, with irregular lobulations.  It remains a few millimeters from both the trachea and the descending thoracic aorta, though slightly closer to the course of the left subclavian artery.  Scattered metastases are noted within the mediastinum, demonstrating centrally decreased attenuation.  These are noted at the right paratracheal and periaortic regions, measuring up to 1.3 cm in short axis.  No pericardial effusion is identified. Scattered calcific atherosclerotic disease is noted along the aortic arch.  The great vessels are grossly unremarkable in appearance.  Minimal hypodensity at the left thyroid lobe is nonspecific in appearance, and likely benign given its size.  An enlarged 1.4 cm left supraclavicular node is seen.  Scattered axillary nodes remain normal in size.  No acute osseous abnormalities are seen.  IMPRESSION:  1.  Two dominant masses within the lungs, at the left upper lobe and right middle lobe.  Smaller spiculated mass at the right upper lobe.  Significant associated architectural distortion; the left upper lobe mass extends into the mediastinum.  Partial occlusion of the bronchus to the left upper lobe, and significant mass effect on the left main pulmonary artery. 2.  Innumerable small metastases noted throughout both lungs. 3.  Mild underlying emphysema, particularly at the right upper lobe. 4.  Scattered coronary artery calcifications seen. 5.  Scattered metastases within the mediastinum, at the right paratracheal and periaortic regions, measuring up to 1.3 cm in short axis. 6.  1.4 cm centrally hypodense left  supraclavicular node, concerning for metastatic disease. 7.  Minimal hypodensity at the left thyroid lobe is nonspecific, and likely benign given its size.  CT ABDOMEN AND PELVIS  Findings:  A few hypodensities within the liver are nonspecific but likely reflect small cysts.  The liver is otherwise unremarkable in appearance.  Two vague hypodense lesions are noted within the spleen, measuring 2.0 and 1.5 cm, concerning for metastases.  There is a large complex heterogeneous left adrenal mass, measuring 4.6 cm in size, concerning for metastatic disease.  The right adrenal gland is unremarkable in appearance.  A large stone is noted within the  gallbladder; the gallbladder is otherwise grossly unremarkable.  The pancreas is grossly unremarkable.  A nonobstructing 7 mm stone is noted at the interpole region of the left kidney.  Scattered bilateral renal cysts are seen, measuring up to 1.7 cm in size. There is no evidence of hydronephrosis.  No obstructing ureteral stones are seen.  No perinephric stranding is appreciated.  No free fluid is identified.  The small bowel is unremarkable in appearance.  The stomach is within normal limits.  No acute vascular abnormalities are seen.  Scattered calcification is noted along the abdominal aorta and its branches.  The appendix is normal in caliber, without evidence for appendicitis.  The colon is unremarkable in appearance.  The bladder is mildly distended and grossly unremarkable in appearance.  The patient is status post hysterectomy.  No suspicious adnexal masses are seen; the left ovary is unremarkable in appearance.  No inguinal lymphadenopathy is seen.  No acute osseous abnormalities are identified.  There is nonspecific sclerosis at the pubic symphysis, and vacuum phenomenon and mild sclerotic change are noted at the sacroiliac joints bilaterally.  Degenerative change is noted along the lower lumbar spine.  IMPRESSION:  1.  Large complex left adrenal mass, measuring 4.6  cm, concerning for metastatic disease. 2.  Two vague hypodensities within the spleen, concerning for metastatic disease. 3.  Small hepatic and renal hypodensities likely reflect cysts. 4.  Cholelithiasis; gallbladder otherwise unremarkable in appearance. 5.  Nonobstructing 7 mm stone at the interpole region of the left kidney. 6.  Scattered calcification along the abdominal aorta and its branches. 7.  No definite evidence for osseous metastatic disease; nonspecific sclerosis at the pubic symphysis, and mild degenerative change along the lower lumbar spine and at the sacroiliac joints.   Original Report Authenticated By: Tonia Ghent, M.D.     Microbiology: Recent Results (from the past 240 hour(s))  SURGICAL PCR SCREEN     Status: None   Collection Time    04/18/12  4:26 AM      Result Value Range Status   MRSA, PCR NEGATIVE  NEGATIVE Final   Staphylococcus aureus NEGATIVE  NEGATIVE Final   Comment:            The Xpert SA Assay (FDA     approved for NASAL specimens     in patients over 71 years of age),     is one component of     a comprehensive surveillance     program.  Test performance has     been validated by [employer] for patients greater     than or equal to 58 year old.     It is not intended     to diagnose infection nor to     guide or monitor treatment.     Labs: Basic Metabolic Panel:  Recent Labs Lab 04/16/12 1924 04/17/12 0423  NA 138 137  K 3.5 3.9  CL 102 102  CO2 28 27  GLUCOSE 96 142*  BUN 8 6  CREATININE 0.71 0.62  CALCIUM 9.5 9.1   CBC:  Recent Labs Lab 04/16/12 1924 04/17/12 0423  WBC 8.6 7.4  HGB 14.6 13.7  HCT 41.7 42.4  MCV 86.2 88.3  PLT 380 419*   Cardiac Enzymes:  Recent Labs Lab 04/16/12 1924 04/16/12 2340  TROPONINI <0.30 <0.30   CBG:  Recent Labs Lab 04/18/12 1157 04/18/12 1641 04/18/12 2049 04/19/12 0743 04/19/12 1653  GLUCAP 135* 124* 125* 112* 163*  SignedPamella Pert  [facility] 04/19/2012, 6:52 PM

## 2012-04-19 NOTE — Progress Notes (Signed)
Patient ID: Isabella Carpenter, female   DOB: [birth year], 67 y.o.   MRN: [id number] 1 Day Post-Op  Subjective: No complaints, area sore last night but feels ok today  Objective: Vital signs in last 24 hours: Temp:  [97.6 F (36.4 C)-98.2 F (36.8 C)] 98.2 F (36.8 C) (04/09 0745) Pulse Rate:  [54-66] 54 (04/09 0745) Resp:  [16-20] 20 (04/09 0745) BP: (145-161)/(61-97) 145/68 mmHg (04/09 0745) SpO2:  [91 %-100 %] 91 % (04/09 0745) Weight:  [131 lb 6.3 oz (59.6 kg)] 131 lb 6.3 oz (59.6 kg) (04/09 0745) Last BM Date: 04/16/12  Intake/Output from previous day: 04/08 0701 - 04/09 0700 In: 2365 [P.O.:540; I.V.:1825] Out: 2370 [Urine:2350; Blood:20] Intake/Output this shift:    PE:  Left subclavicular incision c/d/i  Lab Results:   Recent Labs  04/16/12 1924 04/17/12 0423  WBC 8.6 7.4  HGB 14.6 13.7  HCT 41.7 42.4  PLT 380 419*   BMET  Recent Labs  04/16/12 1924 04/17/12 0423  NA 138 137  K 3.5 3.9  CL 102 102  CO2 28 27  GLUCOSE 96 142*  BUN 8 6  CREATININE 0.71 0.62  CALCIUM 9.5 9.1   PT/INR No results found for this basename: LABPROT, INR,  in the last 72 hours CMP     Component Value Date/Time   NA 137 04/17/2012 0423   K 3.9 04/17/2012 0423   CL 102 04/17/2012 0423   CO2 27 04/17/2012 0423   GLUCOSE 142* 04/17/2012 0423   BUN 6 04/17/2012 0423   CREATININE 0.62 04/17/2012 0423   CALCIUM 9.1 04/17/2012 0423   GFRNONAA >90 04/17/2012 0423   GFRAA >90 04/17/2012 0423   Lipase  No results found for this basename: lipase       Studies/Results: No results found.  Anti-infectives: Anti-infectives   Start     Dose/Rate Route Frequency Ordered Stop   04/18/12 0745  ceFAZolin (ANCEF) IVPB 2 g/50 mL premix  Status:  Discontinued     2 g 100 mL/hr over 30 Minutes Intravenous  Once 04/18/12 0740 04/18/12 0758       Assessment/Plan S/p left supraclavicular lymph node biopsy: dressing removed, incision looks good, no dressing needed, ok to shower starting tomorrow.   Will follow up PRN   LOS: 3 days    Via Rosado 04/19/2012

## 2012-04-19 NOTE — Progress Notes (Signed)
PT Cancellation Note  ___Treatment cancelled today due to medical issues with patient which prohibited therapy  _X_ Treatment cancelled today due to patient receiving procedure or test .........Marland Kitchenbiopsy  ___ Treatment cancelled today due to patient's refusal to participate   ___ Treatment cancelled today due to  Felecia Shelling  PTA [facility]  Acute  Rehab Pager      [phone]

## 2012-04-19 NOTE — Progress Notes (Signed)
[facility] PROGRESS NOTE  Starlit Raburn ZOX:[id number] DOB: August 17, 1945 DOA: 04/16/2012 PCP: No PCP Per Patient  Assessment/Plan: Brief HPI:  Isabella Carpenter is a 67 y.o. Caucasian female with no significant past medical history and has not seen a physician for a long time who was brought in by family for the above reasons. Over the last week family has noted that she has had intermittent episodes of confusion. Per daughter, on 04/14/2012, patient appeared confused on the telephone and had only short one-word yes or no responses. On arrival to ED, she had imaging of her brain showed metastatic disease in her brain, chest, and abdomen. Hospitalist service was consulted for further care and management. Surgery consulted for supraclavicular node biopsy.   Altered mental status : appears to have resolved. Probably from brain metastases. 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 Discussed with Dr. Jordan Likes, neurosurgery, given patient's extensive metastatic disease did not think he could offer the patient anything from a neurosurgical standpoint as she does not have any deficits at this time. Dr. Jordan Likes, recommended dexamethasone 10 mg IV every 6 hours for 4 doses then dexamethasone 4 mg by mouth every 6 hours and recommended whole brain radiation. - Radiation oncology to begin XRT soon - appreciate timeline from Rad Onc; hopefully will be able to d/c patient 4/10. - Oncology consult from Dr Gwenyth Bouillon requested.   Chest pain  Initial troponin negative. EKG not concerning for any ischemic changes. Continue to monitor on telemetry, cycle cardiac enzymes negative. Suspect patient's chest pain is likely related to metastatic disease in her lungs as imaging showed 2 dominant mass is in the left upper and right middle lobe. On the left there is a partial occlusion of the bronchus to the left upper  lobe with significant mass effect on the left main pulmonary artery.   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 Patient has palpable left supraclavicular lymph nodes which may be easily biopsied. General surgery consultation requested for biopsy, she underwent supraclvicular LN biopsy on 04/18/12.  - biopsy pending  Elevated blood pressure  Likely related to pain and anxiety. Continue to monitor. When necessary hydralazine for systolic blood pressure greater than 160.   Tobacco abuse  Counseled the patient on cessation.   Prophylaxis  Lovenox.  Code Status: DNR Family Communication: DAUGHTER at bedside Disposition Plan: pending.    Consultants:  general surgery  Oncology  Radiation oncology.  HPI/Subjective: Comfortable, no new complaints.   Objective: Filed Vitals:   04/18/12 1300 04/18/12 2100 04/19/12 0745 04/19/12 1126  BP: 153/61 154/70 145/68   Pulse: 66 58 54   Temp: 97.6 F (36.4 C) 98.1 F (36.7 C) 98.2 F (36.8 C)   TempSrc: Oral Oral Oral   Resp: 18 20 20    Height:      Weight:   59.6 kg (131 lb 6.3 oz)   SpO2: 100% 94% 91% 97%    Intake/Output Summary (Last 24 hours) at 04/19/12 1341 Last data filed at 04/19/12 0435  Gross per 24 hour  Intake   1995 ml  Output   2150 ml  Net   -155 ml   Filed Weights   04/17/12 0038 04/18/12 0614 04/19/12 0745  Weight: 59.6 kg (131 lb 6.3 oz) 60.2 kg (132 lb 11.5 oz) 59.6 kg (131 lb 6.3 oz)  Exam: Alert afebrile comfortable, oriented to place person and time.  CV: S1 and S2  Lungs: Clear to ascultation bilaterally  Abdomen: Soft, Nontender, Nondistended, +bowel sounds.  Ext: Good pulses. Trace edema. No clubbing or cyanosis noted.  Neuro: Cranial Nerves II-XII grossly intact. Has 5/5 motor strength in upper and lower extremities.  Chest: No reproducible pain with palpation.   Data Reviewed: Basic Metabolic  Panel:  Recent Labs Lab 04/16/12 1924 04/17/12 0423  NA 138 137  K 3.5 3.9  CL 102 102  CO2 28 27  GLUCOSE 96 142*  BUN 8 6  CREATININE 0.71 0.62  CALCIUM 9.5 9.1   CBC:  Recent Labs Lab 04/16/12 1924 04/17/12 0423  WBC 8.6 7.4  HGB 14.6 13.7  HCT 41.7 42.4  MCV 86.2 88.3  PLT 380 419*   Cardiac Enzymes:  Recent Labs Lab 04/16/12 1924 04/16/12 2340  TROPONINI <0.30 <0.30   CBG:  Recent Labs Lab 04/18/12 0848 04/18/12 1157 04/18/12 1641 04/18/12 2049 04/19/12 0743  GLUCAP 119* 135* 124* 125* 112*    Recent Results (from the past 240 hour(s))  SURGICAL PCR SCREEN     Status: None   Collection Time    04/18/12  4:26 AM      Result Value Range Status   MRSA, PCR NEGATIVE  NEGATIVE Final   Staphylococcus aureus NEGATIVE  NEGATIVE Final   Comment:            The Xpert SA Assay (FDA     approved for NASAL specimens     in patients over 5 years of age),     is one component of     a comprehensive surveillance     program.  Test performance has     been validated by [employer] for patients greater     than or equal to 66 year old.     It is not intended     to diagnose infection nor to     guide or monitor treatment.     Studies: No results found.  Scheduled Meds: . dexamethasone  4 mg Oral Q6H  . docusate sodium  100 mg Oral BID  . feeding supplement  237 mL Oral BID BM  . multivitamin with minerals  1 tablet Oral Daily  . senna  1 tablet Oral BID   Continuous Infusions: . sodium chloride 75 mL/hr at 04/19/12 0435    Principal Problem:   Acute delirium Active Problems:   Chest pain   Tobacco abuse   Elevated blood pressure   Metastasis of unknown primary        [name]  [facility] Pager [phone]. If 7PM-7AM, please contact night-coverage at [email or web address], password [facility] 04/19/2012, 1:41 PM  LOS: 3 days

## 2012-04-19 NOTE — Progress Notes (Signed)
Patient needed follow-up plan upon discharge for primary care. Notified Dr. Elvera Lennox and he would like patient to follow-up with her primary care physician in the next week. Informed the patient and her daughter of this. Patient has not had a primary care physician in the last 12 years. Notified CM on call Olegario Messier who gave information on where the patient can get a primary care physician with her [employer] insurance. Provided this information to the patient and she choose to call the hotline number on her [employer] card to ask for physicians in the area that she could use for primary care. Patient will choose a primary care physician and will make an appointment in the next week for a follow-up visit and will also be going for radiation tomorrow at the cancer center. Erskin Burnet RN

## 2012-04-19 NOTE — Progress Notes (Signed)
[facility] [phone]   [phone]  CONSULT NOTE  REFERRING PHYSICIAN: Dr. Blake Divine  REASON FOR CONSULTATION:  67 years old white female with questionable metastatic lung cancer.  HPI Davi Rotan is a 67 y.o. female with no significant past medical history but long history of smoking. The patient's daughter is at the bedside mentions that she was contacted by a friend of her mother on facebook to inform her about changes in her mother's mental status. The patient was taken to the emergency Department at [facility]. She was complaining of the chest pain as well as confusion. CT of the head, chest, abdomen and pelvis performed on 11/16/2012 showed multiple cerebral and cerebellar metastases noted with extensive areas of decreased attenuation, effacement of the frontal lobes the left lateral ventricle and perhaps 7 mm on) and midline shift but no evidence of intracranial hemorrhage. CT of the chest showed 2 dominant masses within the lungs, at the left upper lobe measuring approximately 8.6 x 4.4 x 6.5 CM with partial occlusion of the bronchus to the left upper lobe and marked mass effect on the left main pulmonary artery and right middle lobe measuring 6.2 x 3.7 x 3.9 CM as well as spiculated 3.1 x 3.1 x 2.4 CM mass at the right upper lobe with significant architectural distortion. The patient was also found to have innumerable small metastases noted throughout both lungs as well as scattered metastasis within the mediastinum, at the right peritracheal and periaortic regions measuring up to 1.3 CM in short axis. There was also a 1.4 CM centrally hypodense left supraclavicular node concerning for metastatic disease. In the abdomen there is large complex left adrenal mass, measuring 4.6 CM concerning for metastatic disease as well as vague hypodensities within the spleen concerning for metastatic disease and is now hepatic and renal hypodensities likely  reflecting cysts. There was no definite evidence for osseous metastatic disease. On 04/18/2012 the patient underwent left supraclavicular excisional lymph node biopsy under the care of Dr. Abbey Chatters. The final pathology showed malignant cells but further immunohistochemical stains are still pending.  The patient was started on Decadron for her brain metastases and vasogenic edema. She was seen by Dr. described and the expected to start whole brain irradiation today. She continues to have shortness of breath at baseline and increased with exertion. She has no significant weight loss or night sweats. She has no hemoptysis.    @SFHPI @  Past Medical History  Diagnosis Date  . Cancer     Past Surgical History  Procedure Laterality Date  . Abdominal hysterectomy      Family History  Problem Relation Age of Onset  . Heart attack Father   . Kidney cancer Father     Social History History  Substance Use Topics  . Smoking status: Current Every Day Smoker -- 2.00 packs/day for 30 years    Types: Cigarettes  . Smokeless tobacco: Not on file  . Alcohol Use: Not on file    No Known Allergies  Current Facility-Administered Medications  Medication Dose Route Frequency Provider Last Rate Last Dose  . 0.9 %  sodium chloride infusion   Intravenous Continuous Cristal Ford, MD 75 mL/hr at 04/19/12 0435    . acetaminophen (TYLENOL) tablet 650 mg  650 mg Oral Q6H PRN Cristal Ford, MD       Or  . acetaminophen (TYLENOL) suppository 650 mg  650 mg Rectal Q6H PRN Cristal Ford, MD      .  bisacodyl (DULCOLAX) suppository 10 mg  10 mg Rectal Daily PRN Cristal Ford, MD      . dexamethasone (DECADRON) tablet 4 mg  4 mg Oral Q6H Cristal Ford, MD   4 mg at 04/19/12 0510  . docusate sodium (COLACE) capsule 100 mg  100 mg Oral BID Cristal Ford, MD   100 mg at 04/19/12 0900  . feeding supplement (ENSURE COMPLETE) liquid 237 mL  237 mL Oral BID BM Lorraine Lax, RD   237 mL at 04/19/12 0900    . hydrALAZINE (APRESOLINE) injection 10 mg  10 mg Intravenous Q6H PRN Cristal Ford, MD      . HYDROcodone-acetaminophen (NORCO/VICODIN) 5-325 MG per tablet 1-2 tablet  1-2 tablet Oral Q4H PRN Adolph Pollack, MD   2 tablet at 04/19/12 0246  . HYDROmorphone (DILAUDID) injection 0.5-1 mg  0.5-1 mg Intravenous Q4H PRN Cristal Ford, MD   1 mg at 04/18/12 2217  . multivitamin with minerals tablet 1 tablet  1 tablet Oral Daily Lorraine Lax, RD   1 tablet at 04/19/12 0900  . ondansetron (ZOFRAN) tablet 4 mg  4 mg Oral Q6H PRN Cristal Ford, MD       Or  . ondansetron (ZOFRAN) injection 4 mg  4 mg Intravenous Q6H PRN Cristal Ford, MD      . senna (SENOKOT) tablet 8.6 mg  1 tablet Oral BID Cristal Ford, MD   8.6 mg at 04/19/12 0900    Review of Systems  A comprehensive review of systems was negative except for: Constitutional: positive for anorexia and fatigue Respiratory: positive for cough and dyspnea on exertion Neurological: positive for memory problems and weakness  Physical Exam  AVW:UJWJX, healthy, no distress, well nourished and well developed SKIN: skin color, texture, turgor are normal HEAD: Normocephalic, No masses, lesions, tenderness or abnormalities EYES: normal, PERRLA EARS: External ears normal OROPHARYNX:no exudate and no erythema  NECK: supple, no adenopathy LYMPH:  no palpable lymphadenopathy, no hepatosplenomegaly BREAST:not examined LUNGS: clear to auscultation  HEART: regular rate & rhythm, no murmurs and no gallops ABDOMEN:abdomen soft, non-tender, normal bowel sounds and no masses or organomegaly BACK: Back symmetric, no curvature. EXTREMITIES:no joint deformities, effusion, or inflammation, no edema, no skin discoloration, no clubbing  NEURO: alert & oriented x 3 with fluent speech, no focal motor/sensory deficits  PERFORMANCE STATUS: ECOG 1  LABORATORY DATA: Lab Results  Component Value Date   WBC 7.4 04/17/2012   HGB 13.7 04/17/2012   HCT 42.4  04/17/2012   MCV 88.3 04/17/2012   PLT 419* 04/17/2012    @LASTCHEM @  RADIOGRAPHIC STUDIES: Dg Chest 2 View  04/16/2012  *RADIOLOGY REPORT*  Clinical Data: Chest pain.  Neck pain.  Tobacco use.  CHEST - 2 VIEW  Comparison: None.  Findings: Scattered pulmonary nodules and pulmonary masses are observed bilaterally.  An index mass in the right to middle lobe measures 5.4 x 3.2 by 5.2 cm.  Mass-like scarring noted along the left lung apex.  No cardiomegaly noted, but the aortic arch seems to be right-sided.  Unusual lucency at the T12-L1 level on the frontal projection may to be due to bowel gas although a vertebral anomaly cannot be totally excluded.  No pleural effusion is identified.  IMPRESSION:  1.  Scattered nodules and masses throughout both lungs.  Appearance favors metastatic malignancy to both lungs.  Fungal pneumonia and rheumatoid nodules are less likely differential diagnostic considerations.  CT chest with contrast  is recommended for further workup, and considering the bilaterality of disease, imaging of the abdomen and pelvis may be prudent as well. 2.  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 3.  Lucency at the thoracolumbar junction on the frontal projection is somewhat unusual but probably simply due to bowel gas.  This could be further characterized on the CT exam to ensure that does not represent a vertebral anomaly.   Original Report Authenticated By: Gaylyn Rong, M.D.    Ct Head W Wo Contrast  04/16/2012  *RADIOLOGY REPORT*  Clinical Data: Forgetful; known lung mass.  Assess for brain metastases.  CT HEAD WITHOUT AND WITH CONTRAST  Technique:  Contiguous axial images were obtained from the base of the skull through the vertex without and with intravenous contrast.  Contrast: OMNIPAQUE IOHEXOL 300 MG/ML  SOLN  Comparison: None.  Findings: Evaluation is suboptimal due to motion artifact. However, multiple prominent areas of decreased attenuation are seen throughout the  cerebral hemispheres and at the left side of the cerebellum, compatible with multiple metastases.  Identifiable masses measure perhaps 2.0 cm at the high left frontal lobe, 2.5 cm at the expected location of the frontal horn of the left lateral ventricle, 1.5 cm at the left occipital lobe, 1.5 cm at the left cerebellar hemisphere and 1.7 cm at the posterior right parietal lobe.  There is perhaps 7 mm of rightward midline shift, though this is difficult to fully characterize.  There is no evidence of acute infarction, or intra- or extra-axial hemorrhage on CT.  There is no evidence of fracture; visualized osseous structures are unremarkable in appearance.  The visualized portions of the orbits are within normal limits.  The paranasal sinuses and mastoid air cells are well-aerated.  No significant soft tissue abnormalities are seen.  IMPRESSION:  1.  Multiple cerebral and cerebellar metastases noted, with extensive areas of decreased attenuation, effacement of the frontal horn of the left lateral ventricle and perhaps 7 mm of rightward midline shift. 2.  No definite evidence of intracranial hemorrhage.  These results were called by telephone on 04/16/2012 at 09:21 p.m. to Earley Favor PA, who verbally acknowledged these results.   Original Report Authenticated By: Tonia Ghent, M.D.    Ct Chest W Contrast  04/16/2012  *RADIOLOGY REPORT*  Clinical Data:  Assess for chest and abdominal metastatic disease; known lung masses.  CT CHEST, ABDOMEN AND PELVIS WITH CONTRAST  Technique:  Multidetector CT imaging of the chest, abdomen and pelvis was performed following the standard protocol during bolus administration of intravenous contrast.  Contrast: OMNIPAQUE IOHEXOL 300 MG/ML  SOLN  Comparison:   None.  CT CHEST  Findings:  There are two dominant masses within the lungs, with a third somewhat smaller mass also seen.  The largest mass is noted at the left upper lobe, with significant architectural distortion; it  measures approximately 8.6 x 4.4 x 6.5 cm, with partial occlusion of the bronchioles to the left upper lobe, and marked mass effect on the left main pulmonary artery.  A large 6.2 x 3.7 x 3.9 cm mass is noted within the right middle lobe.  There is also a spiculated 3.1 x 3.1 x 2.4 cm mass at the right upper lobe, with significant architectural distortion.  Innumerable smaller metastases are noted throughout both lungs.  No pleural effusion is identified.  There is mild underlying emphysema, noted particularly at the right upper lobe.  No pneumothorax is seen.  Scattered coronary artery calcifications are noted.  The  patient's left upper lobe mass extends into the mediastinum, with irregular lobulations.  It remains a few millimeters from both the trachea and the descending thoracic aorta, though slightly closer to the course of the left subclavian artery.  Scattered metastases are noted within the mediastinum, demonstrating centrally decreased attenuation.  These are noted at the right paratracheal and periaortic regions, measuring up to 1.3 cm in short axis.  No pericardial effusion is identified. Scattered calcific atherosclerotic disease is noted along the aortic arch.  The great vessels are grossly unremarkable in appearance.  Minimal hypodensity at the left thyroid lobe is nonspecific in appearance, and likely benign given its size.  An enlarged 1.4 cm left supraclavicular node is seen.  Scattered axillary nodes remain normal in size.  No acute osseous abnormalities are seen.  IMPRESSION:  1.  Two dominant masses within the lungs, at the left upper lobe and right middle lobe.  Smaller spiculated mass at the right upper lobe.  Significant associated architectural distortion; the left upper lobe mass extends into the mediastinum.  Partial occlusion of the bronchus to the left upper lobe, and significant mass effect on the left main pulmonary artery. 2.  Innumerable small metastases noted throughout both lungs.  3.  Mild underlying emphysema, particularly at the right upper lobe. 4.  Scattered coronary artery calcifications seen. 5.  Scattered metastases within the mediastinum, at the right paratracheal and periaortic regions, measuring up to 1.3 cm in short axis. 6.  1.4 cm centrally hypodense left supraclavicular node, concerning for metastatic disease. 7.  Minimal hypodensity at the left thyroid lobe is nonspecific, and likely benign given its size.  CT ABDOMEN AND PELVIS  Findings:  A few hypodensities within the liver are nonspecific but likely reflect small cysts.  The liver is otherwise unremarkable in appearance.  Two vague hypodense lesions are noted within the spleen, measuring 2.0 and 1.5 cm, concerning for metastases.  There is a large complex heterogeneous left adrenal mass, measuring 4.6 cm in size, concerning for metastatic disease.  The right adrenal gland is unremarkable in appearance.  A large stone is noted within the gallbladder; the gallbladder is otherwise grossly unremarkable.  The pancreas is grossly unremarkable.  A nonobstructing 7 mm stone is noted at the interpole region of the left kidney.  Scattered bilateral renal cysts are seen, measuring up to 1.7 cm in size. There is no evidence of hydronephrosis.  No obstructing ureteral stones are seen.  No perinephric stranding is appreciated.  No free fluid is identified.  The small bowel is unremarkable in appearance.  The stomach is within normal limits.  No acute vascular abnormalities are seen.  Scattered calcification is noted along the abdominal aorta and its branches.  The appendix is normal in caliber, without evidence for appendicitis.  The colon is unremarkable in appearance.  The bladder is mildly distended and grossly unremarkable in appearance.  The patient is status post hysterectomy.  No suspicious adnexal masses are seen; the left ovary is unremarkable in appearance.  No inguinal lymphadenopathy is seen.  No acute osseous abnormalities  are identified.  There is nonspecific sclerosis at the pubic symphysis, and vacuum phenomenon and mild sclerotic change are noted at the sacroiliac joints bilaterally.  Degenerative change is noted along the lower lumbar spine.  IMPRESSION:  1.  Large complex left adrenal mass, measuring 4.6 cm, concerning for metastatic disease. 2.  Two vague hypodensities within the spleen, concerning for metastatic disease. 3.  Small hepatic and renal hypodensities likely  reflect cysts. 4.  Cholelithiasis; gallbladder otherwise unremarkable in appearance. 5.  Nonobstructing 7 mm stone at the interpole region of the left kidney. 6.  Scattered calcification along the abdominal aorta and its branches. 7.  No definite evidence for osseous metastatic disease; nonspecific sclerosis at the pubic symphysis, and mild degenerative change along the lower lumbar spine and at the sacroiliac joints.   Original Report Authenticated By: Tonia Ghent, M.D.    Ct Abdomen Pelvis W Contrast  04/16/2012  *RADIOLOGY REPORT*  Clinical Data:  Assess for chest and abdominal metastatic disease; known lung masses.  CT CHEST, ABDOMEN AND PELVIS WITH CONTRAST  Technique:  Multidetector CT imaging of the chest, abdomen and pelvis was performed following the standard protocol during bolus administration of intravenous contrast.  Contrast: OMNIPAQUE IOHEXOL 300 MG/ML  SOLN  Comparison:   None.  CT CHEST  Findings:  There are two dominant masses within the lungs, with a third somewhat smaller mass also seen.  The largest mass is noted at the left upper lobe, with significant architectural distortion; it measures approximately 8.6 x 4.4 x 6.5 cm, with partial occlusion of the bronchioles to the left upper lobe, and marked mass effect on the left main pulmonary artery.  A large 6.2 x 3.7 x 3.9 cm mass is noted within the right middle lobe.  There is also a spiculated 3.1 x 3.1 x 2.4 cm mass at the right upper lobe, with significant architectural  distortion.  Innumerable smaller metastases are noted throughout both lungs.  No pleural effusion is identified.  There is mild underlying emphysema, noted particularly at the right upper lobe.  No pneumothorax is seen.  Scattered coronary artery calcifications are noted.  The patient's left upper lobe mass extends into the mediastinum, with irregular lobulations.  It remains a few millimeters from both the trachea and the descending thoracic aorta, though slightly closer to the course of the left subclavian artery.  Scattered metastases are noted within the mediastinum, demonstrating centrally decreased attenuation.  These are noted at the right paratracheal and periaortic regions, measuring up to 1.3 cm in short axis.  No pericardial effusion is identified. Scattered calcific atherosclerotic disease is noted along the aortic arch.  The great vessels are grossly unremarkable in appearance.  Minimal hypodensity at the left thyroid lobe is nonspecific in appearance, and likely benign given its size.  An enlarged 1.4 cm left supraclavicular node is seen.  Scattered axillary nodes remain normal in size.  No acute osseous abnormalities are seen.  IMPRESSION:  1.  Two dominant masses within the lungs, at the left upper lobe and right middle lobe.  Smaller spiculated mass at the right upper lobe.  Significant associated architectural distortion; the left upper lobe mass extends into the mediastinum.  Partial occlusion of the bronchus to the left upper lobe, and significant mass effect on the left main pulmonary artery. 2.  Innumerable small metastases noted throughout both lungs. 3.  Mild underlying emphysema, particularly at the right upper lobe. 4.  Scattered coronary artery calcifications seen. 5.  Scattered metastases within the mediastinum, at the right paratracheal and periaortic regions, measuring up to 1.3 cm in short axis. 6.  1.4 cm centrally hypodense left supraclavicular node, concerning for metastatic  disease. 7.  Minimal hypodensity at the left thyroid lobe is nonspecific, and likely benign given its size.  CT ABDOMEN AND PELVIS  Findings:  A few hypodensities within the liver are nonspecific but likely reflect small cysts.  The liver  is otherwise unremarkable in appearance.  Two vague hypodense lesions are noted within the spleen, measuring 2.0 and 1.5 cm, concerning for metastases.  There is a large complex heterogeneous left adrenal mass, measuring 4.6 cm in size, concerning for metastatic disease.  The right adrenal gland is unremarkable in appearance.  A large stone is noted within the gallbladder; the gallbladder is otherwise grossly unremarkable.  The pancreas is grossly unremarkable.  A nonobstructing 7 mm stone is noted at the interpole region of the left kidney.  Scattered bilateral renal cysts are seen, measuring up to 1.7 cm in size. There is no evidence of hydronephrosis.  No obstructing ureteral stones are seen.  No perinephric stranding is appreciated.  No free fluid is identified.  The small bowel is unremarkable in appearance.  The stomach is within normal limits.  No acute vascular abnormalities are seen.  Scattered calcification is noted along the abdominal aorta and its branches.  The appendix is normal in caliber, without evidence for appendicitis.  The colon is unremarkable in appearance.  The bladder is mildly distended and grossly unremarkable in appearance.  The patient is status post hysterectomy.  No suspicious adnexal masses are seen; the left ovary is unremarkable in appearance.  No inguinal lymphadenopathy is seen.  No acute osseous abnormalities are identified.  There is nonspecific sclerosis at the pubic symphysis, and vacuum phenomenon and mild sclerotic change are noted at the sacroiliac joints bilaterally.  Degenerative change is noted along the lower lumbar spine.  IMPRESSION:  1.  Large complex left adrenal mass, measuring 4.6 cm, concerning for metastatic disease. 2.  Two  vague hypodensities within the spleen, concerning for metastatic disease. 3.  Small hepatic and renal hypodensities likely reflect cysts. 4.  Cholelithiasis; gallbladder otherwise unremarkable in appearance. 5.  Nonobstructing 7 mm stone at the interpole region of the left kidney. 6.  Scattered calcification along the abdominal aorta and its branches. 7.  No definite evidence for osseous metastatic disease; nonspecific sclerosis at the pubic symphysis, and mild degenerative change along the lower lumbar spine and at the sacroiliac joints.   Original Report Authenticated By: Tonia Ghent, M.D.     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 Further immunohistochemical stains are still pending to identify the primary occlusion of this malignancy. The patient had metastatic disease to the brain, lung as well as left kidney and spleen.   PLAN: I have a lengthy discussion with the patient and her daughter today about her disease stage, prognosis and treatment options. They understand the final pathology is still pending. I recommended for the patient to continue treatment with Decadron for the vasogenic edema. She would also start whole brain irradiation under the care of Dr. Basilio Cairo today. I will arrange for the patient followup appointment with me in 3 weeks for reevaluation and further discussion of her systemic options after completion of the brain irradiation. I also gave the patient the option of palliative care and hospice referral but she declined that and she would like to consider systemic therapy in the future. I gave the patient and her daughter the time to ask questions and I answered them completely to their satisfaction.  All questions were answered. The patient knows to call the clinic with any problems, questions or concerns. We can certainly see the patient much sooner if necessary.  Thank you  so much for allowing me to participate in the  care of Randell Loop. I will continue to follow up the patient with you and assist in her care.  I spent 30 minutes counseling the patient face to face. The total time spent in the appointment was 60 minutes.  Mayli Covington K. 04/19/2012, 10:23 AM

## 2012-04-20 ENCOUNTER — Ambulatory Visit
Admit: 2012-04-20 | Discharge: 2012-04-20 | Disposition: A | Discharge status: Home / Self Care | Payer: Commercial Managed Care - PPO | Attending: Radiation Oncology | Admitting: Radiation Oncology

## 2012-04-20 DIAGNOSIS — C7949 Secondary malignant neoplasm of other parts of nervous system: Secondary | ICD-10-CM

## 2012-04-21 ENCOUNTER — Ambulatory Visit
Admit: 2012-04-21 | Discharge: 2012-04-21 | Disposition: A | Discharge status: Home / Self Care | Payer: Commercial Managed Care - PPO | Attending: Radiation Oncology | Admitting: Radiation Oncology

## 2012-04-21 NOTE — Progress Notes (Signed)
Simulation Verification Note - outpatient - whole brain Date of Service 04-20-12  The patient was brought to the treatment unit and placed in the planned treatment position. The clinical setup was verified. Then port films were obtained and uploaded to the radiation oncology medical record software.  The treatment beams were carefully compared against the planned radiation fields. The position location and shape of the radiation fields was reviewed. They targeted volume of tissue appears to be appropriately covered by the radiation beams. Organs at risk appear to be excluded as planned.  Based on my personal review, I approved the simulation verification mages. The patient's treatment will proceed as planned.  -----------------------------------  Lonie Peak, MD

## 2012-04-24 ENCOUNTER — Ambulatory Visit
Admission: RE | Admit: 2012-04-24 | Discharge: 2012-04-24 | Disposition: A | Discharge status: Home / Self Care | Payer: Commercial Managed Care - PPO | Source: Ambulatory Visit | Attending: Radiation Oncology | Admitting: Radiation Oncology

## 2012-04-24 ENCOUNTER — Encounter: Payer: Self-pay | Admitting: Radiation Oncology

## 2012-04-24 ENCOUNTER — Telehealth: Payer: Self-pay | Admitting: Internal Medicine

## 2012-04-24 VITALS — BP 127/74 | HR 56 | Temp 97.8°F | Resp 20 | Wt 130.2 lb

## 2012-04-24 DIAGNOSIS — C7949 Secondary malignant neoplasm of other parts of nervous system: Secondary | ICD-10-CM

## 2012-04-24 DIAGNOSIS — C7931 Secondary malignant neoplasm of brain: Secondary | ICD-10-CM

## 2012-04-24 MED ORDER — DEXAMETHASONE 4 MG PO TABS: ORAL_TABLET | ORAL | Status: DC

## 2012-04-24 NOTE — Telephone Encounter (Signed)
PT SCHEDULED TO SEE DR. MOHAMED 04/28 @ 1:30.  REFERRING DR. Basilio Cairo

## 2012-04-24 NOTE — Progress Notes (Signed)
Completed post sim ed w/pt; gave her "Radiation and You" booklet w/all pertinent information marked and discussed. Discussed fatigue, hair loss/care, nausea/vomiting, skin irritation/care, nutrition, pain. Pt's daughter came in as education completed, reviewed booklet w/pages marked for her to read. 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

## 2012-04-24 NOTE — Progress Notes (Signed)
   Weekly Management Note: Outpatient Current Dose:  9 Gy  Projected Dose: 30 Gy   Narrative:  The patient presents for routine under treatment assessment.  CBCT/MVCT images/Port film x-rays were reviewed.  The chart was checked. No recent HA's.  Feels "slow." Left shoulder pain.   Physical Findings:  weight is 130 lb 3.2 oz (59.058 kg). Her oral temperature is 97.8 F (36.6 C). Her blood pressure is 127/74 and her pulse is 56. Her respiration is 20.  No thrush.  Impression:  The patient is tolerating radiotherapy.  Plan:  Continue radiotherapy as planned. Taper to Decadron TID with meals. Naprosyn for shoulder.  No obvious mets in shoulder on CT.  ________________________________   Lonie Peak, M.D.

## 2012-04-24 NOTE — Progress Notes (Signed)
Pt states she has not had HA x 2-3 days; she is c/o pain in her left shoulder 6/10, taking Hydrocodone, Ibuprofen prn w/fair to good relief. She states she was nauseated earlier today, but when she took off her sweater it went away, felt she got too hot. Pt denies fatigue, dizziness, unsteadiness states appetite improving, c/o blurry vision. She states she "is slower". Taking Decadron 4 mg q 6 hrs. Post sim completed w/pt, documented under pt education appt.

## 2012-04-25 ENCOUNTER — Ambulatory Visit (INDEPENDENT_AMBULATORY_CARE_PROVIDER_SITE_OTHER): Payer: Commercial Managed Care - PPO | Admitting: Family Medicine

## 2012-04-25 ENCOUNTER — Encounter: Payer: Self-pay | Admitting: Family Medicine

## 2012-04-25 ENCOUNTER — Ambulatory Visit
Admit: 2012-04-25 | Discharge: 2012-04-25 | Disposition: A | Discharge status: Home / Self Care | Payer: Commercial Managed Care - PPO | Attending: Radiation Oncology | Admitting: Radiation Oncology

## 2012-04-25 VITALS — BP 140/76 | HR 71 | Temp 97.8°F | Ht 65.25 in | Wt 127.0 lb

## 2012-04-25 DIAGNOSIS — C799 Secondary malignant neoplasm of unspecified site: Secondary | ICD-10-CM

## 2012-04-25 DIAGNOSIS — G893 Neoplasm related pain (acute) (chronic): Secondary | ICD-10-CM

## 2012-04-25 DIAGNOSIS — C801 Malignant (primary) neoplasm, unspecified: Secondary | ICD-10-CM

## 2012-04-25 MED ORDER — HYDROMORPHONE HCL 2 MG PO TABS: 2.0000 mg | ORAL_TABLET | ORAL | Status: DC | PRN

## 2012-04-25 NOTE — Patient Instructions (Addendum)
-  please do not use the dilaudid with the norco and do not take more then prescribed - please follow up with oncologist for all further pain management

## 2012-04-25 NOTE — Progress Notes (Signed)
Chief Complaint  Patient presents with  . Establish Care    HPI:  Camie Hauss is here to establish care. Per review of chart she did not see any doctors for many years then presented to the ED last week with acute delirium chest pain. 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 [facility] deamed her not a candidate for surgery given the widespread nature of her disease. A lymph node was biopsied. She was discharged on dexamethasone and pain medications. She currently is undergoing radiotherapy (3/10 daily treatments) at [facility] and is on a decadron taper per review of notes and Naprosyn. She has follow up with medical oncology later this month. She had no other known medical problems.  Currently reports: fatigued but doing ok, some ongoing pain in L chest taking Norco for this but it is not touching the pain. Reports they told rad onc nurse and told to talk ibuprofen - this did not touch the pain either. She does not want to complain. Tapering down on the decadron. Denies: CP, SOB, confusion, vomiting, anorexia, fevers, cough, HA  Patient Active Problem List  Diagnosis  . Chest pain  . Tobacco abuse  . Acute delirium  . Elevated blood pressure  . Metastasis of unknown primary  . Putative Brain Metastases    ROS: See pertinent positives and negatives per HPI.  Past Medical History  Diagnosis Date  . Cancer     Family History  Problem Relation Age of Onset  . Heart attack Father   . Kidney cancer Father     History   Social History  . Marital Status: Widowed    Spouse Name: N/A    Number of Children: N/A  . Years of Education: N/A   Social History Main Topics  . Smoking status: Current Every Day Smoker -- 2.00 packs/day for 30 years    Types: Cigarettes  . Smokeless tobacco: None  . Alcohol Use: None  . Drug Use: None  . Sexually Active: None   Other Topics Concern  . None   Social History Narrative  . None    Current  outpatient prescriptions:dexamethasone (DECADRON) 4 MG tablet, Take 1 tab PO TID with meals, Disp: 90 tablet, Rfl: 1;  HYDROcodone-acetaminophen (NORCO/VICODIN) 5-325 MG per tablet, Take 1-2 tablets by mouth every 4 (four) hours as needed., Disp: 30 tablet, Rfl: 0;  ibuprofen (ADVIL,MOTRIN) 200 MG tablet, Take 200 mg by mouth every 6 (six) hours as needed for pain. For pain, Disp: , Rfl:  HYDROmorphone (DILAUDID) 2 MG tablet, Take 1 tablet (2 mg total) by mouth every 4 (four) hours as needed for pain., Disp: 45 tablet, Rfl: 0  EXAM:  Filed Vitals:   04/25/12 0940  BP: 140/76  Pulse: 71  Temp: 97.8 F (36.6 C)    Body mass index is 20.98 kg/(m^2).  GENERAL: vitals reviewed and listed above, alert, oriented, appears well hydrated and in no acute distress  HEENT: atraumatic, conjunttiva clear, no obvious abnormalities on inspection of external nose and ears  NECK: no obvious masses on inspection  MS: moves all extremities without noticeable abnormality  PSYCH: pleasant and cooperative, no obvious depression or anxiety  ASSESSMENT AND PLAN:  Discussed the following assessment and plan:  Metastasis of unknown primary  Neoplasm related pain (acute) (chronic) - Plan: HYDROmorphone (DILAUDID) 2 MG tablet -We reviewed the PMH, PSH, FH, SH, Meds and Allergies. -will defer to oncology and rad oncology for management of her extensive metastatic disease -  adivsed if ongoing pain issues for her to notify her cancer doctor -uncontrolled pain - she reports dilaudid helped the pain in the hospital - I will give her enough dilaudid to make it to her appointment with oncology, discussed risks - is not to use with the norco and daughter will manage her medications. Have advised will defer to oncology or hospice for further management on her pain. -discussed understanding of prognosis and she reports she was told 2 months to 1-2 years -they are considering hospice and we discussed this option at length  - information provided -they will follow up with me as needed   -Patient advised to return or notify a doctor immediately if symptoms worsen or persist or new concerns arise.  Patient Instructions  -please do not use the dilaudid with the norco and do not take more then prescribed - please follow up with oncologist for all further pain management     Keajah Killough R.

## 2012-04-26 ENCOUNTER — Ambulatory Visit
Admit: 2012-04-26 | Discharge: 2012-04-26 | Disposition: A | Discharge status: Home / Self Care | Payer: Commercial Managed Care - PPO | Attending: Radiation Oncology | Admitting: Radiation Oncology

## 2012-04-27 ENCOUNTER — Ambulatory Visit
Admit: 2012-04-27 | Discharge: 2012-04-27 | Disposition: A | Discharge status: Home / Self Care | Payer: Commercial Managed Care - PPO | Attending: Radiation Oncology | Admitting: Radiation Oncology

## 2012-04-28 ENCOUNTER — Ambulatory Visit
Admit: 2012-04-28 | Discharge: 2012-04-28 | Disposition: A | Discharge status: Home / Self Care | Payer: Commercial Managed Care - PPO | Attending: Radiation Oncology | Admitting: Radiation Oncology

## 2012-05-01 ENCOUNTER — Ambulatory Visit
Admission: RE | Admit: 2012-05-01 | Discharge: 2012-05-01 | Disposition: A | Discharge status: Home / Self Care | Payer: Commercial Managed Care - PPO | Source: Ambulatory Visit | Attending: Radiation Oncology | Admitting: Radiation Oncology

## 2012-05-01 ENCOUNTER — Ambulatory Visit
Admit: 2012-05-01 | Discharge: 2012-05-01 | Disposition: A | Discharge status: Home / Self Care | Payer: Commercial Managed Care - PPO | Attending: Radiation Oncology | Admitting: Radiation Oncology

## 2012-05-01 ENCOUNTER — Encounter: Payer: Self-pay | Admitting: Radiation Oncology

## 2012-05-01 VITALS — BP 111/63 | HR 76 | Temp 98.3°F | Wt 129.9 lb

## 2012-05-01 DIAGNOSIS — C7931 Secondary malignant neoplasm of brain: Secondary | ICD-10-CM

## 2012-05-01 NOTE — Patient Instructions (Addendum)
Decadron Taper. Take 1 tablet (4mg ) 3 times day with food until April 28th,  then 1 tablet with breakfast and lunch until May 12th,  then 1 tablet at breakfast only until May 26th,  then 1/2 tablet at breakfast only until June 9th.  Then, stop.  Call if increased severity of Headaches, dizziness, major changes in vision.

## 2012-05-01 NOTE — Progress Notes (Signed)
Ms. Mcloughlin has received 8 fractions to her whole brain.  She states that she has more blurred vision, ataxia, "a little",  and occasional headaches.  Currently has pain in her right upper chest region and she grades this pain as a level 6 on a scale of 0-10.  Encouraged to take her Hydrocodone on a regular basis instead of waiting until her pain level has escalated.  Currently on Decadron 4 mg po TID.  Her mouth is clear and she denies any sore throat or difficulty swallowing.

## 2012-05-02 ENCOUNTER — Ambulatory Visit
Admit: 2012-05-02 | Discharge: 2012-05-02 | Disposition: A | Discharge status: Home / Self Care | Payer: Commercial Managed Care - PPO | Attending: Radiation Oncology | Admitting: Radiation Oncology

## 2012-05-02 NOTE — Progress Notes (Signed)
   Weekly Management Note:  outpatient Current Dose:  24 Gy  Projected Dose: 30 Gy   Narrative:  The patient presents for routine under treatment assessment.  CBCT/MVCT images/Port film x-rays were reviewed.  The chart was checked. Occasional HA. Vision a little blurry but intact. A little ataxia.   Physical Findings:  weight is 129 lb 14.4 oz (58.922 kg). Her temperature is 98.3 F (36.8 C). Her blood pressure is 111/63 and her pulse is 76.  No thrush. Vision grossly intact.  Conversant, alert. No alopecia thus far.  Impression:  The patient is tolerating radiotherapy.  Plan:  Continue radiotherapy as planned. F/u in 1 mo. Med/onc appt next week.  Decadron Taper. Take 1 tablet (4mg ) 3 times day with food until April 28th,  then 1 tablet with breakfast and lunch until May 12th,  then 1 tablet at breakfast only until May 26th,  then 1/2 tablet at breakfast only until June 9th.  Then, stop.  Call if increased severity of Headaches, dizziness, major changes in vision.  ________________________________   Lonie Peak, M.D.

## 2012-05-03 ENCOUNTER — Encounter: Payer: Self-pay | Admitting: Radiation Oncology

## 2012-05-03 ENCOUNTER — Ambulatory Visit
Admission: RE | Admit: 2012-05-03 | Discharge: 2012-05-03 | Disposition: A | Discharge status: Home / Self Care | Payer: Commercial Managed Care - PPO | Source: Ambulatory Visit | Attending: Radiation Oncology | Admitting: Radiation Oncology

## 2012-05-05 ENCOUNTER — Other Ambulatory Visit: Payer: Self-pay | Admitting: Medical Oncology

## 2012-05-05 DIAGNOSIS — C3490 Malignant neoplasm of unspecified part of unspecified bronchus or lung: Secondary | ICD-10-CM

## 2012-05-07 NOTE — Progress Notes (Signed)
  Radiation Oncology         [phone]) [phone] ________________________________  Name: Isabella Carpenter MRN: [id number]  Date: 05/03/2012  DOB: Oct 27, 1945  End of Treatment Note  Diagnosis:   Brain Metastases- Metastatic non-small cell lung cancer, adenocarcinoma  Indication for treatment:  palliative       Radiation treatment dates:   04/20/2012-05/03/2012  Site/dose:   Whole Brain / 30Gy/ 10 fractions  Beams/energy:   Obliques / photons  Narrative: The patient tolerated radiation treatment relatively well.     Plan: The patient has completed radiation treatment. The patient will return to radiation oncology clinic for routine followup in one month. I advised them to call or return sooner if they have any questions or concerns related to their recovery or treatment. Decadron Taper.  Take 1 tablet (4mg ) 3 times day with food until April 28th,  then 1 tablet with breakfast and lunch until May 12th,  then 1 tablet at breakfast only until May 26th,  then 1/2 tablet at breakfast only until June 9th. Then, stop. Call if increased severity of Headaches, dizziness, major changes in vision   -----------------------------------  Lonie Peak, MD

## 2012-05-08 ENCOUNTER — Encounter: Payer: Self-pay | Admitting: Internal Medicine

## 2012-05-08 ENCOUNTER — Telehealth: Payer: Self-pay | Admitting: Internal Medicine

## 2012-05-08 ENCOUNTER — Ambulatory Visit: Payer: Commercial Managed Care - PPO

## 2012-05-08 ENCOUNTER — Ambulatory Visit (HOSPITAL_BASED_OUTPATIENT_CLINIC_OR_DEPARTMENT_OTHER): Payer: Commercial Managed Care - PPO

## 2012-05-08 ENCOUNTER — Other Ambulatory Visit (HOSPITAL_BASED_OUTPATIENT_CLINIC_OR_DEPARTMENT_OTHER): Payer: Commercial Managed Care - PPO | Admitting: Lab

## 2012-05-08 ENCOUNTER — Ambulatory Visit (HOSPITAL_BASED_OUTPATIENT_CLINIC_OR_DEPARTMENT_OTHER): Payer: Commercial Managed Care - PPO | Admitting: Internal Medicine

## 2012-05-08 DIAGNOSIS — C7949 Secondary malignant neoplasm of other parts of nervous system: Secondary | ICD-10-CM

## 2012-05-08 DIAGNOSIS — C3490 Malignant neoplasm of unspecified part of unspecified bronchus or lung: Secondary | ICD-10-CM

## 2012-05-08 DIAGNOSIS — C3430 Malignant neoplasm of lower lobe, unspecified bronchus or lung: Secondary | ICD-10-CM

## 2012-05-08 DIAGNOSIS — R5383 Other fatigue: Secondary | ICD-10-CM

## 2012-05-08 DIAGNOSIS — C3492 Malignant neoplasm of unspecified part of left bronchus or lung: Secondary | ICD-10-CM | POA: Insufficient documentation

## 2012-05-08 HISTORY — DX: Malignant neoplasm of unspecified part of unspecified bronchus or lung: C34.90

## 2012-05-08 LAB — COMPREHENSIVE METABOLIC PANEL (CC13)
Albumin: 3.1 g/dL — ABNORMAL LOW (ref 3.5–5.0)
Alkaline Phosphatase: 63 U/L (ref 40–150)
CO2: 26 mEq/L (ref 22–29)
Chloride: 101 mEq/L (ref 98–107)
Glucose: 117 mg/dl — ABNORMAL HIGH (ref 70–99)
Potassium: 4.9 mEq/L (ref 3.5–5.1)
Sodium: 135 mEq/L — ABNORMAL LOW (ref 136–145)
Total Protein: 6.1 g/dL — ABNORMAL LOW (ref 6.4–8.3)

## 2012-05-08 LAB — CBC WITH DIFFERENTIAL/PLATELET
Basophils Absolute: 0 10*3/uL (ref 0.0–0.1)
EOS%: 0.1 % (ref 0.0–7.0)
Eosinophils Absolute: 0 10*3/uL (ref 0.0–0.5)
HGB: 14.9 g/dL (ref 11.6–15.9)
MONO#: 0.8 10*3/uL (ref 0.1–0.9)
NEUT#: 14.9 10*3/uL — ABNORMAL HIGH (ref 1.5–6.5)
RDW: 15.7 % — ABNORMAL HIGH (ref 11.2–14.5)
lymph#: 0.6 10*3/uL — ABNORMAL LOW (ref 0.9–3.3)

## 2012-05-08 MED ORDER — PROCHLORPERAZINE MALEATE 10 MG PO TABS: 10.0000 mg | ORAL_TABLET | Freq: Four times a day (QID) | ORAL | Status: DC | PRN

## 2012-05-08 MED ORDER — CYANOCOBALAMIN 1000 MCG/ML IJ SOLN
1000.0000 ug | Freq: Once | INTRAMUSCULAR | Status: AC
  Administered 2012-05-08: 1000 ug via INTRAMUSCULAR

## 2012-05-08 MED ORDER — FOLIC ACID 1 MG PO TABS: 1.0000 mg | ORAL_TABLET | Freq: Every day | ORAL | Status: AC

## 2012-05-08 NOTE — Progress Notes (Signed)
Checked in new patient. No financial issues. °

## 2012-05-08 NOTE — Progress Notes (Signed)
[facility] [phone]   [phone]  OFFICE PROGRESS NOTE  [name], DO [address] [place] [address]  DIAGNOSIS: Metastatic non-small cell lung cancer, adenocarcinoma diagnosed in April 2014. EGFR mutation as well as ALK gene translocation result is still pending.  PRIOR THERAPY: Status post whole brain irradiation under the care of Dr. Selina Cooley completed on 05/03/2012  CURRENT THERAPY: The patient is expected to start the first cycle of systemic chemotherapy was carboplatin for AUC of 5 and Alimta 500 mg/M2 every 3 weeks on 05/18/2012  INTERVAL HISTORY: Isabella Carpenter 67 y.o. female returns to the clinic today for followup visit accompanied by her daughter. The patient is feeling fine today except for fatigue and weakness after the brain irradiation. She denied having any significant chest pain, shortness of breath, cough or hemoptysis. The patient denied having any significant night sweats but she lists few pounds since her last visit. She is here today for evaluation and discussion of her systemic treatment options.  MEDICAL HISTORY: Past Medical History  Diagnosis Date  . Cancer   . Chicken pox   . GERD (gastroesophageal reflux disease)   . Hypertension   . High cholesterol   . Urine incontinence   . Kidney stone   . Lung cancer 05/08/2012    ALLERGIES:  has No Known Allergies.  MEDICATIONS:  Current Outpatient Prescriptions  Medication Sig Dispense Refill  . dexamethasone (DECADRON) 4 MG tablet Take 1 tab PO TID with meals  90 tablet  1  . HYDROcodone-acetaminophen (NORCO/VICODIN) 5-325 MG per tablet Take 1 tablet by mouth every 4 (four) hours as needed for pain. Refill:  Take 1-2 tablets every 4 hours as needed for pain. Total 60 tabs 04/26/12      . ibuprofen (ADVIL,MOTRIN) 200 MG tablet Take 200 mg by mouth every 6 (six) hours as needed for pain. For pain      . folic acid (FOLVITE) 1 MG tablet Take 1 tablet (1 mg  total) by mouth daily.  30 tablet  3  . prochlorperazine (COMPAZINE) 10 MG tablet Take 1 tablet (10 mg total) by mouth every 6 (six) hours as needed.  60 tablet  0  . promethazine (PHENERGAN) 25 MG tablet Take 25 mg by mouth. Phenergan  25 mg tablets: Take 1 q 4 - 6 hours prn nausea.  Total 30 Tablets with 1 refill       No current facility-administered medications for this visit.    SURGICAL HISTORY:  Past Surgical History  Procedure Laterality Date  . Abdominal hysterectomy    . Axillary lymph node biopsy Left 04/18/2012    Procedure: AXILLARY LYMPH NODE BIOPSY;  Surgeon: Adolph Pollack, MD;  Location: [facility];  Service: General;  Laterality: Left;  left subclavian. need one hour    REVIEW OF SYSTEMS:  A comprehensive review of systems was negative except for: Constitutional: positive for anorexia, fatigue and weight loss Respiratory: positive for dyspnea on exertion   PHYSICAL EXAMINATION: General appearance: alert, cooperative, fatigued and no distress Head: Normocephalic, without obvious abnormality, atraumatic Neck: no adenopathy Lymph nodes: Cervical, supraclavicular, and axillary nodes normal. Resp: clear to auscultation bilaterally Cardio: regular rate and rhythm, S1, S2 normal, no murmur, click, rub or gallop GI: soft, non-tender; bowel sounds normal; no masses,  no organomegaly Extremities: extremities normal, atraumatic, no cyanosis or edema Neurologic: Alert and oriented X 3, normal strength and tone. Normal symmetric reflexes. Normal coordination and gait  ECOG PERFORMANCE STATUS: 1 - Symptomatic but completely ambulatory  Blood pressure 151/83, pulse 61, temperature 97.2 F (36.2 C), temperature source Oral, resp. rate 18, height 5' 5.25" (1.657 m), weight 133 lb 9.6 oz (60.601 kg).  LABORATORY DATA: Lab Results  Component Value Date   WBC 16.3* 05/08/2012   HGB 14.9 05/08/2012   HCT 43.9 05/08/2012   MCV 87.3 05/08/2012   PLT 329 05/08/2012      Chemistry        Component Value Date/Time   NA 135* 05/08/2012 1332   NA 137 04/17/2012 0423   K 4.9 05/08/2012 1332   K 3.9 04/17/2012 0423   CL 101 05/08/2012 1332   CL 102 04/17/2012 0423   CO2 26 05/08/2012 1332   CO2 27 04/17/2012 0423   BUN 25.1 05/08/2012 1332   BUN 6 04/17/2012 0423   CREATININE 0.9 05/08/2012 1332   CREATININE 0.62 04/17/2012 0423      Component Value Date/Time   CALCIUM 9.0 05/08/2012 1332   CALCIUM 9.1 04/17/2012 0423   ALKPHOS 63 05/08/2012 1332   AST 9 05/08/2012 1332   ALT 26 05/08/2012 1332   BILITOT 0.46 05/08/2012 1332       RADIOGRAPHIC STUDIES: Dg Chest 2 View  04/16/2012  *RADIOLOGY REPORT*  Clinical Data: Chest pain.  Neck pain.  Tobacco use.  CHEST - 2 VIEW  Comparison: None.  Findings: Scattered pulmonary nodules and pulmonary masses are observed bilaterally.  An index mass in the right to middle lobe measures 5.4 x 3.2 by 5.2 cm.  Mass-like scarring noted along the left lung apex.  No cardiomegaly noted, but the aortic arch seems to be right-sided.  Unusual lucency at the T12-L1 level on the frontal projection may to be due to bowel gas although a vertebral anomaly cannot be totally excluded.  No pleural effusion is identified.  IMPRESSION:  1.  Scattered nodules and masses throughout both lungs.  Appearance favors metastatic malignancy to both lungs.  Fungal pneumonia and rheumatoid nodules are less likely differential diagnostic considerations.  CT chest with contrast is recommended for further workup, and considering the bilaterality of disease, imaging of the abdomen and pelvis may be prudent as well. 2.  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 3.  Lucency at the thoracolumbar junction on the frontal projection is somewhat unusual but probably simply due to bowel gas.  This could be further characterized on the CT exam to ensure that does not represent a vertebral anomaly.   Original Report Authenticated By: Gaylyn Rong, M.D.    Ct Head W Wo  Contrast  04/16/2012  *RADIOLOGY REPORT*  Clinical Data: Forgetful; known lung mass.  Assess for brain metastases.  CT HEAD WITHOUT AND WITH CONTRAST  Technique:  Contiguous axial images were obtained from the base of the skull through the vertex without and with intravenous contrast.  Contrast: OMNIPAQUE IOHEXOL 300 MG/ML  SOLN  Comparison: None.  Findings: Evaluation is suboptimal due to motion artifact. However, multiple prominent areas of decreased attenuation are seen throughout the cerebral hemispheres and at the left side of the cerebellum, compatible with multiple metastases.  Identifiable masses measure perhaps 2.0 cm at the high left frontal lobe, 2.5 cm at the expected location of the frontal horn of the left lateral ventricle, 1.5 cm at the left occipital lobe, 1.5 cm at the left cerebellar hemisphere and 1.7 cm at the posterior right parietal lobe.  There is perhaps 7 mm of rightward midline shift, though  this is difficult to fully characterize.  There is no evidence of acute infarction, or intra- or extra-axial hemorrhage on CT.  There is no evidence of fracture; visualized osseous structures are unremarkable in appearance.  The visualized portions of the orbits are within normal limits.  The paranasal sinuses and mastoid air cells are well-aerated.  No significant soft tissue abnormalities are seen.  IMPRESSION:  1.  Multiple cerebral and cerebellar metastases noted, with extensive areas of decreased attenuation, effacement of the frontal horn of the left lateral ventricle and perhaps 7 mm of rightward midline shift. 2.  No definite evidence of intracranial hemorrhage.  These results were called by telephone on 04/16/2012 at 09:21 p.m. to Earley Favor PA, who verbally acknowledged these results.   Original Report Authenticated By: Tonia Ghent, M.D.    Ct Chest W Contrast  04/16/2012  *RADIOLOGY REPORT*  Clinical Data:  Assess for chest and abdominal metastatic disease; known lung masses.  CT  CHEST, ABDOMEN AND PELVIS WITH CONTRAST  Technique:  Multidetector CT imaging of the chest, abdomen and pelvis was performed following the standard protocol during bolus administration of intravenous contrast.  Contrast: OMNIPAQUE IOHEXOL 300 MG/ML  SOLN  Comparison:   None.  CT CHEST  Findings:  There are two dominant masses within the lungs, with a third somewhat smaller mass also seen.  The largest mass is noted at the left upper lobe, with significant architectural distortion; it measures approximately 8.6 x 4.4 x 6.5 cm, with partial occlusion of the bronchioles to the left upper lobe, and marked mass effect on the left main pulmonary artery.  A large 6.2 x 3.7 x 3.9 cm mass is noted within the right middle lobe.  There is also a spiculated 3.1 x 3.1 x 2.4 cm mass at the right upper lobe, with significant architectural distortion.  Innumerable smaller metastases are noted throughout both lungs.  No pleural effusion is identified.  There is mild underlying emphysema, noted particularly at the right upper lobe.  No pneumothorax is seen.  Scattered coronary artery calcifications are noted.  The patient's left upper lobe mass extends into the mediastinum, with irregular lobulations.  It remains a few millimeters from both the trachea and the descending thoracic aorta, though slightly closer to the course of the left subclavian artery.  Scattered metastases are noted within the mediastinum, demonstrating centrally decreased attenuation.  These are noted at the right paratracheal and periaortic regions, measuring up to 1.3 cm in short axis.  No pericardial effusion is identified. Scattered calcific atherosclerotic disease is noted along the aortic arch.  The great vessels are grossly unremarkable in appearance.  Minimal hypodensity at the left thyroid lobe is nonspecific in appearance, and likely benign given its size.  An enlarged 1.4 cm left supraclavicular node is seen.  Scattered axillary nodes remain  normal in size.  No acute osseous abnormalities are seen.  IMPRESSION:  1.  Two dominant masses within the lungs, at the left upper lobe and right middle lobe.  Smaller spiculated mass at the right upper lobe.  Significant associated architectural distortion; the left upper lobe mass extends into the mediastinum.  Partial occlusion of the bronchus to the left upper lobe, and significant mass effect on the left main pulmonary artery. 2.  Innumerable small metastases noted throughout both lungs. 3.  Mild underlying emphysema, particularly at the right upper lobe. 4.  Scattered coronary artery calcifications seen. 5.  Scattered metastases within the mediastinum, at the right paratracheal and periaortic  regions, measuring up to 1.3 cm in short axis. 6.  1.4 cm centrally hypodense left supraclavicular node, concerning for metastatic disease. 7.  Minimal hypodensity at the left thyroid lobe is nonspecific, and likely benign given its size.  CT ABDOMEN AND PELVIS  Findings:  A few hypodensities within the liver are nonspecific but likely reflect small cysts.  The liver is otherwise unremarkable in appearance.  Two vague hypodense lesions are noted within the spleen, measuring 2.0 and 1.5 cm, concerning for metastases.  There is a large complex heterogeneous left adrenal mass, measuring 4.6 cm in size, concerning for metastatic disease.  The right adrenal gland is unremarkable in appearance.  A large stone is noted within the gallbladder; the gallbladder is otherwise grossly unremarkable.  The pancreas is grossly unremarkable.  A nonobstructing 7 mm stone is noted at the interpole region of the left kidney.  Scattered bilateral renal cysts are seen, measuring up to 1.7 cm in size. There is no evidence of hydronephrosis.  No obstructing ureteral stones are seen.  No perinephric stranding is appreciated.  No free fluid is identified.  The small bowel is unremarkable in appearance.  The stomach is within normal limits.  No  acute vascular abnormalities are seen.  Scattered calcification is noted along the abdominal aorta and its branches.  The appendix is normal in caliber, without evidence for appendicitis.  The colon is unremarkable in appearance.  The bladder is mildly distended and grossly unremarkable in appearance.  The patient is status post hysterectomy.  No suspicious adnexal masses are seen; the left ovary is unremarkable in appearance.  No inguinal lymphadenopathy is seen.  No acute osseous abnormalities are identified.  There is nonspecific sclerosis at the pubic symphysis, and vacuum phenomenon and mild sclerotic change are noted at the sacroiliac joints bilaterally.  Degenerative change is noted along the lower lumbar spine.  IMPRESSION:  1.  Large complex left adrenal mass, measuring 4.6 cm, concerning for metastatic disease. 2.  Two vague hypodensities within the spleen, concerning for metastatic disease. 3.  Small hepatic and renal hypodensities likely reflect cysts. 4.  Cholelithiasis; gallbladder otherwise unremarkable in appearance. 5.  Nonobstructing 7 mm stone at the interpole region of the left kidney. 6.  Scattered calcification along the abdominal aorta and its branches. 7.  No definite evidence for osseous metastatic disease; nonspecific sclerosis at the pubic symphysis, and mild degenerative change along the lower lumbar spine and at the sacroiliac joints.   Original Report Authenticated By: Tonia Ghent, M.D.    Ct Abdomen Pelvis W Contrast  04/16/2012  *RADIOLOGY REPORT*  Clinical Data:  Assess for chest and abdominal metastatic disease; known lung masses.  CT CHEST, ABDOMEN AND PELVIS WITH CONTRAST  Technique:  Multidetector CT imaging of the chest, abdomen and pelvis was performed following the standard protocol during bolus administration of intravenous contrast.  Contrast: OMNIPAQUE IOHEXOL 300 MG/ML  SOLN  Comparison:   None.  CT CHEST  Findings:  There are two dominant masses within the  lungs, with a third somewhat smaller mass also seen.  The largest mass is noted at the left upper lobe, with significant architectural distortion; it measures approximately 8.6 x 4.4 x 6.5 cm, with partial occlusion of the bronchioles to the left upper lobe, and marked mass effect on the left main pulmonary artery.  A large 6.2 x 3.7 x 3.9 cm mass is noted within the right middle lobe.  There is also a spiculated 3.1 x 3.1 x 2.4  cm mass at the right upper lobe, with significant architectural distortion.  Innumerable smaller metastases are noted throughout both lungs.  No pleural effusion is identified.  There is mild underlying emphysema, noted particularly at the right upper lobe.  No pneumothorax is seen.  Scattered coronary artery calcifications are noted.  The patient's left upper lobe mass extends into the mediastinum, with irregular lobulations.  It remains a few millimeters from both the trachea and the descending thoracic aorta, though slightly closer to the course of the left subclavian artery.  Scattered metastases are noted within the mediastinum, demonstrating centrally decreased attenuation.  These are noted at the right paratracheal and periaortic regions, measuring up to 1.3 cm in short axis.  No pericardial effusion is identified. Scattered calcific atherosclerotic disease is noted along the aortic arch.  The great vessels are grossly unremarkable in appearance.  Minimal hypodensity at the left thyroid lobe is nonspecific in appearance, and likely benign given its size.  An enlarged 1.4 cm left supraclavicular node is seen.  Scattered axillary nodes remain normal in size.  No acute osseous abnormalities are seen.  IMPRESSION:  1.  Two dominant masses within the lungs, at the left upper lobe and right middle lobe.  Smaller spiculated mass at the right upper lobe.  Significant associated architectural distortion; the left upper lobe mass extends into the mediastinum.  Partial occlusion of the bronchus  to the left upper lobe, and significant mass effect on the left main pulmonary artery. 2.  Innumerable small metastases noted throughout both lungs. 3.  Mild underlying emphysema, particularly at the right upper lobe. 4.  Scattered coronary artery calcifications seen. 5.  Scattered metastases within the mediastinum, at the right paratracheal and periaortic regions, measuring up to 1.3 cm in short axis. 6.  1.4 cm centrally hypodense left supraclavicular node, concerning for metastatic disease. 7.  Minimal hypodensity at the left thyroid lobe is nonspecific, and likely benign given its size.  CT ABDOMEN AND PELVIS  Findings:  A few hypodensities within the liver are nonspecific but likely reflect small cysts.  The liver is otherwise unremarkable in appearance.  Two vague hypodense lesions are noted within the spleen, measuring 2.0 and 1.5 cm, concerning for metastases.  There is a large complex heterogeneous left adrenal mass, measuring 4.6 cm in size, concerning for metastatic disease.  The right adrenal gland is unremarkable in appearance.  A large stone is noted within the gallbladder; the gallbladder is otherwise grossly unremarkable.  The pancreas is grossly unremarkable.  A nonobstructing 7 mm stone is noted at the interpole region of the left kidney.  Scattered bilateral renal cysts are seen, measuring up to 1.7 cm in size. There is no evidence of hydronephrosis.  No obstructing ureteral stones are seen.  No perinephric stranding is appreciated.  No free fluid is identified.  The small bowel is unremarkable in appearance.  The stomach is within normal limits.  No acute vascular abnormalities are seen.  Scattered calcification is noted along the abdominal aorta and its branches.  The appendix is normal in caliber, without evidence for appendicitis.  The colon is unremarkable in appearance.  The bladder is mildly distended and grossly unremarkable in appearance.  The patient is status post hysterectomy.  No  suspicious adnexal masses are seen; the left ovary is unremarkable in appearance.  No inguinal lymphadenopathy is seen.  No acute osseous abnormalities are identified.  There is nonspecific sclerosis at the pubic symphysis, and vacuum phenomenon and mild sclerotic change are  noted at the sacroiliac joints bilaterally.  Degenerative change is noted along the lower lumbar spine.  IMPRESSION:  1.  Large complex left adrenal mass, measuring 4.6 cm, concerning for metastatic disease. 2.  Two vague hypodensities within the spleen, concerning for metastatic disease. 3.  Small hepatic and renal hypodensities likely reflect cysts. 4.  Cholelithiasis; gallbladder otherwise unremarkable in appearance. 5.  Nonobstructing 7 mm stone at the interpole region of the left kidney. 6.  Scattered calcification along the abdominal aorta and its branches. 7.  No definite evidence for osseous metastatic disease; nonspecific sclerosis at the pubic symphysis, and mild degenerative change along the lower lumbar spine and at the sacroiliac joints.   Original Report Authenticated By: Tonia Ghent, M.D.     ASSESSMENT: This is a very pleasant 67 years old white female with metastatic non-small cell lung cancer, adenocarcinoma with brain metastasis status post whole brain irradiation.  PLAN: I have a lengthy discussion with the patient and her daughter today about her current disease status and treatment options. The molecular study for EGFR mutation as well as ALK gene translocation is still pending. I discussed with the patient treatment with systemic chemotherapy in the form of carboplatin for AUC of 5 and Alimta 500 mg/M2 every 3 weeks. I discussed with the patient adverse effect of this treatment including but to alopecia, myelosuppression, nausea and vomiting, peripheral neuropathy, liver or renal dysfunction. The patient receive vitamin B12 injection today. I will her pharmacy with prescription for Compazine 10 mg by mouth every  6 hours as needed for nausea, folic acid 1 mg by mouth daily and she will at take Decadron 4 mg by mouth twice a day the day before, day of and day after the chemotherapy. She is currently on a tapering dose of Decadron 4 mg by mouth 3 times a day. She is expected to start the first cycle of her chemotherapy next week. She would have a chemotherapy education class before starting the first dose of her chemotherapy. The patient would come back for followup visit in 2 weeks for reevaluation and management any adverse effect of her chemotherapy. The patient was advised to call immediately if she has any concerning symptoms in the interval.  All questions were answered. The patient knows to call the clinic with any problems, questions or concerns. We can certainly see the patient much sooner if necessary.  I spent 30 minutes counseling the patient face to face. The total time spent in the appointment was 45 minutes.

## 2012-05-08 NOTE — Patient Instructions (Signed)
You have a diagnosis of stage IV non-small cell lung cancer, adenocarcinoma. We discussed systemic treatment options with carboplatin and Alimta. First dose next week. Followup visit in 2 weeks.

## 2012-05-08 NOTE — Patient Instructions (Addendum)

## 2012-05-08 NOTE — Addendum Note (Signed)
Addended by: Si Gaul on: 05/08/2012 07:36 PM   Modules accepted: Level of Service

## 2012-05-09 ENCOUNTER — Ambulatory Visit: Payer: Commercial Managed Care - PPO

## 2012-05-11 ENCOUNTER — Other Ambulatory Visit: Payer: Self-pay | Admitting: Internal Medicine

## 2012-05-11 ENCOUNTER — Other Ambulatory Visit: Payer: Self-pay | Admitting: Radiation Oncology

## 2012-05-13 ENCOUNTER — Other Ambulatory Visit: Payer: Self-pay | Admitting: Radiation Oncology

## 2012-05-15 ENCOUNTER — Other Ambulatory Visit: Payer: Commercial Managed Care - PPO

## 2012-05-15 ENCOUNTER — Other Ambulatory Visit: Payer: Self-pay | Admitting: *Deleted

## 2012-05-15 DIAGNOSIS — C799 Secondary malignant neoplasm of unspecified site: Secondary | ICD-10-CM

## 2012-05-15 MED ORDER — MORPHINE SULFATE ER 30 MG PO TBCR: 30.0000 mg | EXTENDED_RELEASE_TABLET | Freq: Two times a day (BID) | ORAL | Status: DC

## 2012-05-15 MED ORDER — OXYCODONE-ACETAMINOPHEN 5-325 MG PO TABS: 1.0000 | ORAL_TABLET | Freq: Four times a day (QID) | ORAL | Status: DC | PRN

## 2012-05-18 ENCOUNTER — Ambulatory Visit (HOSPITAL_BASED_OUTPATIENT_CLINIC_OR_DEPARTMENT_OTHER): Payer: Commercial Managed Care - PPO

## 2012-05-18 ENCOUNTER — Other Ambulatory Visit (HOSPITAL_BASED_OUTPATIENT_CLINIC_OR_DEPARTMENT_OTHER): Payer: Commercial Managed Care - PPO | Admitting: Lab

## 2012-05-18 ENCOUNTER — Encounter: Payer: Self-pay | Admitting: Internal Medicine

## 2012-05-18 DIAGNOSIS — Z9221 Personal history of antineoplastic chemotherapy: Secondary | ICD-10-CM

## 2012-05-18 DIAGNOSIS — C770 Secondary and unspecified malignant neoplasm of lymph nodes of head, face and neck: Secondary | ICD-10-CM

## 2012-05-18 DIAGNOSIS — C3490 Malignant neoplasm of unspecified part of unspecified bronchus or lung: Secondary | ICD-10-CM

## 2012-05-18 DIAGNOSIS — C7931 Secondary malignant neoplasm of brain: Secondary | ICD-10-CM

## 2012-05-18 DIAGNOSIS — Z5111 Encounter for antineoplastic chemotherapy: Secondary | ICD-10-CM

## 2012-05-18 HISTORY — DX: Personal history of antineoplastic chemotherapy: Z92.21

## 2012-05-18 LAB — CBC WITH DIFFERENTIAL/PLATELET
EOS%: 0.8 % (ref 0.0–7.0)
Eosinophils Absolute: 0.1 10*3/uL (ref 0.0–0.5)
MCV: 86.5 fL (ref 79.5–101.0)
MONO%: 6.9 % (ref 0.0–14.0)
NEUT#: 10.4 10*3/uL — ABNORMAL HIGH (ref 1.5–6.5)
RBC: 5.33 10*6/uL (ref 3.70–5.45)
RDW: 16 % — ABNORMAL HIGH (ref 11.2–14.5)
nRBC: 0 % (ref 0–0)

## 2012-05-18 LAB — COMPREHENSIVE METABOLIC PANEL (CC13)
ALT: 37 U/L (ref 0–55)
AST: 17 U/L (ref 5–34)
Alkaline Phosphatase: 58 U/L (ref 40–150)
CO2: 27 mEq/L (ref 22–29)
Sodium: 138 mEq/L (ref 136–145)
Total Bilirubin: 0.55 mg/dL (ref 0.20–1.20)
Total Protein: 6 g/dL — ABNORMAL LOW (ref 6.4–8.3)

## 2012-05-18 MED ORDER — SODIUM CHLORIDE 0.9 % IV SOLN
Freq: Once | INTRAVENOUS | Status: AC
  Administered 2012-05-18: 10:00:00 via INTRAVENOUS

## 2012-05-18 MED ORDER — ONDANSETRON 16 MG/50ML IVPB (CHCC)
16.0000 mg | Freq: Once | INTRAVENOUS | Status: AC
  Administered 2012-05-18: 16 mg via INTRAVENOUS

## 2012-05-18 MED ORDER — SODIUM CHLORIDE 0.9 % IV SOLN
427.0000 mg | Freq: Once | INTRAVENOUS | Status: AC
  Administered 2012-05-18: 430 mg via INTRAVENOUS
  Filled 2012-05-18: qty 43

## 2012-05-18 MED ORDER — DEXAMETHASONE SODIUM PHOSPHATE 20 MG/5ML IJ SOLN
20.0000 mg | Freq: Once | INTRAMUSCULAR | Status: AC
  Administered 2012-05-18: 20 mg via INTRAVENOUS

## 2012-05-18 MED ORDER — PEMETREXED DISODIUM CHEMO INJECTION 500 MG
500.0000 mg/m2 | Freq: Once | INTRAVENOUS | Status: AC
  Administered 2012-05-18: 825 mg via INTRAVENOUS
  Filled 2012-05-18: qty 33

## 2012-05-18 NOTE — Progress Notes (Signed)
Discharged to home,ambulatory with daughter Angelica Chessman.  Steady slow ambulation.  Denies need for w/c.

## 2012-05-18 NOTE — Progress Notes (Signed)
Called and left message for the patient. I need her proof of income and number of folks in household for Alimta assistance with [employer].

## 2012-05-18 NOTE — Patient Instructions (Signed)
[facility] Discharge Instructions for Patients Receiving Chemotherapy  Today you received the following chemotherapy agents Alimta/Carboplatin.  To help prevent nausea and vomiting after your treatment, we encourage you to take your nausea medication Phenergan and Compazine Begin taking it at anytime upon discharge and take it as often as prescribed for the next 72 hours.   If you develop nausea and vomiting that is not controlled by your nausea medication, call the clinic. If it is after clinic hours your family physician or the after hours number for the clinic or go to the Emergency Department.   BELOW ARE SYMPTOMS THAT SHOULD BE REPORTED IMMEDIATELY:  *FEVER GREATER THAN 100.5 F  *CHILLS WITH OR WITHOUT FEVER  NAUSEA AND VOMITING THAT IS NOT CONTROLLED WITH YOUR NAUSEA MEDICATION  *UNUSUAL SHORTNESS OF BREATH  *UNUSUAL BRUISING OR BLEEDING  TENDERNESS IN MOUTH AND THROAT WITH OR WITHOUT PRESENCE OF ULCERS  *URINARY PROBLEMS  *BOWEL PROBLEMS  UNUSUAL RASH Items with * indicate a potential emergency and should be followed up as soon as possible.  One of the nurses will contact you 24 hours after your treatment. Please let the nurse know about any problems that you may have experienced. Feel free to call the clinic you have any questions or concerns. The clinic phone number is [phone].   I have been informed and understand all the instructions given to me. I know to contact the clinic, my physician, or go to the Emergency Department if any problems should occur. I do not have any questions at this time, but understand that I may call the clinic during office hours   should I have any questions or need assistance in obtaining follow up care.    __________________________________________  _____________  __________ Signature of Patient or Authorized Representative            Date                    Time    __________________________________________ Nurse's Signature

## 2012-05-19 ENCOUNTER — Telehealth: Payer: Self-pay | Admitting: *Deleted

## 2012-05-19 NOTE — Telephone Encounter (Signed)
Message copied by Kathlynn Grate on Fri May 19, 2012  4:17 PM ------      Message from: Augusto Garbe      Created: Thu May 18, 2012 12:50 PM      Regarding: Chemotherapy follow up       Dr. Arbutus Ped  1st Alimta, carboplatin ------

## 2012-05-19 NOTE — Telephone Encounter (Signed)
Patient denies any fever/chills/nausea/vomiting/diarrhea. Encouraged patient to continue to hydrate through the weekend and call if she has any problems.

## 2012-05-22 ENCOUNTER — Encounter: Payer: Self-pay | Admitting: Internal Medicine

## 2012-05-22 NOTE — Progress Notes (Signed)
I called and spoke with the patient to advise her to see me for signature and bring her last 2 paystubs for [employer] assistance with Alimta. She will see me before or after her lab.

## 2012-05-25 ENCOUNTER — Inpatient Hospital Stay (HOSPITAL_COMMUNITY): Payer: Commercial Managed Care - PPO

## 2012-05-25 ENCOUNTER — Encounter: Payer: Self-pay | Admitting: Physician Assistant

## 2012-05-25 ENCOUNTER — Inpatient Hospital Stay (HOSPITAL_COMMUNITY)
Admission: AD | Admit: 2012-05-25 | Discharge: 2012-05-30 | DRG: 368 | Disposition: A | Discharge status: Home / Self Care | Payer: Commercial Managed Care - PPO | Source: Ambulatory Visit | Attending: Internal Medicine | Admitting: Internal Medicine

## 2012-05-25 ENCOUNTER — Other Ambulatory Visit (HOSPITAL_BASED_OUTPATIENT_CLINIC_OR_DEPARTMENT_OTHER): Payer: Commercial Managed Care - PPO | Admitting: Lab

## 2012-05-25 ENCOUNTER — Telehealth: Payer: Self-pay | Admitting: Medical Oncology

## 2012-05-25 ENCOUNTER — Encounter (HOSPITAL_COMMUNITY): Payer: Self-pay | Admitting: Family Medicine

## 2012-05-25 ENCOUNTER — Ambulatory Visit: Payer: Commercial Managed Care - PPO

## 2012-05-25 ENCOUNTER — Telehealth: Payer: Self-pay | Admitting: *Deleted

## 2012-05-25 ENCOUNTER — Other Ambulatory Visit: Payer: Self-pay | Admitting: *Deleted

## 2012-05-25 ENCOUNTER — Telehealth: Payer: Self-pay | Admitting: Internal Medicine

## 2012-05-25 ENCOUNTER — Ambulatory Visit (HOSPITAL_BASED_OUTPATIENT_CLINIC_OR_DEPARTMENT_OTHER): Payer: Commercial Managed Care - PPO | Admitting: Physician Assistant

## 2012-05-25 DIAGNOSIS — C7949 Secondary malignant neoplasm of other parts of nervous system: Secondary | ICD-10-CM | POA: Diagnosis present

## 2012-05-25 DIAGNOSIS — B3781 Candidal esophagitis: Principal | ICD-10-CM | POA: Diagnosis present

## 2012-05-25 DIAGNOSIS — C3490 Malignant neoplasm of unspecified part of unspecified bronchus or lung: Secondary | ICD-10-CM

## 2012-05-25 DIAGNOSIS — D630 Anemia in neoplastic disease: Secondary | ICD-10-CM | POA: Diagnosis present

## 2012-05-25 DIAGNOSIS — C7931 Secondary malignant neoplasm of brain: Secondary | ICD-10-CM | POA: Diagnosis present

## 2012-05-25 DIAGNOSIS — D702 Other drug-induced agranulocytosis: Secondary | ICD-10-CM | POA: Diagnosis not present

## 2012-05-25 DIAGNOSIS — B370 Candidal stomatitis: Secondary | ICD-10-CM

## 2012-05-25 DIAGNOSIS — D709 Neutropenia, unspecified: Secondary | ICD-10-CM | POA: Diagnosis present

## 2012-05-25 DIAGNOSIS — E440 Moderate protein-calorie malnutrition: Secondary | ICD-10-CM | POA: Diagnosis present

## 2012-05-25 DIAGNOSIS — B002 Herpesviral gingivostomatitis and pharyngotonsillitis: Secondary | ICD-10-CM | POA: Diagnosis present

## 2012-05-25 DIAGNOSIS — I10 Essential (primary) hypertension: Secondary | ICD-10-CM | POA: Diagnosis present

## 2012-05-25 DIAGNOSIS — E7800 Pure hypercholesterolemia, unspecified: Secondary | ICD-10-CM | POA: Diagnosis present

## 2012-05-25 DIAGNOSIS — R1310 Dysphagia, unspecified: Secondary | ICD-10-CM | POA: Diagnosis present

## 2012-05-25 DIAGNOSIS — Z9221 Personal history of antineoplastic chemotherapy: Secondary | ICD-10-CM

## 2012-05-25 DIAGNOSIS — T451X5A Adverse effect of antineoplastic and immunosuppressive drugs, initial encounter: Secondary | ICD-10-CM

## 2012-05-25 DIAGNOSIS — Z79899 Other long term (current) drug therapy: Secondary | ICD-10-CM

## 2012-05-25 DIAGNOSIS — IMO0002 Reserved for concepts with insufficient information to code with codable children: Secondary | ICD-10-CM

## 2012-05-25 DIAGNOSIS — K121 Other forms of stomatitis: Secondary | ICD-10-CM | POA: Diagnosis present

## 2012-05-25 DIAGNOSIS — K1230 Oral mucositis (ulcerative), unspecified: Secondary | ICD-10-CM

## 2012-05-25 DIAGNOSIS — E876 Hypokalemia: Secondary | ICD-10-CM | POA: Diagnosis present

## 2012-05-25 DIAGNOSIS — C3492 Malignant neoplasm of unspecified part of left bronchus or lung: Secondary | ICD-10-CM | POA: Diagnosis present

## 2012-05-25 DIAGNOSIS — Z923 Personal history of irradiation: Secondary | ICD-10-CM

## 2012-05-25 DIAGNOSIS — K219 Gastro-esophageal reflux disease without esophagitis: Secondary | ICD-10-CM | POA: Diagnosis present

## 2012-05-25 DIAGNOSIS — IMO0001 Reserved for inherently not codable concepts without codable children: Secondary | ICD-10-CM | POA: Diagnosis present

## 2012-05-25 DIAGNOSIS — K209 Esophagitis, unspecified without bleeding: Secondary | ICD-10-CM | POA: Diagnosis present

## 2012-05-25 DIAGNOSIS — D696 Thrombocytopenia, unspecified: Secondary | ICD-10-CM

## 2012-05-25 DIAGNOSIS — F17200 Nicotine dependence, unspecified, uncomplicated: Secondary | ICD-10-CM | POA: Diagnosis present

## 2012-05-25 DIAGNOSIS — Z720 Tobacco use: Secondary | ICD-10-CM | POA: Diagnosis present

## 2012-05-25 DIAGNOSIS — C799 Secondary malignant neoplasm of unspecified site: Secondary | ICD-10-CM

## 2012-05-25 DIAGNOSIS — F341 Dysthymic disorder: Secondary | ICD-10-CM | POA: Diagnosis not present

## 2012-05-25 DIAGNOSIS — E860 Dehydration: Secondary | ICD-10-CM | POA: Diagnosis present

## 2012-05-25 LAB — CBC
HCT: 38.7 % (ref 36.0–46.0)
Hemoglobin: 13.4 g/dL (ref 12.0–15.0)
MCH: 29.6 pg (ref 26.0–34.0)
MCHC: 34.6 g/dL (ref 30.0–36.0)
RBC: 4.52 MIL/uL (ref 3.87–5.11)

## 2012-05-25 LAB — CREATININE, SERUM
Creatinine, Ser: 0.59 mg/dL (ref 0.50–1.10)
GFR calc non Af Amer: >90 mL/min (ref >90)

## 2012-05-25 LAB — CBC WITH DIFFERENTIAL/PLATELET
BASO%: 0 % (ref 0.0–2.0)
LYMPH%: 51.6 % — ABNORMAL HIGH (ref 14.0–49.7)
MCH: 29.2 pg (ref 25.1–34.0)
MCHC: 33.7 g/dL (ref 31.5–36.0)
MCV: 86.7 fL (ref 79.5–101.0)
MONO%: 3.1 % (ref 0.0–14.0)
Platelets: 67 10*3/uL — ABNORMAL LOW (ref 145–400)
RBC: 4.52 10*6/uL (ref 3.70–5.45)
nRBC: 0 % (ref 0–0)

## 2012-05-25 LAB — COMPREHENSIVE METABOLIC PANEL (CC13)
ALT: 74 U/L — ABNORMAL HIGH (ref 0–55)
AST: 28 U/L (ref 5–34)
BUN: 19.3 mg/dL (ref 7.0–26.0)
CO2: 23 mEq/L (ref 22–29)
Creatinine: 0.7 mg/dL (ref 0.6–1.1)
Total Bilirubin: 0.92 mg/dL (ref 0.20–1.20)

## 2012-05-25 MED ORDER — FLUCONAZOLE IN SODIUM CHLORIDE 200-0.9 MG/100ML-% IV SOLN
200.0000 mg | INTRAVENOUS | Status: DC
  Filled 2012-05-25: qty 100

## 2012-05-25 MED ORDER — OXYCODONE-ACETAMINOPHEN 5-325 MG PO TABS: 1.0000 | ORAL_TABLET | Freq: Four times a day (QID) | ORAL | Status: DC | PRN

## 2012-05-25 MED ORDER — DEXAMETHASONE 0.5 MG PO TABS
1.0000 mg | ORAL_TABLET | Freq: Three times a day (TID) | ORAL | Status: DC
  Filled 2012-05-25 (×3): qty 2

## 2012-05-25 MED ORDER — PROCHLORPERAZINE MALEATE 10 MG PO TABS: 10.0000 mg | ORAL_TABLET | Freq: Four times a day (QID) | ORAL | Status: DC | PRN

## 2012-05-25 MED ORDER — OXYCODONE-ACETAMINOPHEN 5-325 MG PO TABS
1.0000 | ORAL_TABLET | Freq: Four times a day (QID) | ORAL | Status: DC | PRN
  Administered 2012-05-25 – 2012-05-29 (×4): 1 via ORAL
  Filled 2012-05-25 (×4): qty 1

## 2012-05-25 MED ORDER — FOLIC ACID 1 MG PO TABS
1.0000 mg | ORAL_TABLET | Freq: Every day | ORAL | Status: DC
  Administered 2012-05-26 – 2012-05-30 (×5): 1 mg via ORAL
  Filled 2012-05-25 (×6): qty 1

## 2012-05-25 MED ORDER — SODIUM CHLORIDE 0.9 % IV SOLN
INTRAVENOUS | Status: DC
  Administered 2012-05-25 – 2012-05-26 (×2): via INTRAVENOUS
  Administered 2012-05-27: 1000 mL via INTRAVENOUS
  Administered 2012-05-28 – 2012-05-30 (×3): via INTRAVENOUS

## 2012-05-25 MED ORDER — ENSURE COMPLETE PO LIQD
237.0000 mL | Freq: Three times a day (TID) | ORAL | Status: DC
  Administered 2012-05-26 – 2012-05-30 (×10): 237 mL via ORAL

## 2012-05-25 MED ORDER — MORPHINE SULFATE ER 30 MG PO TBCR
30.0000 mg | EXTENDED_RELEASE_TABLET | Freq: Two times a day (BID) | ORAL | Status: DC
  Administered 2012-05-25 – 2012-05-30 (×11): 30 mg via ORAL
  Filled 2012-05-25 (×11): qty 1

## 2012-05-25 MED ORDER — DEXAMETHASONE 0.5 MG PO TABS
1.0000 mg | ORAL_TABLET | Freq: Every day | ORAL | Status: DC
  Administered 2012-05-25 – 2012-05-30 (×6): 1 mg via ORAL
  Filled 2012-05-25 (×7): qty 2

## 2012-05-25 MED ORDER — PROMETHAZINE HCL 25 MG PO TABS: 25.0000 mg | ORAL_TABLET | Freq: Four times a day (QID) | ORAL | Status: DC | PRN

## 2012-05-25 MED ORDER — ZOLPIDEM TARTRATE 5 MG PO TABS
5.0000 mg | ORAL_TABLET | Freq: Once | ORAL | Status: AC
  Administered 2012-05-25: 5 mg via ORAL
  Filled 2012-05-25: qty 1

## 2012-05-25 MED ORDER — HEPARIN SODIUM (PORCINE) 5000 UNIT/ML IJ SOLN
5000.0000 [IU] | Freq: Three times a day (TID) | INTRAMUSCULAR | Status: DC
  Administered 2012-05-25 – 2012-05-27 (×6): 5000 [IU] via SUBCUTANEOUS
  Filled 2012-05-25 (×9): qty 1

## 2012-05-25 MED ORDER — FLUCONAZOLE IN SODIUM CHLORIDE 400-0.9 MG/200ML-% IV SOLN
400.0000 mg | INTRAVENOUS | Status: AC
  Administered 2012-05-25: 400 mg via INTRAVENOUS
  Filled 2012-05-25: qty 200

## 2012-05-25 NOTE — Progress Notes (Signed)
Called by Dimas Alexandria, PA with Dr. Shirline Frees Patient presented from  [facility] s/p 1 cycle of radiotherapy but has developed significant ora-pharyhngela thrush. VS seems stable, no fever or other issues. Seems to nee dIV anti-fungals./IVF Dr. Shirline Frees will follow in the Hospital.  Pleas Koch, MD [facility] [phone]

## 2012-05-25 NOTE — Telephone Encounter (Signed)
Patient here for labs and asked for oxycodone refill.  Is completely out.  Last fill was qty # 30 of percocet 5-325 take 1 po q 6 hrs prn pain.  Reports this is helping her pain.  Noted lips are white and her tongue has white spots.  Denies fever.  Denies n/v, diarrhea.  Has a cough and sore throat started yesterday.  Using Biotene mouthwash.  Noted ANC = 0.3. Dr. Arbutus Ped ordered refill with increased qty of sixty percocet.  Orders for patient to do salt water mouth rinses and this will clear as blood counts improve.  Patient notified.  Neulasta injections ordered and she will see A.P.P.  Patient has an [facility] cancer insurance policy and needs forms from provider with J-codes.  Managed care notified.  Paper work provided for patient.  Asked that she call us ahead of time for refills when she gets down to about ten pills or use MyChart so script can be ready for pick up in the future.

## 2012-05-25 NOTE — Progress Notes (Signed)
Pt admitted to hospital today.

## 2012-05-25 NOTE — Progress Notes (Signed)
INITIAL NUTRITION ASSESSMENT  Pt meets criteria for severe MALNUTRITION in the context of chronic illness as evidenced by <75% estimated energy intake with 12% weight loss in the past 2 months.  DOCUMENTATION CODES Per approved criteria  -Severe malnutrition in the context of chronic illness   INTERVENTION: - Ensure Complete TID - Magic cup with meals - Encouraged intake of small frequent high calorie/protein meals that are moist and non-acidic/spicy. Provided handout of this information.  - Will continue to monitor   NUTRITION DIAGNOSIS: Inadequate oral intake related to painful swallowing as evidenced by pt statement.   Goal: 1. Resolution of pain with swallowing 2. Pt to consume >90% of meals/supplements  Monitor:  Weights, labs, intake  Reason for Assessment: Nutrition risk   67 y.o. female  Admitting Dx: Disseminated candidiasis  ASSESSMENT: Pt with metastatic non-small cell lung CA s/p cycle 1 of chemotherapy given 05/18/12 and whole brain irradiation completed 05/03/12. Pt admitted with severe dysphagia and found to have disseminated candidiasis mucositis. Met with pt who reports poor intake for the past few days r/t severe sore throat. Pt seen by RD during admission last month and was started on Ensure Complete which pt has been consuming at least 1 per day at home. Pt reported during past admission poor intake/appetite as well with pt consuming 0-2 small meals/day. Pt reports very poor intake recently and has lost 17 pounds unintentionally in the past 2 months. Pt denies any problems swallowing, only pain, which she states is better today.   Height: Ht Readings from Last 1 Encounters:  05/25/12 5\' 5"  (1.651 m)    Weight: Wt Readings from Last 1 Encounters:  05/25/12 124 lb 1.6 oz (56.291 kg)    Ideal Body Weight: 125 lb  % Ideal Body Weight: 99  Wt Readings from Last 10 Encounters:  05/25/12 124 lb 1.6 oz (56.291 kg)  05/25/12 124 lb 1.6 oz (56.291 kg)   05/08/12 133 lb 9.6 oz (60.601 kg)  05/01/12 129 lb 14.4 oz (58.922 kg)  04/25/12 127 lb (57.607 kg)  04/24/12 130 lb 3.2 oz (59.058 kg)  04/19/12 131 lb 6.3 oz (59.6 kg)  04/19/12 131 lb 6.3 oz (59.6 kg)    Usual Body Weight: 141 lb in March 2014 per pt  % Usual Body Weight: 88  BMI:  Body mass index is 20.65 kg/(m^2).  Estimated Nutritional Needs: Kcal: 1400-1700 Protein: 55-70g Fluid: 1.4-1.7L/day  Skin: Intact   Diet Order: General  EDUCATION NEEDS: -Education needs addressed - discussed diet therapy for mucositis   No intake or output data in the 24 hours ending 05/25/12 1542  Last BM: PTA  Labs:   Recent Labs Lab 05/25/12 0929 05/25/12 1344  NA 136  --   K 3.6  --   CL 103  --   CO2 23  --   BUN 19.3  --   CREATININE 0.7 0.59  CALCIUM 8.4  --   GLUCOSE 127*  --     CBG (last 3)  No results found for this basename: GLUCAP,  in the last 72 hours  Scheduled Meds: . dexamethasone  1 mg Oral Daily  . [START ON 05/26/2012] fluconazole (DIFLUCAN) IV  200 mg Intravenous Q24H  . fluconazole (DIFLUCAN) IV  400 mg Intravenous Q24H  . folic acid  1 mg Oral Daily  . heparin  5,000 Units Subcutaneous Q8H  . morphine  30 mg Oral BID    Continuous Infusions: . sodium chloride 75 mL/hr at 05/25/12 1330  Past Medical History  Diagnosis Date  . Cancer   . Chicken pox   . GERD (gastroesophageal reflux disease)   . Hypertension   . High cholesterol   . Urine incontinence   . Kidney stone   . Lung cancer 05/08/2012    Past Surgical History  Procedure Laterality Date  . Abdominal hysterectomy    . Axillary lymph node biopsy Left 04/18/2012    Procedure: AXILLARY LYMPH NODE BIOPSY;  Surgeon: Adolph Pollack, MD;  Location: [facility];  Service: General;  Laterality: Left;  left subclavian. need one hour     Levon Hedger MS, RD, LDN [phone] Pager [phone] After Hours Pager

## 2012-05-25 NOTE — Telephone Encounter (Signed)
Neutropenic precautions reviewed with pt and neupogen injections ordered and scheduled per Dr Arbutus Ped.

## 2012-05-25 NOTE — H&P (Signed)
[facility] History and Physical  Yamila Cragin ZOX:[id number] DOB: Jan 30, 1945 DOA: 05/25/2012  Referring physician: Si Gaul PCP: [name], DO  Specialists: no other   Chief Complaint: Severe dysphagia  HPI: Shemika Robbs is a 67 y.o. female with recent hoopital admission 04/16/12 for acute encephalopathy and ultimately determined to have Metastatic NSCLC EGFR mutation as well as ALK gene translocation result is still pending. Underwent whole brain irradiation from 4/15-4/23/14 and started on systemic carboplatin/Altima 500/m2 on 5/8  She presented for a follow-up Med-Onc follow-up today and was noted to be feeling "rotten"  SHe elucidates that she has had "the worst sore throat " that she has ever ever had, cannot swallow food.  Can swallow a little water at a time.  No N/V-but did have some 2 weeks ago  [05/11/12].  Denies any sepcific constant CP but has intermittent pains helped by her pain medications NO dark or tarry stool- and if anythign she is constipated but not today.  No SOB at rest but does feel SOB since she has been "sick"  Review of Systems: Other than above states that she has had mild cough with some sputum started yesterday. She is now a burning urine dark stool in tarry stool but does feel difficulty when she does swallow. And this has limited her intake of food and diet over the past couple of days  Past Medical History  Diagnosis Date  . Cancer   . Chicken pox   . GERD (gastroesophageal reflux disease)   . Hypertension   . High cholesterol   . Urine incontinence   . Kidney stone   . Lung cancer 05/08/2012   Chart review  Admission 04/19/2012 with acute encephalopathy-found to have metastatic cancer and started on dexamethasone for cerebral metastases.  Hosptilazied in 2002 for Kidney stones  ?Abnormal PAP smears in the past-had a partial hysterectomy~1999  Past Surgical History  Procedure Laterality Date  . Abdominal hysterectomy    .  Axillary lymph node biopsy Left 04/18/2012    Procedure: AXILLARY LYMPH NODE BIOPSY;  Surgeon: Adolph Pollack, MD;  Location: [facility];  Service: General;  Laterality: Left;  left subclavian. need one hour   Social History:  reports that she has been smoking Cigarettes.  She has a 7.5 pack-year smoking history. She does not have any smokeless tobacco history on file. Her alcohol and drug histories are not on file.   No Known Allergies  Family History  Problem Relation Age of Onset  . Heart attack Father   . Kidney cancer Father   . Arthritis    . Hyperlipidemia    . Heart disease      Prior to Admission medications   Medication Sig Start Date End Date Taking? Authorizing Provider  dexamethasone (DECADRON) 4 MG tablet Take 1 tab PO TID with meals 04/24/12   Lonie Peak, MD  folic acid (FOLVITE) 1 MG tablet Take 1 tablet (1 mg total) by mouth daily. 05/08/12   Si Gaul, MD  HYDROcodone-acetaminophen (NORCO/VICODIN) 5-325 MG per tablet TAKE 1 TO 2 TABLETS EVERY 4 HOURS AS NEEDED FOR PAIN 05/13/12   Lonie Peak, MD  ibuprofen (ADVIL,MOTRIN) 200 MG tablet Take 200 mg by mouth every 6 (six) hours as needed for pain. For pain    Historical Provider, MD  morphine (MS CONTIN) 30 MG 12 hr tablet Take 1 tablet (30 mg total) by mouth 2 (two) times daily. 05/15/12   Si Gaul, MD  oxyCODONE-acetaminophen (PERCOCET/ROXICET) 5-325 MG per  tablet Take 1 tablet by mouth every 6 (six) hours as needed for pain. 05/25/12   Si Gaul, MD  prochlorperazine (COMPAZINE) 10 MG tablet Take 1 tablet (10 mg total) by mouth every 6 (six) hours as needed. 05/08/12   Si Gaul, MD  promethazine (PHENERGAN) 25 MG tablet Take 25 mg by mouth. Phenergan  25 mg tablets: Take 1 q 4 - 6 hours prn nausea.  Total 30 Tablets with 1 refill 04/26/12   Lonie Peak, MD   Physical Exam: Filed Vitals:   05/25/12 1215  BP: 135/68  Pulse: 75  Temp: 98.2 F (36.8 C)  TempSrc: Oral  Resp: 18  Height: 5\' 5"  (1.651  m)  Weight: 56.291 kg (124 lb 1.6 oz)  SpO2: 99%     General:  Alert appearing Caucasian female no apparent distress  Eyes: No pallor no icterus  ENT: Thick crusted white membranes to the entire anterior surface of the mouth.  Neck: Soft supple  Cardiovascular: S1-S2 no murmur or gallop  Respiratory: Clinically clear  Abdomen: Soft nontender nondistended  Skin: No lower extremity edema  Musculoskeletal: Range of motion intact  Psychiatric: Flat affect but lightens on discussing familiar and pleasant tincture  Neurologic: Intact  Labs on Admission:  Basic Metabolic Panel:  Recent Labs Lab 05/25/12 0929  NA 136  K 3.6  CL 103  CO2 23  GLUCOSE 127*  BUN 19.3  CREATININE 0.7  CALCIUM 8.4   Liver Function Tests:  Recent Labs Lab 05/25/12 0929  AST 28  ALT 74*  ALKPHOS 55  BILITOT 0.92  PROT 5.6*  ALBUMIN 2.7*   No results found for this basename: LIPASE, AMYLASE,  in the last 168 hours No results found for this basename: AMMONIA,  in the last 168 hours CBC:  Recent Labs Lab 05/25/12 0929  WBC 0.6*  NEUTROABS 0.3*  HGB 13.2  HCT 39.2  MCV 86.7  PLT 67*   Cardiac Enzymes: No results found for this basename: CKTOTAL, CKMB, CKMBINDEX, TROPONINI,  in the last 168 hours  BNP (last 3 results) No results found for this basename: PROBNP,  in the last 8760 hours CBG: No results found for this basename: GLUCAP,  in the last 168 hours  Radiological Exams on Admission: No results found.  EKG: Independently reviewed. None performed  Assessment/Plan Principal Problem:   Disseminated candidiasis Active Problems:   Tobacco abuse   Elevated blood pressure   Putative Brain Metastases   Lung cancer   Dysthymic disorder   Drug induced neutropenia(288.03)   1. Disseminated candidiasis-probably related to #2, and certainly not made better by the fact that patient is on systemic dexamethasone.  Will start fluconazole 400 mg IV loading dose and 20 mg  daily. Expect that she'll need at least 2-3 days IV therapies before she can take by mouth. If she does not resolve within 72 hours in terms of response, consider switching to Itraconazole versus endoscopy.  Monitor LFTs. 2. Metastatic NSCLC, completed first course of Carboplatin/Altima 05/18/12, currently on Dexamethasone 1 mg tid [instructions were to taper to no steroids on 5/9-see Dr. Karoline Caldwell note dated 4/21]-Spoke with Dr. Roselind Messier of Rad-Onc about Dexamethasone who recommends to continue steroids at 1 mg daily and stop in about 3-4 days 3. Tobacco abuse.  congratulated on trying to taper from 2 ppd/40 yrs.  Encouraged further cessation efforts. 4. htn-currently controlled.  Monitor 5. Neutropenia and TCP-Neutropenic precautions in place.  Hold Antibiotics for now given no fever.  Obtain CXR to  rule out concern for Pneumonia with her h/o cough.  Probably result of Systemic Chemotherapy-defer decision for GM-CSF [Neulasta/Neupogen] to Oncologist-Dr. Shirline Frees aware of admission and will see in consult 6. Dysthymia-reports poor sleep habits and anxitey.  Encouraged prn use of sleep aide-may call Chaplain if prn  See notes  Code Status: Presumed Full Family Communication: discussed in detail with daughter at bedside  Disposition Plan: inpatient   Time spent: 68  Mahala Menghini [facility] Pager [phone]  If 7PM-7AM, please contact night-coverage [email or web address] Password [facility] 05/25/2012, 12:33 PM

## 2012-05-25 NOTE — Patient Instructions (Addendum)
You are being admitted  today due to your low white blood count and severe yeast mucositis. Followup with either Dr. Arbutus Ped or Tiana Loft, PA-C on 06/08/2012 prior to your next scheduled cycle of chemotherapy.

## 2012-05-25 NOTE — Progress Notes (Signed)
Quick Note:  Call patient with the result and arrange for Neupogen X 3 days ______

## 2012-05-25 NOTE — Care Management Note (Signed)
CARE MANAGEMENT NOTE 05/25/2012  Patient:  MELADY, CHOW   Account Number:  [id number]  Date Initiated:  05/25/2012  Documentation initiated by:  Bharath Bernstein  Subjective/Objective Assessment:   67 yo female admitted with Disseminated candidiasis. PTA pt from home.     Action/Plan:   Home when stable   Anticipated DC Date:     Anticipated DC Plan:  HOME/SELF CARE      DC Planning Services  CM consult      Choice offered to / List presented to:  NA   DME arranged  NA      DME agency  NA     HH arranged  NA      HH agency  NA   Status of service:  In process, will continue to follow Medicare Important Message given?   (If response is "NO", the following Medicare IM given date fields will be blank) Date Medicare IM given:   Date Additional Medicare IM given:    Discharge Disposition:    Per UR Regulation:  Reviewed for med. necessity/level of care/duration of stay  If discussed at Long Length of Stay Meetings, dates discussed:    Comments:  05/25/12 1348 Braelynne Garinger,RN,BSN [phone] No needs identified at this time. PTA pt from home. Cm to contiue to follow for any or barriers or identified needs prior to discharge.

## 2012-05-25 NOTE — Progress Notes (Signed)
ANTIBIOTIC CONSULT NOTE - INITIAL  Pharmacy Consult for fluconazole Indication: esophageal candidiasis in neutropenic patient  No Known Allergies  Patient Measurements: Height: 5\' 5"  (165.1 cm) Weight: 124 lb 1.6 oz (56.291 kg) IBW/kg (Calculated) : 57  Vital Signs: Temp: 98.2 F (36.8 C) (05/15 1215) Temp src: Oral (05/15 1215) BP: 135/68 mmHg (05/15 1215) Pulse Rate: 75 (05/15 1215) Intake/Output from previous day:   Intake/Output from this shift:    Labs:  Recent Labs  05/25/12 0929  WBC 0.6*  HGB 13.2  PLT 67*  CREATININE 0.7   Estimated Creatinine Clearance: 63.1 ml/min (by C-G formula based on Cr of 0.7). No results found for this basename: VANCOTROUGH, VANCOPEAK, VANCORANDOM, GENTTROUGH, GENTPEAK, GENTRANDOM, TOBRATROUGH, TOBRAPEAK, TOBRARND, AMIKACINPEAK, AMIKACINTROU, AMIKACIN,  in the last 72 hours   Microbiology: No results found for this or any previous visit (from the past 720 hour(s)).  Medical History: Past Medical History  Diagnosis Date  . Cancer   . Chicken pox   . GERD (gastroesophageal reflux disease)   . Hypertension   . High cholesterol   . Urine incontinence   . Kidney stone   . Lung cancer 05/08/2012    Medications:  Prescriptions prior to admission  Medication Sig Dispense Refill  . dexamethasone (DECADRON) 4 MG tablet Take 1 tab PO TID with meals  90 tablet  1  . folic acid (FOLVITE) 1 MG tablet Take 1 tablet (1 mg total) by mouth daily.  30 tablet  3  . HYDROcodone-acetaminophen (NORCO/VICODIN) 5-325 MG per tablet TAKE 1 TO 2 TABLETS EVERY 4 HOURS AS NEEDED FOR PAIN  60 tablet  0  . ibuprofen (ADVIL,MOTRIN) 200 MG tablet Take 200 mg by mouth every 6 (six) hours as needed for pain. For pain      . morphine (MS CONTIN) 30 MG 12 hr tablet Take 1 tablet (30 mg total) by mouth 2 (two) times daily.  60 tablet  0  . oxyCODONE-acetaminophen (PERCOCET/ROXICET) 5-325 MG per tablet Take 1 tablet by mouth every 6 (six) hours as needed for  pain.  60 tablet  0  . prochlorperazine (COMPAZINE) 10 MG tablet Take 1 tablet (10 mg total) by mouth every 6 (six) hours as needed.  60 tablet  0  . promethazine (PHENERGAN) 25 MG tablet Take 25 mg by mouth. Phenergan  25 mg tablets: Take 1 q 4 - 6 hours prn nausea.  Total 30 Tablets with 1 refill       Assessment: 67yo F w/ recent diagnosis of NSCLC w/ brain mets, s/p recent XRT and chemo on 05/18/12 presents with neutropenia, weakness, and severe dysphagia.  Pharmacy asked to dose fluconazole for esophageal candidiasis.  Afebrile.  ANC 0.3. No record of Neulasta or Neupogen administration.  CrCl 19ml/min.  No cultures.  Goal of Therapy:  Eradication of infection and appropriate regimen for renal fxn.  Plan:   Fluconazole 400mg  IV x 1 then 200mg  IV q24h.  F/u daily.  Charolotte Eke, PharmD, pager [phone]. 05/25/2012,1:30 PM.

## 2012-05-25 NOTE — Progress Notes (Signed)
[facility] Telephone:([phone]) [phone]   [phone]) [phone]  OFFICE PROGRESS NOTE  [name], DO [address] [place] [address]  DIAGNOSIS: Metastatic non-small cell lung cancer, adenocarcinoma diagnosed in April 2014. EGFR mutation as well as ALK gene translocation result is still pending.  PRIOR THERAPY: Status post whole brain irradiation under the care of Dr. Selina Cooley completed on 05/03/2012  CURRENT THERAPY: The patient is expected to start the first cycle of systemic chemotherapy was carboplatin for AUC of 5 and Alimta 500 mg/M2 every 3 weeks status post 1 cycle given on 05/18/2012  INTERVAL HISTORY: Isabella Carpenter 67 y.o. female returns to the clinic today for followup visit accompanied by her daughter. The patient came in for her weekly laboratory evaluation was found to be neutropenic and is seen as a work in visit today. She complains of feeling very weak as well as pain with swallowing. She states that her mouth and throat were significantly worse yesterday. Today she is able to swallow her medications. She is accompanied her will do to the discomfort in her mouth. She denied any fever or chills. She's had some mild constipation but no difficulty with diarrhea.she initially had some nausea and vomiting right after her first cycle of chemotherapy but none recently . She denied having any significant chest pain, shortness of breath, cough or hemoptysis. The patient denied having any significant night sweats. Due to her poor by mouth intake she has lost several pounds since her office visit on 05/08/2012.  MEDICAL HISTORY: Past Medical History  Diagnosis Date  . Cancer   . Chicken pox   . GERD (gastroesophageal reflux disease)   . Hypertension   . High cholesterol   . Urine incontinence   . Kidney stone   . Lung cancer 05/08/2012    ALLERGIES:  has No Known Allergies.  MEDICATIONS:  Current Outpatient Prescriptions  Medication Sig Dispense  Refill  . dexamethasone (DECADRON) 4 MG tablet Take 1 tab PO TID with meals  90 tablet  1  . folic acid (FOLVITE) 1 MG tablet Take 1 tablet (1 mg total) by mouth daily.  30 tablet  3  . HYDROcodone-acetaminophen (NORCO/VICODIN) 5-325 MG per tablet TAKE 1 TO 2 TABLETS EVERY 4 HOURS AS NEEDED FOR PAIN  60 tablet  0  . ibuprofen (ADVIL,MOTRIN) 200 MG tablet Take 200 mg by mouth every 6 (six) hours as needed for pain. For pain      . morphine (MS CONTIN) 30 MG 12 hr tablet Take 1 tablet (30 mg total) by mouth 2 (two) times daily.  60 tablet  0  . oxyCODONE-acetaminophen (PERCOCET/ROXICET) 5-325 MG per tablet Take 1 tablet by mouth every 6 (six) hours as needed for pain.  60 tablet  0  . prochlorperazine (COMPAZINE) 10 MG tablet Take 1 tablet (10 mg total) by mouth every 6 (six) hours as needed.  60 tablet  0  . promethazine (PHENERGAN) 25 MG tablet Take 25 mg by mouth. Phenergan  25 mg tablets: Take 1 q 4 - 6 hours prn nausea.  Total 30 Tablets with 1 refill       No current facility-administered medications for this visit.    SURGICAL HISTORY:  Past Surgical History  Procedure Laterality Date  . Abdominal hysterectomy    . Axillary lymph node biopsy Left 04/18/2012    Procedure: AXILLARY LYMPH NODE BIOPSY;  Surgeon: Adolph Pollack, MD;  Location: [facility];  Service: General;  Laterality: Left;  left subclavian. need one hour    REVIEW OF SYSTEMS:  A comprehensive review of systems was negative except for: Constitutional: positive for anorexia, fatigue and weight loss Ears, nose, mouth, throat, and face: positive for sore mouth and sore throat Respiratory: positive for dyspnea on exertion Gastrointestinal: positive for constipation, nausea and vomiting   PHYSICAL EXAMINATION: General appearance: alert, cooperative, fatigued, no distress and Looks as though she does not feel well however is not acutely toxic Head: Normocephalic, without obvious abnormality, atraumatic Neck: no adenopathy and  Well-healed surgical incision from lymph node biopsy left neck, supraclavicular area Lymph nodes: Cervical, supraclavicular, and axillary nodes normal. Resp: clear to auscultation bilaterally Cardio: regular rate and rhythm, S1, S2 normal, no murmur, click, rub or gallop GI: soft, non-tender; bowel sounds normal; no masses,  no organomegaly Extremities: extremities normal, atraumatic, no cyanosis or edema Neurologic: Alert and oriented X 3, normal strength and tone. Normal symmetric reflexes. Normal coordination and gait Mouth: Reveals significant oral candidiasis affecting the tongue, mucosa and soft palate and I suspect the esophagus as well, some areas of mucositis as well are noted  ECOG PERFORMANCE STATUS: 1 - Symptomatic but completely ambulatory  Blood pressure 127/73, pulse 73, temperature 98.6 F (37 C), temperature source Oral, resp. rate 17, height 5' 5.25" (1.657 m), weight 124 lb 1.6 oz (56.291 kg). Weight is down 9.5 pounds from 05/08/2012  LABORATORY DATA: Lab Results  Component Value Date   WBC 0.6* 05/25/2012   HGB 13.2 05/25/2012   HCT 39.2 05/25/2012   MCV 86.7 05/25/2012   PLT 67* 05/25/2012      Chemistry      Component Value Date/Time   NA 136 05/25/2012 0929   NA 137 04/17/2012 0423   K 3.6 05/25/2012 0929   K 3.9 04/17/2012 0423   CL 103 05/25/2012 0929   CL 102 04/17/2012 0423   CO2 23 05/25/2012 0929   CO2 27 04/17/2012 0423   BUN 19.3 05/25/2012 0929   BUN 6 04/17/2012 0423   CREATININE 0.7 05/25/2012 0929   CREATININE 0.62 04/17/2012 0423      Component Value Date/Time   CALCIUM 8.4 05/25/2012 0929   CALCIUM 9.1 04/17/2012 0423   ALKPHOS 55 05/25/2012 0929   AST 28 05/25/2012 0929   ALT 74* 05/25/2012 0929   BILITOT 0.92 05/25/2012 0929       RADIOGRAPHIC STUDIES: Dg Chest 2 View  04/16/2012  *RADIOLOGY REPORT*  Clinical Data: Chest pain.  Neck pain.  Tobacco use.  CHEST - 2 VIEW  Comparison: None.  Findings: Scattered pulmonary nodules and pulmonary masses are  observed bilaterally.  An index mass in the right to middle lobe measures 5.4 x 3.2 by 5.2 cm.  Mass-like scarring noted along the left lung apex.  No cardiomegaly noted, but the aortic arch seems to be right-sided.  Unusual lucency at the T12-L1 level on the frontal projection may to be due to bowel gas although a vertebral anomaly cannot be totally excluded.  No pleural effusion is identified.  IMPRESSION:  1.  Scattered nodules and masses throughout both lungs.  Appearance favors metastatic malignancy to both lungs.  Fungal pneumonia and rheumatoid nodules are less likely differential diagnostic considerations.  CT chest with contrast is recommended for further workup, and considering the bilaterality of disease, imaging of the abdomen and pelvis may be prudent as well. 2.  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 3.  Lucency at the thoracolumbar junction on the frontal  projection is somewhat unusual but probably simply due to bowel gas.  This could be further characterized on the CT exam to ensure that does not represent a vertebral anomaly.   Original Report Authenticated By: Gaylyn Rong, M.D.    Ct Head W Wo Contrast  04/16/2012  *RADIOLOGY REPORT*  Clinical Data: Forgetful; known lung mass.  Assess for brain metastases.  CT HEAD WITHOUT AND WITH CONTRAST  Technique:  Contiguous axial images were obtained from the base of the skull through the vertex without and with intravenous contrast.  Contrast: OMNIPAQUE IOHEXOL 300 MG/ML  SOLN  Comparison: None.  Findings: Evaluation is suboptimal due to motion artifact. However, multiple prominent areas of decreased attenuation are seen throughout the cerebral hemispheres and at the left side of the cerebellum, compatible with multiple metastases.  Identifiable masses measure perhaps 2.0 cm at the high left frontal lobe, 2.5 cm at the expected location of the frontal horn of the left lateral ventricle, 1.5 cm at the left occipital lobe, 1.5  cm at the left cerebellar hemisphere and 1.7 cm at the posterior right parietal lobe.  There is perhaps 7 mm of rightward midline shift, though this is difficult to fully characterize.  There is no evidence of acute infarction, or intra- or extra-axial hemorrhage on CT.  There is no evidence of fracture; visualized osseous structures are unremarkable in appearance.  The visualized portions of the orbits are within normal limits.  The paranasal sinuses and mastoid air cells are well-aerated.  No significant soft tissue abnormalities are seen.  IMPRESSION:  1.  Multiple cerebral and cerebellar metastases noted, with extensive areas of decreased attenuation, effacement of the frontal horn of the left lateral ventricle and perhaps 7 mm of rightward midline shift. 2.  No definite evidence of intracranial hemorrhage.  These results were called by telephone on 04/16/2012 at 09:21 p.m. to Earley Favor PA, who verbally acknowledged these results.   Original Report Authenticated By: Tonia Ghent, M.D.    Ct Chest W Contrast  04/16/2012  *RADIOLOGY REPORT*  Clinical Data:  Assess for chest and abdominal metastatic disease; known lung masses.  CT CHEST, ABDOMEN AND PELVIS WITH CONTRAST  Technique:  Multidetector CT imaging of the chest, abdomen and pelvis was performed following the standard protocol during bolus administration of intravenous contrast.  Contrast: OMNIPAQUE IOHEXOL 300 MG/ML  SOLN  Comparison:   None.  CT CHEST  Findings:  There are two dominant masses within the lungs, with a third somewhat smaller mass also seen.  The largest mass is noted at the left upper lobe, with significant architectural distortion; it measures approximately 8.6 x 4.4 x 6.5 cm, with partial occlusion of the bronchioles to the left upper lobe, and marked mass effect on the left main pulmonary artery.  A large 6.2 x 3.7 x 3.9 cm mass is noted within the right middle lobe.  There is also a spiculated 3.1 x 3.1 x 2.4 cm mass at the  right upper lobe, with significant architectural distortion.  Innumerable smaller metastases are noted throughout both lungs.  No pleural effusion is identified.  There is mild underlying emphysema, noted particularly at the right upper lobe.  No pneumothorax is seen.  Scattered coronary artery calcifications are noted.  The patient's left upper lobe mass extends into the mediastinum, with irregular lobulations.  It remains a few millimeters from both the trachea and the descending thoracic aorta, though slightly closer to the course of the left subclavian artery.  Scattered  metastases are noted within the mediastinum, demonstrating centrally decreased attenuation.  These are noted at the right paratracheal and periaortic regions, measuring up to 1.3 cm in short axis.  No pericardial effusion is identified. Scattered calcific atherosclerotic disease is noted along the aortic arch.  The great vessels are grossly unremarkable in appearance.  Minimal hypodensity at the left thyroid lobe is nonspecific in appearance, and likely benign given its size.  An enlarged 1.4 cm left supraclavicular node is seen.  Scattered axillary nodes remain normal in size.  No acute osseous abnormalities are seen.  IMPRESSION:  1.  Two dominant masses within the lungs, at the left upper lobe and right middle lobe.  Smaller spiculated mass at the right upper lobe.  Significant associated architectural distortion; the left upper lobe mass extends into the mediastinum.  Partial occlusion of the bronchus to the left upper lobe, and significant mass effect on the left main pulmonary artery. 2.  Innumerable small metastases noted throughout both lungs. 3.  Mild underlying emphysema, particularly at the right upper lobe. 4.  Scattered coronary artery calcifications seen. 5.  Scattered metastases within the mediastinum, at the right paratracheal and periaortic regions, measuring up to 1.3 cm in short axis. 6.  1.4 cm centrally hypodense left  supraclavicular node, concerning for metastatic disease. 7.  Minimal hypodensity at the left thyroid lobe is nonspecific, and likely benign given its size.  CT ABDOMEN AND PELVIS  Findings:  A few hypodensities within the liver are nonspecific but likely reflect small cysts.  The liver is otherwise unremarkable in appearance.  Two vague hypodense lesions are noted within the spleen, measuring 2.0 and 1.5 cm, concerning for metastases.  There is a large complex heterogeneous left adrenal mass, measuring 4.6 cm in size, concerning for metastatic disease.  The right adrenal gland is unremarkable in appearance.  A large stone is noted within the gallbladder; the gallbladder is otherwise grossly unremarkable.  The pancreas is grossly unremarkable.  A nonobstructing 7 mm stone is noted at the interpole region of the left kidney.  Scattered bilateral renal cysts are seen, measuring up to 1.7 cm in size. There is no evidence of hydronephrosis.  No obstructing ureteral stones are seen.  No perinephric stranding is appreciated.  No free fluid is identified.  The small bowel is unremarkable in appearance.  The stomach is within normal limits.  No acute vascular abnormalities are seen.  Scattered calcification is noted along the abdominal aorta and its branches.  The appendix is normal in caliber, without evidence for appendicitis.  The colon is unremarkable in appearance.  The bladder is mildly distended and grossly unremarkable in appearance.  The patient is status post hysterectomy.  No suspicious adnexal masses are seen; the left ovary is unremarkable in appearance.  No inguinal lymphadenopathy is seen.  No acute osseous abnormalities are identified.  There is nonspecific sclerosis at the pubic symphysis, and vacuum phenomenon and mild sclerotic change are noted at the sacroiliac joints bilaterally.  Degenerative change is noted along the lower lumbar spine.  IMPRESSION:  1.  Large complex left adrenal mass, measuring 4.6  cm, concerning for metastatic disease. 2.  Two vague hypodensities within the spleen, concerning for metastatic disease. 3.  Small hepatic and renal hypodensities likely reflect cysts. 4.  Cholelithiasis; gallbladder otherwise unremarkable in appearance. 5.  Nonobstructing 7 mm stone at the interpole region of the left kidney. 6.  Scattered calcification along the abdominal aorta and its branches. 7.  No definite evidence  for osseous metastatic disease; nonspecific sclerosis at the pubic symphysis, and mild degenerative change along the lower lumbar spine and at the sacroiliac joints.   Original Report Authenticated By: Tonia Ghent, M.D.    Ct Abdomen Pelvis W Contrast  04/16/2012  *RADIOLOGY REPORT*  Clinical Data:  Assess for chest and abdominal metastatic disease; known lung masses.  CT CHEST, ABDOMEN AND PELVIS WITH CONTRAST  Technique:  Multidetector CT imaging of the chest, abdomen and pelvis was performed following the standard protocol during bolus administration of intravenous contrast.  Contrast: OMNIPAQUE IOHEXOL 300 MG/ML  SOLN  Comparison:   None.  CT CHEST  Findings:  There are two dominant masses within the lungs, with a third somewhat smaller mass also seen.  The largest mass is noted at the left upper lobe, with significant architectural distortion; it measures approximately 8.6 x 4.4 x 6.5 cm, with partial occlusion of the bronchioles to the left upper lobe, and marked mass effect on the left main pulmonary artery.  A large 6.2 x 3.7 x 3.9 cm mass is noted within the right middle lobe.  There is also a spiculated 3.1 x 3.1 x 2.4 cm mass at the right upper lobe, with significant architectural distortion.  Innumerable smaller metastases are noted throughout both lungs.  No pleural effusion is identified.  There is mild underlying emphysema, noted particularly at the right upper lobe.  No pneumothorax is seen.  Scattered coronary artery calcifications are noted.  The patient's left upper  lobe mass extends into the mediastinum, with irregular lobulations.  It remains a few millimeters from both the trachea and the descending thoracic aorta, though slightly closer to the course of the left subclavian artery.  Scattered metastases are noted within the mediastinum, demonstrating centrally decreased attenuation.  These are noted at the right paratracheal and periaortic regions, measuring up to 1.3 cm in short axis.  No pericardial effusion is identified. Scattered calcific atherosclerotic disease is noted along the aortic arch.  The great vessels are grossly unremarkable in appearance.  Minimal hypodensity at the left thyroid lobe is nonspecific in appearance, and likely benign given its size.  An enlarged 1.4 cm left supraclavicular node is seen.  Scattered axillary nodes remain normal in size.  No acute osseous abnormalities are seen.  IMPRESSION:  1.  Two dominant masses within the lungs, at the left upper lobe and right middle lobe.  Smaller spiculated mass at the right upper lobe.  Significant associated architectural distortion; the left upper lobe mass extends into the mediastinum.  Partial occlusion of the bronchus to the left upper lobe, and significant mass effect on the left main pulmonary artery. 2.  Innumerable small metastases noted throughout both lungs. 3.  Mild underlying emphysema, particularly at the right upper lobe. 4.  Scattered coronary artery calcifications seen. 5.  Scattered metastases within the mediastinum, at the right paratracheal and periaortic regions, measuring up to 1.3 cm in short axis. 6.  1.4 cm centrally hypodense left supraclavicular node, concerning for metastatic disease. 7.  Minimal hypodensity at the left thyroid lobe is nonspecific, and likely benign given its size.  CT ABDOMEN AND PELVIS  Findings:  A few hypodensities within the liver are nonspecific but likely reflect small cysts.  The liver is otherwise unremarkable in appearance.  Two vague hypodense  lesions are noted within the spleen, measuring 2.0 and 1.5 cm, concerning for metastases.  There is a large complex heterogeneous left adrenal mass, measuring 4.6 cm in size, concerning for  metastatic disease.  The right adrenal gland is unremarkable in appearance.  A large stone is noted within the gallbladder; the gallbladder is otherwise grossly unremarkable.  The pancreas is grossly unremarkable.  A nonobstructing 7 mm stone is noted at the interpole region of the left kidney.  Scattered bilateral renal cysts are seen, measuring up to 1.7 cm in size. There is no evidence of hydronephrosis.  No obstructing ureteral stones are seen.  No perinephric stranding is appreciated.  No free fluid is identified.  The small bowel is unremarkable in appearance.  The stomach is within normal limits.  No acute vascular abnormalities are seen.  Scattered calcification is noted along the abdominal aorta and its branches.  The appendix is normal in caliber, without evidence for appendicitis.  The colon is unremarkable in appearance.  The bladder is mildly distended and grossly unremarkable in appearance.  The patient is status post hysterectomy.  No suspicious adnexal masses are seen; the left ovary is unremarkable in appearance.  No inguinal lymphadenopathy is seen.  No acute osseous abnormalities are identified.  There is nonspecific sclerosis at the pubic symphysis, and vacuum phenomenon and mild sclerotic change are noted at the sacroiliac joints bilaterally.  Degenerative change is noted along the lower lumbar spine.  IMPRESSION:  1.  Large complex left adrenal mass, measuring 4.6 cm, concerning for metastatic disease. 2.  Two vague hypodensities within the spleen, concerning for metastatic disease. 3.  Small hepatic and renal hypodensities likely reflect cysts. 4.  Cholelithiasis; gallbladder otherwise unremarkable in appearance. 5.  Nonobstructing 7 mm stone at the interpole region of the left kidney. 6.  Scattered  calcification along the abdominal aorta and its branches. 7.  No definite evidence for osseous metastatic disease; nonspecific sclerosis at the pubic symphysis, and mild degenerative change along the lower lumbar spine and at the sacroiliac joints.   Original Report Authenticated By: Tonia Ghent, M.D.     ASSESSMENT/PLAN: This is a very pleasant 67 years old white female with metastatic non-small cell lung cancer, adenocarcinoma with brain metastasis status post whole brain irradiation. The molecular study for EGFR mutation as well as ALK gene translocation is still pending. She status post 1 cycle of systemic chemotherapy with carboplatin for an AUC of 5 and Alimta 500 mg router squared given every 3 weeks. His first cycle of chemotherapy was given on 05/18/2012. Patient was discussed with Dr. Arbutus Ped. She is neutropenic today with a total white count of 0.6 and ANC of 0.3. She's afebrile, however she does have a significant case of candida mucositis which I suspect also involves her esophagus. Due to the fact that she is neutropenic and has such a significant fungal infection the patient will be admitted to the inpatient service for further evaluation and management. I personally spoke with Dr. Mahala Menghini who agreed to the mid the patient to the hospitalist service, being assigned to hospitalist team #7. She will likely need IV fluids as well as empiric IV antibiotic coverage perhaps with Avelox. She will between the IV Diflucan to address her severe oral candidiasis mucositis. Her neutropenia we recommend she be placed on Neupogen 300 mcg subcutaneously until the ANC is greater than 1.0. Dr. Arbutus Ped will continue to follow the patient while she is admitted. Patient is due for her second cycle of systemic chemotherapy with carboplatin and Alimta on 06/08/2012. She will have a followup office visit that day to review her blood counts prior to proceeding with her second cycle of chemotherapy. This may  need to be  adjusted pending the length of her current admission.  Laural Benes, Doll Frazee E, PA-C   The patient was advised to call immediately if she has any concerning symptoms in the interval.  All questions were answered. The patient knows to call the clinic with any problems, questions or concerns. We can certainly see the patient much sooner if necessary.  I spent 20 minutes counseling the patient face to face. The total time spent in the appointment was 30 minutes.

## 2012-05-26 ENCOUNTER — Ambulatory Visit: Payer: Commercial Managed Care - PPO

## 2012-05-26 ENCOUNTER — Telehealth: Payer: Self-pay | Admitting: *Deleted

## 2012-05-26 DIAGNOSIS — D696 Thrombocytopenia, unspecified: Secondary | ICD-10-CM | POA: Diagnosis present

## 2012-05-26 DIAGNOSIS — K121 Other forms of stomatitis: Secondary | ICD-10-CM | POA: Diagnosis present

## 2012-05-26 DIAGNOSIS — R1310 Dysphagia, unspecified: Secondary | ICD-10-CM | POA: Diagnosis present

## 2012-05-26 DIAGNOSIS — D709 Neutropenia, unspecified: Secondary | ICD-10-CM | POA: Diagnosis present

## 2012-05-26 DIAGNOSIS — K1230 Oral mucositis (ulcerative), unspecified: Secondary | ICD-10-CM | POA: Diagnosis present

## 2012-05-26 LAB — COMPREHENSIVE METABOLIC PANEL
ALT: 58 U/L — ABNORMAL HIGH (ref 0–35)
Albumin: 2.7 g/dL — ABNORMAL LOW (ref 3.5–5.2)
Alkaline Phosphatase: 52 U/L (ref 39–117)
Chloride: 101 mEq/L (ref 96–112)
Glucose, Bld: 92 mg/dL (ref 70–99)
Potassium: 4.2 mEq/L (ref 3.5–5.1)
Sodium: 135 mEq/L (ref 135–145)
Total Bilirubin: 0.9 mg/dL (ref 0.3–1.2)
Total Protein: 5.4 g/dL — ABNORMAL LOW (ref 6.0–8.3)

## 2012-05-26 LAB — CBC
HCT: 36.9 % (ref 36.0–46.0)
MCHC: 33.6 g/dL (ref 30.0–36.0)
MCV: 85.2 fL (ref 78.0–100.0)
Platelets: 50 10*3/uL — ABNORMAL LOW (ref 150–400)
RDW: 15.2 % (ref 11.5–15.5)

## 2012-05-26 MED ORDER — DEXTROSE 5 % IV SOLN
5.0000 mg/kg | Freq: Three times a day (TID) | INTRAVENOUS | Status: DC
  Administered 2012-05-26 – 2012-05-30 (×12): 280 mg via INTRAVENOUS
  Filled 2012-05-26 (×15): qty 5.6

## 2012-05-26 MED ORDER — NYSTATIN 100000 UNIT/ML MT SUSP
5.0000 mL | Freq: Four times a day (QID) | OROMUCOSAL | Status: DC
  Administered 2012-05-26 – 2012-05-30 (×16): 500000 [IU] via ORAL
  Filled 2012-05-26 (×19): qty 5

## 2012-05-26 MED ORDER — FILGRASTIM 300 MCG/ML IJ SOLN
300.0000 ug | Freq: Every day | INTRAMUSCULAR | Status: DC
  Administered 2012-05-26 – 2012-05-29 (×4): 300 ug via SUBCUTANEOUS
  Filled 2012-05-26 (×6): qty 1

## 2012-05-26 MED ORDER — ZOLPIDEM TARTRATE 5 MG PO TABS
5.0000 mg | ORAL_TABLET | Freq: Every evening | ORAL | Status: DC | PRN
  Administered 2012-05-26: 5 mg via ORAL
  Filled 2012-05-26: qty 1

## 2012-05-26 MED ORDER — FLUCONAZOLE IN SODIUM CHLORIDE 400-0.9 MG/200ML-% IV SOLN
400.0000 mg | INTRAVENOUS | Status: AC
  Administered 2012-05-26: 400 mg via INTRAVENOUS
  Filled 2012-05-26: qty 200

## 2012-05-26 MED ORDER — FILGRASTIM 480 MCG/1.6ML IJ SOLN: 480.0000 ug | Freq: Every day | INTRAMUSCULAR | Status: DC

## 2012-05-26 MED ORDER — MAGIC MOUTHWASH W/LIDOCAINE
5.0000 mL | Freq: Four times a day (QID) | ORAL | Status: DC | PRN
  Administered 2012-05-27 – 2012-05-29 (×3): 5 mL via ORAL
  Filled 2012-05-26 (×3): qty 5

## 2012-05-26 NOTE — Progress Notes (Addendum)
[facility] PROGRESS NOTE  Ronne Savoia XBJ:[id number] DOB: July 29, 1945 DOA: 05/25/2012 PCP: [name], DO  Brief narrative: 67 year old female with past medical history of metastatic non-small cell lung cancer, adenocarcinoma (diagnosed in April 2014), receiving systemic chemotherapy last cycle given 05/18/2012 who presented to [facility] 05/25/2012 with progressive dysphagia and odynophagia.  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 - will add acyclovir and will see if any symptomatic relief   Non small cell lung cancer with metastasis - management per oncology, next chemo 06/08/2012   Neutropenia - secondary to sequela of chemotherapy - add neupogen 300 mcg today   Thrombocytopenia, unspecified - secondary to sequela of chemotherapy - no signs of bleeding - continue to monitor CBC   Anemia of chronic disease - secondary to history of malignancy and sequela of chemotherapy - continue to monitor CBC   Moderate protein calorie malnutrition - secondary to poor oral intake - hope she will feel better with the above regimen and may increase her PO intake  Code Status: full code Family Communication: family at the bedside Disposition Plan: home when stable  Manson Passey, MD  [facility] Pager [phone]  If 7PM-7AM, please contact night-coverage [email or web address] Password [email or web address] 05/26/2012, 3:12 PM   LOS: 1 day   Consultants:  Oncology  Procedures:  None   Antibiotics:  Fluconazole 05/25/2012 -->  HPI/Subjective: Still with significant pain on swallowing.  Objective: Filed Vitals:   05/25/12 1215 05/25/12 2022 05/26/12 0530 05/26/12 0534  BP: 135/68 140/76 125/92   Pulse: 75 63 63   Temp: 98.2 F  (36.8 C) 98.2 F (36.8 C) 97.5 F (36.4 C)   TempSrc: Oral Oral Axillary   Resp: 18 18 18    Height: 5\' 5"  (1.651 m)     Weight: 56.291 kg (124 lb 1.6 oz)   56.019 kg (123 lb 8 oz)  SpO2: 99% 98% 98%     Intake/Output Summary (Last 24 hours) at 05/26/12 1512 Last data filed at 05/26/12 1006  Gross per 24 hour  Intake    240 ml  Output    850 ml  Net   -610 ml    Exam:   General:  Pt is alert, follows commands appropriately, not in acute distress  Cardiovascular: Regular rate and rhythm, S1/S2, no murmurs, no rubs, no gallops  Respiratory: Clear to auscultation bilaterally, no wheezing, no crackles, no rhonchi  Abdomen: Soft, non tender, non distended, bowel sounds present, no guarding  Extremities: No edema, pulses DP and PT palpable bilaterally  Neuro: Grossly nonfocal  Data Reviewed: Basic Metabolic Panel:  Recent Labs Lab 05/25/12 0929 05/25/12 1344 05/26/12 0349  NA 136  --  135  K 3.6  --  4.2  CL 103  --  101  CO2 23  --  26  GLUCOSE 127*  --  92  BUN 19.3  --  15  CREATININE 0.7 0.59 0.48*  CALCIUM 8.4  --  8.6   Liver Function Tests:  Recent Labs Lab 05/25/12 0929 05/26/12 0349  AST 28 21  ALT 74* 58*  ALKPHOS 55 52  BILITOT 0.92 0.9  PROT 5.6* 5.4*  ALBUMIN 2.7* 2.7*   No results found for this basename: LIPASE, AMYLASE,  in  the last 168 hours No results found for this basename: AMMONIA,  in the last 168 hours CBC:  Recent Labs Lab 05/25/12 0929 05/25/12 1344 05/26/12 0349  WBC 0.6* 0.5* 0.7*  NEUTROABS 0.3*  --   --   HGB 13.2 13.4 12.4  HCT 39.2 38.7 36.9  MCV 86.7 85.6 85.2  PLT 67* 68* 50*   Cardiac Enzymes: No results found for this basename: CKTOTAL, CKMB, CKMBINDEX, TROPONINI,  in the last 168 hours BNP: No components found with this basename: POCBNP,  CBG: No results found for this basename: GLUCAP,  in the last 168 hours  No results found for this or any previous visit (from the past 240 hour(s)).    Studies: X-ray Chest Pa And Lateral   05/25/2012   *RADIOLOGY REPORT*  Clinical Data: Chest pain, cough, metastatic lung cancer  CHEST - 2 VIEW  Comparison: 04/16/2012 Correlation:  CT chest 04/16/2012  Findings: Normal heart size and pulmonary vascularity. Right side aortic arch with atherosclerotic calcification. Left upper lobe scarring and volume loss with superior retraction of the left hilum and question hilar enlargement/adenopathy. Peripheral opacity in left upper lobe corresponds to the large mass with central cavitation identified on prior CT exam. Additional bilateral pulmonary nodules are identified including a large right right middle lobe mass 4.5 x 4.3 x 3.8 cm. No definite acute infiltrate, pleural effusion, or pneumothorax. Bones appear diffusely demineralized without obvious osseous metastasis.  IMPRESSION: Left upper lobe mass with upper lobe volume loss and probable left hilar adenopathy. Additional right middle lobe mass. Pulmonary nodules most likely representing pulmonary metastases. No acute infiltrate.   Original Report Authenticated By: Ulyses Southward, M.D.    Scheduled Meds: . acyclovir  5 mg/kg Intravenous Q8H  . dexamethasone  1 mg Oral Daily  . feeding supplement  237 mL Oral TID WC  . filgrastim (NEUPOGEN)  SQ  300 mcg Subcutaneous q1800  . folic acid  1 mg Oral Daily  . heparin  5,000 Units Subcutaneous Q8H  . morphine  30 mg Oral BID  . nystatin  5 mL Oral QID   Continuous Infusions: . sodium chloride 75 mL/hr at 05/25/12 1330

## 2012-05-26 NOTE — Telephone Encounter (Signed)
Message copied by Caren Griffins on Fri May 26, 2012  8:42 AM ------      Message from: Si Gaul      Created: Thu May 25, 2012 10:24 AM       Call patient with the result and arrange for Neupogen X 3 days ------

## 2012-05-26 NOTE — Evaluation (Signed)
Clinical/Bedside Swallow Evaluation Patient Details  Name: Isabella Carpenter MRN: [id number] Date of Birth: [birth year]  Today's Date: 05/26/2012 Time: 1500-     Past Medical History:  Past Medical History  Diagnosis Date  . Cancer   . Chicken pox   . GERD (gastroesophageal reflux disease)   . Hypertension   . High cholesterol   . Urine incontinence   . Kidney stone   . Lung cancer 05/08/2012   Past Surgical History:  Past Surgical History  Procedure Laterality Date  . Abdominal hysterectomy    . Axillary lymph node biopsy Left 04/18/2012    Procedure: AXILLARY LYMPH NODE BIOPSY;  Surgeon: Adolph Pollack, MD;  Location: [facility];  Service: General;  Laterality: Left;  left subclavian. need one hour   HPI:  67 year old female with past medical history of metastatic non-small cell lung cancer, adenocarcinoma (diagnosed in April 2014), receiving systemic chemotherapy last cycle given 05/18/2012 who presented to [facility] 05/25/2012 with progressive dysphagia and odynophagia.   Assessment / Plan / Recommendation Clinical Impression  Pt. with esophagitis, and c/o sore throat.  Ulcerations noted on tongue and pt. reports mouth is very painful as well.  Pt. reports she can not swallow solids at all.  No overt s/s noted with purees, but pt. does not want to be restricted to purees if possible.  Liquids appeared to be swallowed well, but then a delayed cough was observed intermittently, but is difficult to know if the cough was related to the swallow.  A MBS is recommended for objective evaluation of the swallow.    Aspiration Risk  Mild    Diet Recommendation Thin liquid (Full liquids)   Liquid Administration via: Cup;Straw Medication Administration: Whole meds with puree Supervision: Patient able to self feed;Intermittent supervision to cue for compensatory strategies Compensations: Slow rate;Small sips/bites Postural Changes and/or Swallow Maneuvers: Seated upright 90 degrees    Other   Recommendations Recommended Consults: MBS Oral Care Recommendations: Oral care QID   Follow Up Recommendations       Frequency and Duration        Pertinent Vitals/Pain "5" pain with swallow         Swallow Study Prior Functional Status       General HPI: 67 year old female with past medical history of metastatic non-small cell lung cancer, adenocarcinoma (diagnosed in April 2014), receiving systemic chemotherapy last cycle given 05/18/2012 who presented to [facility] 05/25/2012 with progressive dysphagia and odynophagia. Type of Study: Bedside swallow evaluation Previous Swallow Assessment: N/A Diet Prior to this Study: Regular;Thin liquids Temperature Spikes Noted: No Respiratory Status: Room air History of Recent Intubation: No Behavior/Cognition: Alert;Cooperative Oral Cavity - Dentition: Adequate natural dentition Self-Feeding Abilities: Able to feed self Patient Positioning: Upright in bed Baseline Vocal Quality: Aphonic;Hoarse;Low vocal intensity Volitional Cough: Congested Volitional Swallow: Unable to elicit    Oral/Motor/Sensory Function Overall Oral Motor/Sensory Function: Impaired Lingual ROM: Reduced right;Reduced left Lingual Symmetry: Within Functional Limits Lingual Strength: Reduced Lingual Sensation: Reduced (Several ulcers on tongue (painful))   Ice Chips Ice chips: Within functional limits Presentation: Spoon   Thin Liquid Thin Liquid: Impaired Presentation: Spoon;Cup;Straw Pharyngeal  Phase Impairments: Cough - Delayed    Nectar Thick Nectar Thick Liquid: Not tested   Honey Thick Honey Thick Liquid: Not tested   Puree Puree: Within functional limits Presentation: Spoon   Solid   GO    Solid: Not tested (Pt. refused)       Maryjo Rochester T 05/26/2012,3:47 PM

## 2012-05-26 NOTE — Telephone Encounter (Signed)
According to pt chart, she has been admitted due to her neutropenia, will forward to Dr Donnald Garre.  SLJ

## 2012-05-26 NOTE — Progress Notes (Signed)
ANTIBIOTIC CONSULT NOTE - INITIAL  Pharmacy Consult for IV acyclovir Indication: esophagitis  No Known Allergies  Patient Measurements: Height: 5\' 5"  (165.1 cm) Weight: 123 lb 8 oz (56.019 kg) IBW/kg (Calculated) : 57  Vital Signs: Temp: 97.5 F (36.4 C) (05/16 0530) Temp src: Axillary (05/16 0530) BP: 125/92 mmHg (05/16 0530) Pulse Rate: 63 (05/16 0530)  Labs:  Recent Labs  05/25/12 0929 05/25/12 1344 05/26/12 0349  WBC 0.6* 0.5* 0.7*  HGB 13.2 13.4 12.4  PLT 67* 68* 50*  CREATININE 0.7 0.59 0.48*   Estimated Creatinine Clearance: 62.8 ml/min (by C-G formula based on Cr of 0.48).  Microbiology: No results found for this or any previous visit (from the past 720 hour(s)).  Medical History: Past Medical History  Diagnosis Date  . Cancer   . Chicken pox   . GERD (gastroesophageal reflux disease)   . Hypertension   . High cholesterol   . Urine incontinence   . Kidney stone   . Lung cancer 05/08/2012    Medications:  Anti-infectives   Start     Dose/Rate Route Frequency Ordered Stop   05/26/12 1400  fluconazole (DIFLUCAN) IVPB 200 mg  Status:  Discontinued     200 mg 100 mL/hr over 60 Minutes Intravenous Every 24 hours 05/25/12 1333 05/26/12 1159   05/26/12 1400  acyclovir (ZOVIRAX) 280 mg in dextrose 5 % 100 mL IVPB     5 mg/kg  56 kg 105.6 mL/hr over 60 Minutes Intravenous 3 times per day 05/26/12 1210     05/26/12 1300  fluconazole (DIFLUCAN) IVPB 400 mg     400 mg 100 mL/hr over 120 Minutes Intravenous Every 24 hours 05/26/12 1159 05/27/12 1259   05/25/12 1400  fluconazole (DIFLUCAN) IVPB 400 mg     400 mg 100 mL/hr over 120 Minutes Intravenous Every 24 hours 05/25/12 1325 05/25/12 1731     Assessment: 66 yof w/ metastatic NSCLC s/p carbo/alimta 5/8 and whole brain irradiatin 4/15-4/23. Admitted 5/15 w/ severe dysphagia in setting of neutropenia. Fluconazole started 5/15 for possible disseminated candidiasis. Now to start IV acyclovir per pharmacy for  r/o HSV esophagitis.  Remains neutropenic, WBC 0.7, ANC 0.3  Afebrile  Scr wnl, crcl 29ml/min  Goal of Therapy:  Appropriate dose of acyclovir  Plan:  Acyclovir 5mg /kg q8h Follow labs, vitals and cultures Adjust dose as necessary  Gwen Her PharmD  [phone] 05/26/2012 12:16 PM

## 2012-05-27 ENCOUNTER — Inpatient Hospital Stay (HOSPITAL_COMMUNITY): Payer: Commercial Managed Care - PPO

## 2012-05-27 ENCOUNTER — Ambulatory Visit: Payer: Commercial Managed Care - PPO

## 2012-05-27 DIAGNOSIS — C50419 Malignant neoplasm of upper-outer quadrant of unspecified female breast: Secondary | ICD-10-CM

## 2012-05-27 DIAGNOSIS — D709 Neutropenia, unspecified: Secondary | ICD-10-CM

## 2012-05-27 DIAGNOSIS — E860 Dehydration: Secondary | ICD-10-CM

## 2012-05-27 DIAGNOSIS — K1370 Unspecified lesions of oral mucosa: Secondary | ICD-10-CM

## 2012-05-27 LAB — CBC
HCT: 33.1 % — ABNORMAL LOW (ref 36.0–46.0)
MCH: 28.4 pg (ref 26.0–34.0)
MCV: 85.5 fL (ref 78.0–100.0)
Platelets: 37 10*3/uL — ABNORMAL LOW (ref 150–400)
RBC: 3.87 MIL/uL (ref 3.87–5.11)
RDW: 15.2 % (ref 11.5–15.5)

## 2012-05-27 LAB — BASIC METABOLIC PANEL
BUN: 9 mg/dL (ref 6–23)
CO2: 28 mEq/L (ref 19–32)
Calcium: 8.2 mg/dL — ABNORMAL LOW (ref 8.4–10.5)
Creatinine, Ser: 0.52 mg/dL (ref 0.50–1.10)

## 2012-05-27 MED ORDER — POTASSIUM CHLORIDE CRYS ER 20 MEQ PO TBCR
40.0000 meq | EXTENDED_RELEASE_TABLET | Freq: Once | ORAL | Status: AC
  Administered 2012-05-27: 40 meq via ORAL
  Filled 2012-05-27: qty 2

## 2012-05-27 NOTE — Procedures (Signed)
Objective Swallowing Evaluation: Modified Barium Swallowing Study  Patient Details  Name: Isabella Carpenter MRN: [id number] Date of Birth: 02-10-1945  Today's Date: 05/27/2012 Time: 0865-7846 SLP Time Calculation (min): 45 min  Past Medical History:  Past Medical History  Diagnosis Date  . Cancer   . Chicken pox   . GERD (gastroesophageal reflux disease)   . Hypertension   . High cholesterol   . Urine incontinence   . Kidney stone   . Lung cancer 05/08/2012   Past Surgical History:  Past Surgical History  Procedure Laterality Date  . Abdominal hysterectomy    . Axillary lymph node biopsy Left 04/18/2012    Procedure: AXILLARY LYMPH NODE BIOPSY;  Surgeon: Adolph Pollack, MD;  Location: [facility];  Service: General;  Laterality: Left;  left subclavian. need one hour   HPI:  67 year old female with past medical history of metastatic non-small cell lung cancer, adenocarcinoma (diagnosed in April 2014), receiving systemic chemotherapy last cycle given 05/18/2012 who presented to [facility] 05/25/2012 with progressive dysphagia and odynophagia.  Bedside swallow assessment revealed s/s aspiration with MBS recommended.      Assessment / Plan / Recommendation Clinical Impression  Dysphagia Diagnosis: Moderate oral phase dysphagia;Moderate pharyngeal phase dysphagia;Severe pharyngeal phase dysphagia Clinical impression: Pt. exhibited moderate oral dysphagia with delayed oral manipulation and transit and prolonged mastication/manipulation with small piece cracker.  Pharyngeal phase includes motor impairments such as reduced tongue base retraction, decreased laryngeal elevation, decreased hyo-laryngeal anterior movement, poor epiglottic closure.  Deficits resulted in mod-max pharyngeal residue with aspiration (silent) of pyriform sinsus residue at rest and during second swallow in attempts to clear residue. Thin liquids were aspirated (cough x 1 out of multiple episodes) during and after swallow.  There was no  significant difference related to aspiration events between thin and nectar with nectar consistency resulting in greater pharyngeal residue.  Chin tuck posture was an ineffective strategy.  Puree texture resulted in only moderate residue (perhaps due to a more moist consistency).  Given pt.'s suspected poor prognosis with cancer and clinical findings during MBS recommend pt. continue thin liquids and upgrade to puree Dys 1 texture.  SLP will follow closely and if pt. experiences increased sensation with purees, SLP can downgrade to liquid diet.  Swallow precautions include sit upright, small sips/bites, swallow 2-3 times, volitional cough after every other bite/sip and continued oral care.       Treatment Recommendation  Therapy as outlined in treatment plan below    Diet Recommendation Dysphagia 1 (Puree);Thin liquid   Liquid Administration via: Cup Medication Administration: Whole meds with puree Supervision: Patient able to self feed;Intermittent supervision to cue for compensatory strategies Compensations: Slow rate;Small sips/bites Postural Changes and/or Swallow Maneuvers: Seated upright 90 degrees    Other  Recommendations Oral Care Recommendations: Oral care BID   Follow Up Recommendations  None    Frequency and Duration min 2x/week  2 weeks   Pertinent Vitals/Pain none    SLP Swallow Goals Patient will utilize recommended strategies during swallow to increase swallowing safety with: Moderate cueing   General HPI: 67 year old female with past medical history of metastatic non-small cell lung cancer, adenocarcinoma (diagnosed in April 2014), receiving systemic chemotherapy last cycle given 05/18/2012 who presented to [facility] 05/25/2012 with progressive dysphagia and odynophagia.  Bedside swallow assessment revealed s/s aspiration with MBS recommended.  Type of Study: Modified Barium Swallowing Study Reason for Referral: Objectively evaluate swallowing function Previous Swallow  Assessment: N/A Diet Prior to this Study:  (  full liquids) Temperature Spikes Noted: No Respiratory Status: Room air History of Recent Intubation: No Behavior/Cognition: Alert;Cooperative;Requires cueing Oral Cavity - Dentition: Adequate natural dentition Oral Motor / Sensory Function: Impaired - see Bedside swallow eval Patient Positioning: Upright in chair Baseline Vocal Quality: Low vocal intensity;Hoarse Anatomy: Within functional limits Pharyngeal Secretions: Not observed secondary MBS    Reason for Referral Objectively evaluate swallowing function   Oral Phase Oral Preparation/Oral Phase Oral Phase: Impaired Oral - Nectar Oral - Nectar Cup: Delayed oral transit (minimal) Oral - Solids Oral - Puree: Delayed oral transit;Weak lingual manipulation Oral - Regular: Delayed oral transit;Weak lingual manipulation;Impaired mastication;Reduced posterior propulsion   Pharyngeal Phase Pharyngeal Phase Pharyngeal Phase: Impaired Pharyngeal - Nectar Pharyngeal - Nectar Teaspoon: Pharyngeal residue - valleculae;Pharyngeal residue - pyriform sinuses;Reduced laryngeal elevation;Reduced tongue base retraction;Penetration/Aspiration during swallow (penetrate during 2nd swallow) Penetration/Aspiration details (nectar teaspoon): Material enters airway, CONTACTS cords and not ejected out Pharyngeal - Nectar Cup: Pharyngeal residue - pyriform sinuses;Pharyngeal residue - valleculae;Reduced tongue base retraction;Reduced laryngeal elevation;Penetration/Aspiration during swallow Penetration/Aspiration details (nectar cup): Material enters airway, remains ABOVE vocal cords and not ejected out Pharyngeal - Thin Pharyngeal - Thin Cup: Penetration/Aspiration during swallow;Reduced laryngeal elevation;Reduced airway/laryngeal closure;Pharyngeal residue - valleculae;Pharyngeal residue - pyriform sinuses;Reduced tongue base retraction;Reduced anterior laryngeal mobility;Penetration/Aspiration after swallow (4  immediate consecutive swallows to clear pharynx) Penetration/Aspiration details (thin cup): Material enters airway, remains ABOVE vocal cords then ejected out;Material enters airway, passes BELOW cords without attempt by patient to eject out (silent aspiration) Pharyngeal - Solids Pharyngeal - Puree: Pharyngeal residue - valleculae;Reduced tongue base retraction Pharyngeal - Regular: Pharyngeal residue - valleculae;Reduced tongue base retraction;Pharyngeal residue - pyriform sinuses;Reduced laryngeal elevation  Cervical Esophageal Phase    GO    Cervical Esophageal Phase Cervical Esophageal Phase: [facility]         Darrow Bussing.Ed [employer] [phone]  05/27/2012

## 2012-05-27 NOTE — Progress Notes (Signed)
DIAGNOSIS: Metastatic non-small cell lung cancer, adenocarcinoma diagnosed in April 2014. EGFR mutation as well as ALK gene translocation result is still pending.   PRIOR THERAPY: Status post whole brain irradiation under the care of Dr. Selina Cooley completed on 05/03/2012   CURRENT THERAPY: Systemic chemotherapy was carboplatin for AUC of 5 and Alimta 500 mg/M2 every 3 weeks First cycle on 05/18/2012.  Subjective: The patient is seen and examined today. She is still weak with several mouth sores. She eats small amount of food. She denied any fever or chills. No nausea or vomiting.   Objective: Vital signs in last 24 hours: Temp:  [98.2 F (36.8 C)-99.6 F (37.6 C)] 99.6 F (37.6 C) (05/17 0600) Pulse Rate:  [66-70] 66 (05/17 0600) Resp:  [18-20] 18 (05/17 0600) BP: (122-151)/(63-72) 142/63 mmHg (05/17 0600) SpO2:  [96 %-98 %] 96 % (05/17 0600)  Intake/Output from previous day: 05/16 0701 - 05/17 0700 In: 1215.6 [P.O.:360; I.V.:750; IV Piggyback:105.6] Out: 500 [Urine:500] Intake/Output this shift: Total I/O In: -  Out: 700 [Urine:700]  General appearance: alert, cooperative, fatigued and no distress Resp: clear to auscultation bilaterally Cardio: regular rate and rhythm, S1, S2 normal, no murmur, click, rub or gallop GI: soft, non-tender; bowel sounds normal; no masses,  no organomegaly Extremities: extremities normal, atraumatic, no cyanosis or edema  Lab Results:   Recent Labs  05/26/12 0349 05/27/12 0355  WBC 0.7* 0.6*  HGB 12.4 11.0*  HCT 36.9 33.1*  PLT 50* 37*   BMET  Recent Labs  05/26/12 0349 05/27/12 0355  NA 135 133*  K 4.2 3.2*  CL 101 99  CO2 26 28  GLUCOSE 92 79  BUN 15 9  CREATININE 0.48* 0.52  CALCIUM 8.6 8.2*    Studies/Results: X-ray Chest Pa And Lateral   05/25/2012   *RADIOLOGY REPORT*  Clinical Data: Chest pain, cough, metastatic lung cancer  CHEST - 2 VIEW  Comparison: 04/16/2012 Correlation:  CT chest 04/16/2012  Findings: Normal heart  size and pulmonary vascularity. Right side aortic arch with atherosclerotic calcification. Left upper lobe scarring and volume loss with superior retraction of the left hilum and question hilar enlargement/adenopathy. Peripheral opacity in left upper lobe corresponds to the large mass with central cavitation identified on prior CT exam. Additional bilateral pulmonary nodules are identified including a large right right middle lobe mass 4.5 x 4.3 x 3.8 cm. No definite acute infiltrate, pleural effusion, or pneumothorax. Bones appear diffusely demineralized without obvious osseous metastasis.  IMPRESSION: Left upper lobe mass with upper lobe volume loss and probable left hilar adenopathy. Additional right middle lobe mass. Pulmonary nodules most likely representing pulmonary metastases. No acute infiltrate.   Original Report Authenticated By: Ulyses Southward, M.D.    Medications: I have reviewed the patient's current medications.  Assessment/Plan: 1) Metastatic NSCLC, Adenocarcinoma: s/p whole brain XRT and first cycle of chemotherapy with Carboplatin and Alimta. 2) Mouth Sores: secondary to recent chemotherapy and XRT. Continue MMW, Diflucan and pain medications. 3) Neutropenia: Neupogen 300 mcg SQ qd. 4) Dehydration: continue IV hydration. Thank you for taking good Care of Ms. Hart Rochester. I will continue to follow up with you and assist in her management.   LOS: 2 days    Kamrin Sibley K. 05/27/2012

## 2012-05-27 NOTE — Progress Notes (Addendum)
[facility] PROGRESS NOTE  Isabella Carpenter ZOX:[id number] DOB: July 17, 1945 DOA: 05/25/2012 PCP: [name], DO  Brief narrative: 67 year old female with past medical history of metastatic non-small cell lung cancer, adenocarcinoma (diagnosed in April 2014), receiving systemic chemotherapy last cycle given 05/18/2012 who presented to [facility] 05/25/2012 with progressive dysphagia and odynophagia.   Assessment/Plan:   Principal Problem:  Dysphagia, Odynophagia  - likely candida esophagitis, mucositis  - continue fluconazole to 400 mg IV daily  - continue nystatin suspension QID  - continue magic mouthwash with lidocaine  - SLP evaluation - recommended thin liquids for now Active Problems:  Mouth ulcers  - obvious concern is for HSV ulcers  - continue acyclovir for now; if no significant improvement in next 1-2 days will d/c acyclovir  Non small cell lung cancer with metastasis  - management per oncology, next chemo 06/08/2012  - appreciate oncology following Neutropenia  - secondary to sequela of chemotherapy  - continue neupogen 300 mcg Thrombocytopenia, unspecified  - secondary to sequela of chemotherapy  - no signs of bleeding  - continue to monitor CBC  - d/c sub Q heparin and use SCD for DVT prophylaxis Anemia of chronic disease  - secondary to history of malignancy and sequela of chemotherapy  - continue to monitor CBC  Hypokalemia - repleted - follow up BMP in am Moderate protein calorie malnutrition  - secondary to poor oral intake  - po intake as tolerated  Code Status: full code  Family Communication: family at the bedside  Disposition Plan: home when stable   Manson Passey, MD  [facility]  Pager [phone]   Consultants:  Oncology Procedures:  None  Antibiotics:  Fluconazole 05/25/2012 -->   If 7PM-7AM, please contact night-coverage [email or web address] Password [email or web address] 05/27/2012, 1:20 PM   LOS: 2 days    HPI/Subjective: Reports only minimal improvement in dysphagia  so far but she is optimistic and hopefull it will get better.  Objective: Filed Vitals:   05/26/12 0534 05/26/12 1440 05/26/12 2200 05/27/12 0600  BP:  151/72 122/64 142/63  Pulse:  70 67 66  Temp:  99.2 F (37.3 C) 98.2 F (36.8 C) 99.6 F (37.6 C)  TempSrc:  Oral Oral Oral  Resp:  20 18 18   Height:      Weight: 56.019 kg (123 lb 8 oz)     SpO2:  98% 98% 96%    Intake/Output Summary (Last 24 hours) at 05/27/12 1320 Last data filed at 05/27/12 1200  Gross per 24 hour  Intake 1155.6 ml  Output   1150 ml  Net    5.6 ml    Exam:   General:  Pt is alert, follows commands appropriately, not in acute distress  Cardiovascular: Regular rate and rhythm, S1/S2, no murmurs, no rubs, no gallops  Respiratory: Clear to auscultation bilaterally, no wheezing, no crackles, no rhonchi  Abdomen: Soft, non tender, non distended, bowel sounds present, no guarding  Extremities: No edema, pulses DP and PT palpable bilaterally  Neuro: Grossly nonfocal  Data Reviewed: Basic Metabolic Panel:  Recent Labs Lab 05/25/12 0929 05/25/12 1344 05/26/12 0349 05/27/12 0355  NA 136  --  135 133*  K 3.6  --  4.2 3.2*  CL 103  --  101 99  CO2 23  --  26 28  GLUCOSE 127*  --  92 79  BUN 19.3  --  15 9  CREATININE 0.7 0.59 0.48* 0.52  CALCIUM 8.4  --  8.6 8.2*   Liver  Function Tests:  Recent Labs Lab 05/25/12 0929 05/26/12 0349  AST 28 21  ALT 74* 58*  ALKPHOS 55 52  BILITOT 0.92 0.9  PROT 5.6* 5.4*  ALBUMIN 2.7* 2.7*   No results found for this basename: LIPASE, AMYLASE,  in the last 168 hours No results found for this basename: AMMONIA,  in the last 168 hours CBC:  Recent Labs Lab 05/25/12 0929 05/25/12 1344 05/26/12 0349 05/27/12 0355  WBC 0.6* 0.5* 0.7* 0.6*  NEUTROABS 0.3*  --   --   --   HGB 13.2 13.4 12.4 11.0*  HCT 39.2 38.7 36.9 33.1*  MCV 86.7 85.6 85.2 85.5  PLT 67* 68* 50* 37*   Cardiac Enzymes: No results found for this basename: CKTOTAL, CKMB, CKMBINDEX,  TROPONINI,  in the last 168 hours BNP: No components found with this basename: POCBNP,  CBG: No results found for this basename: GLUCAP,  in the last 168 hours  No results found for this or any previous visit (from the past 240 hour(s)).   Studies: X-ray Chest Pa And Lateral   05/25/2012   *RADIOLOGY REPORT*  Clinical Data: Chest pain, cough, metastatic lung cancer  CHEST - 2 VIEW  Comparison: 04/16/2012 Correlation:  CT chest 04/16/2012  Findings: Normal heart size and pulmonary vascularity. Right side aortic arch with atherosclerotic calcification. Left upper lobe scarring and volume loss with superior retraction of the left hilum and question hilar enlargement/adenopathy. Peripheral opacity in left upper lobe corresponds to the large mass with central cavitation identified on prior CT exam. Additional bilateral pulmonary nodules are identified including a large right right middle lobe mass 4.5 x 4.3 x 3.8 cm. No definite acute infiltrate, pleural effusion, or pneumothorax. Bones appear diffusely demineralized without obvious osseous metastasis.  IMPRESSION: Left upper lobe mass with upper lobe volume loss and probable left hilar adenopathy. Additional right middle lobe mass. Pulmonary nodules most likely representing pulmonary metastases. No acute infiltrate.   Original Report Authenticated By: Ulyses Southward, M.D.    Scheduled Meds: . acyclovir  5 mg/kg Intravenous Q8H  . dexamethasone  1 mg Oral Daily  . feeding supplement  237 mL Oral TID WC  . filgrastim (NEUPOGEN)  SQ  300 mcg Subcutaneous q1800  . folic acid  1 mg Oral Daily  . morphine  30 mg Oral BID  . nystatin  5 mL Oral QID   Continuous Infusions: . sodium chloride 1,000 mL (05/27/12 1312)

## 2012-05-28 LAB — CBC
HCT: 34.1 % — ABNORMAL LOW (ref 36.0–46.0)
MCHC: 34.9 g/dL (ref 30.0–36.0)
MCV: 83.8 fL (ref 78.0–100.0)
Platelets: 25 10*3/uL — CL (ref 150–400)
RDW: 14.8 % (ref 11.5–15.5)
WBC: 0.5 10*3/uL — CL (ref 4.0–10.5)

## 2012-05-28 LAB — BASIC METABOLIC PANEL
BUN: 5 mg/dL — ABNORMAL LOW (ref 6–23)
Calcium: 8.2 mg/dL — ABNORMAL LOW (ref 8.4–10.5)
Chloride: 98 mEq/L (ref 96–112)
Creatinine, Ser: 0.46 mg/dL — ABNORMAL LOW (ref 0.50–1.10)
GFR calc Af Amer: >90 mL/min (ref >90)
GFR calc non Af Amer: >90 mL/min (ref >90)

## 2012-05-28 MED ORDER — FLUCONAZOLE IN SODIUM CHLORIDE 400-0.9 MG/200ML-% IV SOLN
400.0000 mg | INTRAVENOUS | Status: DC
  Administered 2012-05-28 – 2012-05-30 (×3): 400 mg via INTRAVENOUS
  Filled 2012-05-28 (×3): qty 200

## 2012-05-28 NOTE — Progress Notes (Signed)
[facility] PROGRESS NOTE  Isabella Carpenter JYN:[id number] DOB: 09-22-1945 DOA: 05/25/2012 PCP: [name], DO  Brief narrative: 67 year old female with past medical history of metastatic non-small cell lung cancer, adenocarcinoma (diagnosed in April 2014), receiving systemic chemotherapy last cycle given 05/18/2012 who presented to [facility] 05/25/2012 with progressive dysphagia and odynophagia.   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 - continue acyclovir for now and since mouth ulcers have almost 80% cleared I will still continue acyclovir Non small cell lung cancer with metastasis  - management per oncology, next chemo 06/08/2012  - appreciate oncology following  Neutropenia  - secondary to sequela of chemotherapy  - continue neupogen 300 mcg  Thrombocytopenia, unspecified  - secondary to sequela of chemotherapy  - no signs of bleeding  - continue to monitor CBC  - d/c sub Q heparin and use SCD for DVT prophylaxis  Anemia of chronic disease  - secondary to history of malignancy and sequela of chemotherapy  - follow up CBC today  Hypokalemia  - repleted  - follow up BMP today Moderate protein calorie malnutrition  - secondary to poor oral intake  - po intake as tolerated thin liquids  Code Status: full code  Family Communication: family at the bedside  Disposition Plan: home when stable   Manson Passey, MD  [facility]  Pager [phone]   Consultants:  Oncology Other consultants:  SLP Procedures:  None  Antibiotics:  Fluconazole 05/25/2012 --> Acyclovir 05/25/2012 -->  If 7PM-7AM, please contact night-coverage [email or web address] Password [email or web address] 05/28/2012, 10:28 AM   LOS: 3 days    HPI/Subjective: Patient feels better today. Reports swallowing improving.  Objective: Filed Vitals:   05/27/12 0600 05/27/12 1558 05/27/12 2049 05/28/12 0511  BP: 142/63 137/72 140/61 138/62  Pulse: 66 69 62 62  Temp: 99.6 F (37.6 C) 98.2 F (36.8 C) 98.5 F (36.9 C) 98.6 F (37 C)  TempSrc: Oral Oral Oral Oral  Resp: 18 18 18 18   Height:      Weight:    56.881 kg (125 lb 6.4 oz)  SpO2: 96% 96% 97% 97%    Intake/Output Summary (Last 24 hours) at 05/28/12 1028 Last data filed at 05/28/12 0430  Gross per 24 hour  Intake     60 ml  Output    900 ml  Net   -840 ml    Exam:   General:  Pt is alert, follows commands appropriately, not in acute distress  Cardiovascular: Regular rate and rhythm, S1/S2, no murmurs, no rubs, no gallops  Respiratory: Clear to auscultation bilaterally, no wheezing, no crackles, no rhonchi  Abdomen: Soft, non tender, non distended, bowel sounds present, no guarding  Extremities: No edema, pulses DP and PT palpable bilaterally  Neuro: Grossly nonfocal  Data Reviewed: Basic Metabolic Panel:  Recent Labs Lab 05/25/12 0929 05/25/12 1344 05/26/12 0349 05/27/12 0355  NA 136  --  135 133*  K 3.6  --  4.2 3.2*  CL 103  --  101 99  CO2 23  --  26 28  GLUCOSE 127*  --  92 79  BUN 19.3  --  15 9  CREATININE 0.7 0.59 0.48* 0.52  CALCIUM 8.4  --  8.6  8.2*   Liver Function Tests:  Recent Labs Lab 05/25/12 0929 05/26/12 0349  AST 28 21  ALT 74* 58*  ALKPHOS 55 52  BILITOT 0.92 0.9  PROT 5.6* 5.4*  ALBUMIN 2.7* 2.7*   No results found for this basename: LIPASE, AMYLASE,  in the last 168 hours No results found for this basename: AMMONIA,  in the last 168 hours CBC:  Recent Labs Lab 05/25/12 0929 05/25/12 1344 05/26/12 0349 05/27/12 0355  WBC 0.6* 0.5* 0.7* 0.6*  NEUTROABS 0.3*  --   --   --   HGB 13.2 13.4 12.4 11.0*  HCT 39.2 38.7 36.9 33.1*  MCV 86.7 85.6 85.2 85.5  PLT 67* 68* 50* 37*   Cardiac Enzymes: No results  found for this basename: CKTOTAL, CKMB, CKMBINDEX, TROPONINI,  in the last 168 hours BNP: No components found with this basename: POCBNP,  CBG: No results found for this basename: GLUCAP,  in the last 168 hours  No results found for this or any previous visit (from the past 240 hour(s)).   Studies: Dg Swallowing Func-speech Pathology  05/27/2012   Breck Coons [place], CCC-SLP     05/27/2012  3:52 PM Objective Swallowing Evaluation: Modified Barium Swallowing Study   Patient Details  Name: Isabella Carpenter MRN: [id number] Date of Birth: 1945/11/20  Today's Date: 05/27/2012 Time: 4098-1191 SLP Time Calculation (min): 45 min  Past Medical History:  Past Medical History  Diagnosis Date  . Cancer   . Chicken pox   . GERD (gastroesophageal reflux disease)   . Hypertension   . High cholesterol   . Urine incontinence   . Kidney stone   . Lung cancer 05/08/2012   Past Surgical History:  Past Surgical History  Procedure Laterality Date  . Abdominal hysterectomy    . Axillary lymph node biopsy Left 04/18/2012    Procedure: AXILLARY LYMPH NODE BIOPSY;  Surgeon: Adolph Pollack, MD;  Location: [facility] ORS;  Service: General;   Laterality: Left;  left subclavian. need one hour   HPI:  67 year old female with past medical history of metastatic  non-small cell lung cancer, adenocarcinoma (diagnosed in April  2014), receiving systemic chemotherapy last cycle given 05/18/2012  who presented to [facility] 05/25/2012 with progressive dysphagia and  odynophagia.  Bedside swallow assessment revealed s/s aspiration  with MBS recommended.      Assessment / Plan / Recommendation Clinical Impression  Dysphagia Diagnosis: Moderate oral phase dysphagia;Moderate  pharyngeal phase dysphagia;Severe pharyngeal phase dysphagia Clinical impression: Pt. exhibited moderate oral dysphagia with  delayed oral manipulation and transit and prolonged  mastication/manipulation with small piece cracker.  Pharyngeal  phase includes motor impairments such as reduced tongue  base  retraction, decreased laryngeal elevation, decreased  hyo-laryngeal anterior movement, poor epiglottic closure.   Deficits resulted in mod-max pharyngeal residue with aspiration  (silent) of pyriform sinsus residue at rest and during second  swallow in attempts to clear residue. Thin liquids were aspirated  (cough x 1 out of multiple episodes) during and after swallow.   There was no significant difference related to aspiration events  between thin and nectar with nectar consistency resulting in  greater pharyngeal residue.  Chin tuck posture was an ineffective  strategy.  Puree texture resulted in only moderate residue  (perhaps due to a more moist consistency).  Given pt.'s suspected  poor prognosis with cancer and clinical findings during MBS  recommend pt. continue thin liquids and upgrade to puree Dys 1  texture.  SLP  will follow closely and if pt. experiences  increased sensation with purees, SLP can downgrade to liquid  diet.  Swallow precautions include sit upright, small sips/bites,  swallow 2-3 times, volitional cough after every other bite/sip  and continued oral care.       Treatment Recommendation  Therapy as outlined in treatment plan below    Diet Recommendation Dysphagia 1 (Puree);Thin liquid   Liquid Administration via: Cup Medication Administration: Whole meds with puree Supervision: Patient able to self feed;Intermittent supervision  to cue for compensatory strategies Compensations: Slow rate;Small sips/bites Postural Changes and/or Swallow Maneuvers: Seated upright 90  degrees    Other  Recommendations Oral Care Recommendations: Oral care BID   Follow Up Recommendations  None    Frequency and Duration min 2x/week  2 weeks   Pertinent Vitals/Pain none    SLP Swallow Goals Patient will utilize recommended strategies during swallow to  increase swallowing safety with: Moderate cueing   General HPI: 67 year old female with past medical history of  metastatic non-small cell lung cancer,  adenocarcinoma (diagnosed  in April 2014), receiving systemic chemotherapy last cycle given  05/18/2012 who presented to [facility] 05/25/2012 with progressive dysphagia  and odynophagia.  Bedside swallow assessment revealed s/s  aspiration with MBS recommended.  Type of Study: Modified Barium Swallowing Study Reason for Referral: Objectively evaluate swallowing function Previous Swallow Assessment: N/A Diet Prior to this Study:  (full liquids) Temperature Spikes Noted: No Respiratory Status: Room air History of Recent Intubation: No Behavior/Cognition: Alert;Cooperative;Requires cueing Oral Cavity - Dentition: Adequate natural dentition Oral Motor / Sensory Function: Impaired - see Bedside swallow  eval Patient Positioning: Upright in chair Baseline Vocal Quality: Low vocal intensity;Hoarse Anatomy: Within functional limits Pharyngeal Secretions: Not observed secondary MBS    Reason for Referral Objectively evaluate swallowing function   Oral Phase Oral Preparation/Oral Phase Oral Phase: Impaired Oral - Nectar Oral - Nectar Cup: Delayed oral transit (minimal) Oral - Solids Oral - Puree: Delayed oral transit;Weak lingual manipulation Oral - Regular: Delayed oral transit;Weak lingual  manipulation;Impaired mastication;Reduced posterior propulsion   Pharyngeal Phase Pharyngeal Phase Pharyngeal Phase: Impaired Pharyngeal - Nectar Pharyngeal - Nectar Teaspoon: Pharyngeal residue -  valleculae;Pharyngeal residue - pyriform sinuses;Reduced  laryngeal elevation;Reduced tongue base  retraction;Penetration/Aspiration during swallow (penetrate  during 2nd swallow) Penetration/Aspiration details (nectar teaspoon): Material enters  airway, CONTACTS cords and not ejected out Pharyngeal - Nectar Cup: Pharyngeal residue - pyriform  sinuses;Pharyngeal residue - valleculae;Reduced tongue base  retraction;Reduced laryngeal elevation;Penetration/Aspiration  during swallow Penetration/Aspiration details (nectar cup): Material enters  airway,  remains ABOVE vocal cords and not ejected out Pharyngeal - Thin Pharyngeal - Thin Cup: Penetration/Aspiration during  swallow;Reduced laryngeal elevation;Reduced airway/laryngeal  closure;Pharyngeal residue - valleculae;Pharyngeal residue -  pyriform sinuses;Reduced tongue base retraction;Reduced anterior  laryngeal mobility;Penetration/Aspiration after swallow (4  immediate consecutive swallows to clear pharynx) Penetration/Aspiration details (thin cup): Material enters  airway, remains ABOVE vocal cords then ejected out;Material  enters airway, passes BELOW cords without attempt by patient to  eject out (silent aspiration) Pharyngeal - Solids Pharyngeal - Puree: Pharyngeal residue - valleculae;Reduced  tongue base retraction Pharyngeal - Regular: Pharyngeal residue - valleculae;Reduced  tongue base retraction;Pharyngeal residue - pyriform  sinuses;Reduced laryngeal elevation  Cervical Esophageal Phase    GO    Cervical Esophageal Phase Cervical Esophageal Phase: [facility]         Darrow Bussing.Ed CCC-SLP Pager [phone]  05/27/2012    Scheduled Meds: . acyclovir  5 mg/kg Intravenous Q8H  .  dexamethasone  1 mg Oral Daily  . feeding supplement  237 mL Oral TID WC  . filgrastim (NEUPOGEN)  SQ  300 mcg Subcutaneous q1800  . fluconazole (DIFLUCAN) IV  400 mg Intravenous Q24H  . folic acid  1 mg Oral Daily  . morphine  30 mg Oral BID  . nystatin  5 mL Oral QID   Continuous Infusions: . sodium chloride 75 mL/hr at 05/28/12 0440

## 2012-05-28 NOTE — Progress Notes (Signed)
ANTIBIOTIC CONSULT NOTE - FOLLOW UP  Pharmacy Consult for Fluconazole, Acyclovir Indication: r/o hsv esophagitis, esophageal candida   No Known Allergies  Patient Measurements: Height: 5\' 5"  (165.1 cm) Weight: 125 lb 6.4 oz (56.881 kg) IBW/kg (Calculated) : 57  Vital Signs: Temp: 98.6 F (37 C) (05/18 0511) Temp src: Oral (05/18 0511) BP: 138/62 mmHg (05/18 0511) Pulse Rate: 62 (05/18 0511) Intake/Output from previous day: 05/17 0701 - 05/18 0700 In: 180 [P.O.:180] Out: 1850 [Urine:1850] Intake/Output from this shift:    Labs:  Recent Labs  05/25/12 1344 05/26/12 0349 05/27/12 0355  WBC 0.5* 0.7* 0.6*  HGB 13.4 12.4 11.0*  PLT 68* 50* 37*  CREATININE 0.59 0.48* 0.52   Estimated Creatinine Clearance: 63.8 ml/min (by C-G formula based on Cr of 0.52). No results found for this basename: Rolm Gala, VANCORANDOM, GENTTROUGH, GENTPEAK, GENTRANDOM, TOBRATROUGH, TOBRAPEAK, TOBRARND, AMIKACINPEAK, AMIKACINTROU, AMIKACIN,  in the last 72 hours    Assessment: 67yo F w/ recent diagnosis of NSCLC w/ brain mets, s/p recent XRT and chemo on 05/18/12 presented 5/15 with neutropenia, weakness, and severe dysphagia.   Today is D#4 IV Fluconazole and D#3 IV Acyclovir  r/o hsv esophagitis, esophageal candida.  Patient is afebrile, WBC 0.6 (ANC 0.3 on 5/15) - Filgrastim started 5/16.  Scr 0.52 for CG CrCl of 63 ml/min and normalized CrCl of 81 ml/min ussing Scr of 0.8.   No cultures data available  Pharmacy intended to give patient Fluconazole 400 mg IV x 1 then 200 mg IV q24h but after the first dose, changed maintenance Fluconazole to 400 mg IV q24h but accidentally place stop time of after 1 dose so patient missed Fluconazole dose on 5/17.  Per communication with Dr. Elisabeth Pigeon this morning, MD would like to continue Fluconazole at high dose of 400 mg IV q24h for now.   MD wrote would d/c acyclovir in 1-2 days if no significant improvement.    Pt continues on Decadron 1mg   daily per Rad-Onc (per Dr. Mahala Menghini notes on 5/15 - plan stopping Decadron in 3-4 days)    Plan:   Continue Fluconazole at 400 mg IV q24h as desired by MD  Continue Acyclovir at 280 mg IV three times daily (5mg /kg per dose)  MD - please re-assess length of therapy for Decadron  Geoffry Paradise, PharmD, BCPS Pager: [phone] 7:40 AM Pharmacy #: [id number]

## 2012-05-29 ENCOUNTER — Other Ambulatory Visit: Payer: Commercial Managed Care - PPO | Admitting: Lab

## 2012-05-29 ENCOUNTER — Ambulatory Visit: Payer: Commercial Managed Care - PPO | Admitting: Internal Medicine

## 2012-05-29 LAB — BASIC METABOLIC PANEL
BUN: 5 mg/dL — ABNORMAL LOW (ref 6–23)
Chloride: 97 mEq/L (ref 96–112)
GFR calc Af Amer: >90 mL/min (ref >90)
GFR calc non Af Amer: >90 mL/min (ref >90)
Potassium: 3.4 mEq/L — ABNORMAL LOW (ref 3.5–5.1)
Sodium: 131 mEq/L — ABNORMAL LOW (ref 135–145)

## 2012-05-29 LAB — CBC
HCT: 30.4 % — ABNORMAL LOW (ref 36.0–46.0)
MCHC: 34.5 g/dL (ref 30.0–36.0)
Platelets: 20 10*3/uL — CL (ref 150–400)
RDW: 14.7 % (ref 11.5–15.5)
WBC: 0.9 10*3/uL — CL (ref 4.0–10.5)

## 2012-05-29 MED ORDER — POTASSIUM CHLORIDE CRYS ER 20 MEQ PO TBCR
40.0000 meq | EXTENDED_RELEASE_TABLET | Freq: Once | ORAL | Status: AC
  Administered 2012-05-29: 40 meq via ORAL
  Filled 2012-05-29: qty 2

## 2012-05-29 NOTE — Care Management (Signed)
Cm spoke with patient at bedside concerning discharge planning. Pt states currently living alone but adult daughter is making arrangements to move in with pt to assist in home care. Per pt adult daughter provides tx to MD appts. Pt states no HH services required at this time. Cm provided pt with Cm contact information if other needs identified prior to discharge. No needs identified at this time.   Roxy Manns Danetta Prom,RN,BSN [phone]

## 2012-05-29 NOTE — Progress Notes (Signed)
[facility] PROGRESS NOTE  Isabella Carpenter ZOX:[id number] DOB: [birth year] DOA: 05/25/2012 PCP: [name], DO  Brief narrative: 67 year old female with past medical history of metastatic non-small cell lung cancer, adenocarcinoma (diagnosed in April 2014), receiving systemic chemotherapy last cycle given 05/18/2012 who presented to [facility] 05/25/2012 with progressive dysphagia and odynophagia. We have initiated the following regimen: Fluconazole, nystatin suspension, Magic mouthwash with lidocaine in addition to acyclovir. Patient feels that her swallowing is significantly improving. We will advance diet today as tolerated. We will continue to monitor CBC daily as white blood cell count and platelet  count is persistently low.  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 Mouth ulcers have resolved. Non small cell lung cancer with metastasis  - management per oncology, next chemo 06/08/2012  - appreciate oncology following  Neutropenia  - secondary to sequela of chemotherapy  - continue neupogen 300 mcg  Thrombocytopenia, unspecified  - secondary to sequela of chemotherapy  - no signs of bleeding  - Platelet count 20 today Anemia of chronic disease  - secondary to history of malignancy and sequela of chemotherapy  - Hemoglobin stable in range 10.5-11.9 Hypokalemia  - repleted  - follow up BMP in am Moderate protein calorie malnutrition  - secondary to poor oral intake  - po intake as tolerated thin liquids   Code Status: full code  Family Communication: family at the bedside  Disposition Plan: home when stable   Manson Passey, MD  [facility]  Pager [phone]   Consultants:  Oncology Other consultants:    SLP Procedures:  None  Antibiotics:  Fluconazole 05/25/2012 -->  Acyclovir 05/25/2012 -->   If 7PM-7AM, please contact night-coverage [email or web address] Password [email or web address] 05/29/2012, 10:02 AM   LOS: 4 days    HPI/Subjective: No acute overnight events. Swallowing is improving.  Objective: Filed Vitals:   05/28/12 0511 05/28/12 1425 05/28/12 2119 05/29/12 0510  BP: 138/62 138/74 150/70 137/63  Pulse: 62 64 63 60  Temp: 98.6 F (37 C) 97.9 F (36.6 C) 98.8 F (37.1 C) 98.5 F (36.9 C)  TempSrc: Oral Oral Oral Oral  Resp: 18 16 16 18   Height:      Weight: 56.881 kg (125 lb 6.4 oz)   56.972 kg (125 lb 9.6 oz)  SpO2: 97% 97% 97% 99%    Intake/Output Summary (Last 24 hours) at 05/29/12 1002 Last data filed at 05/29/12 0440  Gross per 24 hour  Intake 1132.35 ml  Output   1150 ml  Net -17.65 ml    Exam:   General:  Pt is alert, follows commands appropriately, not in acute distress  Cardiovascular: Regular rate and rhythm, S1/S2, no murmurs, no rubs, no gallops  Respiratory: Clear to auscultation bilaterally, no wheezing, no crackles, no rhonchi  Abdomen: Soft, non tender, non distended, bowel sounds present, no guarding  Extremities: No edema, pulses DP and PT palpable bilaterally  Neuro: Grossly nonfocal  Data Reviewed: Basic Metabolic Panel:  Recent Labs Lab 05/25/12 0929 05/25/12 1344 05/26/12 0349 05/27/12 0355 05/28/12 1143 05/29/12 0403  NA 136  --  135 133* 133* 131*  K 3.6  --  4.2 3.2* 3.4* 3.4*  CL 103  --  101 99 98 97  CO2 23  --  26 28 26 26   GLUCOSE 127*  --  92 79 89 80  BUN 19.3  --  15 9 5* 5*  CREATININE 0.7 0.59 0.48* 0.52 0.46* 0.42*  CALCIUM 8.4  --  8.6 8.2* 8.2* 8.0*   Liver Function Tests:  Recent Labs Lab 05/25/12 0929 05/26/12 0349  AST 28 21  ALT 74* 58*  ALKPHOS 55 52  BILITOT 0.92 0.9  PROT 5.6* 5.4*  ALBUMIN 2.7* 2.7*   CBC:  Recent Labs Lab 05/25/12 0929 05/25/12 1344 05/26/12 0349 05/27/12 0355  05/28/12 1143 05/29/12 0403  WBC 0.6* 0.5* 0.7* 0.6* 0.5* 0.9*  HGB 13.2 13.4 12.4 11.0* 11.9* 10.5*  HCT 39.2 38.7 36.9 33.1* 34.1* 30.4*  MCV 86.7 85.6 85.2 85.5 83.8 83.5  PLT 67* 68* 50* 37* 25* 20*     Studies: Dg Swallowing Func-speech Pathology   Scheduled Meds: . acyclovir  5 mg/kg Intravenous Q8H  . dexamethasone  1 mg Oral Daily  . feeding supplement  237 mL Oral TID WC  . filgrastim (NEUPOGEN)   300 mcg Subcutaneous q1800  . fluconazole (DIFLUCAN)  400 mg Intravenous Q24H  . folic acid  1 mg Oral Daily  . morphine  30 mg Oral BID  . nystatin  5 mL Oral QID  . potassium chloride  40 mEq Oral Once   Continuous Infusions: . sodium chloride 75 mL/hr at 05/29/12 0440

## 2012-05-29 NOTE — Progress Notes (Signed)
Speech Language Pathology Dysphagia Treatment Patient Details Name: Isabella Carpenter MRN: [id number] DOB: 01/17/1945 Today's Date: 05/29/2012 Time: 0865-7846 SLP Time Calculation (min): 30 min  Assessment / Plan / Recommendation Clinical Impression  Pt seen for skilled dysphagia therapy after MBS completed on Saturday 05/27/12.  Pt reports much improved swallowing today - returning to baseline.    SLP reviewed results of MBS, purpose of compensatory strategies, aspiration precautions, and tips to manage mucositis/esophagitis.  Pt did not recall if she sensed pharyngeal stasis on MBS and it was not documented on test, therefore reiterated to pt importance of compliance with precautions for airway protection.  Observed pt self feeding cracker, icecream and water - no s/s of aspiration and pt uses great caution with intake.  Rec advance to soft/ground meats/thin with precautions.  SLP to follow.     Diet Recommendation  Initiate / Change Diet: Dysphagia 3 (mechanical soft);Thin liquid    SLP Plan Continue with current plan of care   Pertinent Vitals/Pain Afebrile, decreased   Swallowing Goals  SLP Swallowing Goals Patient will utilize recommended strategies during swallow to increase swallowing safety with: Moderate cueing Swallow Study Goal #2 - Progress: Progressing toward goal  General Temperature Spikes Noted: No Respiratory Status: Room air Behavior/Cognition: Alert;Cooperative Oral Cavity - Dentition: Adequate natural dentition Patient Positioning: Upright in bed  Oral Cavity - Oral Hygiene     Dysphagia Treatment Treatment focused on: Skilled observation of diet tolerance;Patient/family/caregiver education;Upgraded PO texture trials;Utilization of compensatory strategies Family/Caregiver Educated: pt Treatment Methods/Modalities: Skilled observation Patient observed directly with PO's: Yes Type of PO's observed: Regular;Thin liquids;Dysphagia 1 (puree) Feeding: Able to feed  self Liquids provided via: Cup;Straw Oral Phase Signs & Symptoms: Prolonged mastication;Prolonged bolus formation (pt uses caution with eating, maximizing airway protection) Type of cueing: Verbal Amount of cueing: Minimal   GO     Donavan Burnet, MS [facility] SLP [phone]

## 2012-05-30 DIAGNOSIS — B370 Candidal stomatitis: Secondary | ICD-10-CM

## 2012-05-30 DIAGNOSIS — D696 Thrombocytopenia, unspecified: Secondary | ICD-10-CM

## 2012-05-30 DIAGNOSIS — C801 Malignant (primary) neoplasm, unspecified: Secondary | ICD-10-CM

## 2012-05-30 LAB — ABO/RH: ABO/RH(D): A POS

## 2012-05-30 LAB — CBC
HCT: 31.5 % — ABNORMAL LOW (ref 36.0–46.0)
Hemoglobin: 10.6 g/dL — ABNORMAL LOW (ref 12.0–15.0)
MCHC: 33.7 g/dL (ref 30.0–36.0)
RBC: 3.78 MIL/uL — ABNORMAL LOW (ref 3.87–5.11)
WBC: 2.3 10*3/uL — ABNORMAL LOW (ref 4.0–10.5)

## 2012-05-30 MED ORDER — ACYCLOVIR 400 MG PO TABS: 400.0000 mg | ORAL_TABLET | Freq: Two times a day (BID) | ORAL | Status: DC

## 2012-05-30 MED ORDER — PROCHLORPERAZINE MALEATE 10 MG PO TABS: 10.0000 mg | ORAL_TABLET | Freq: Four times a day (QID) | ORAL | Status: DC | PRN

## 2012-05-30 MED ORDER — PROMETHAZINE HCL 25 MG PO TABS: 25.0000 mg | ORAL_TABLET | Freq: Four times a day (QID) | ORAL | Status: DC | PRN

## 2012-05-30 MED ORDER — NYSTATIN 100000 UNIT/ML MT SUSP: 5.0000 mL | Freq: Four times a day (QID) | OROMUCOSAL | Status: DC

## 2012-05-30 MED ORDER — FLUCONAZOLE 100 MG PO TABS: 200.0000 mg | ORAL_TABLET | Freq: Every day | ORAL | Status: DC

## 2012-05-30 MED ORDER — MAGIC MOUTHWASH W/LIDOCAINE: 5.0000 mL | Freq: Four times a day (QID) | ORAL | Status: DC | PRN

## 2012-05-30 MED ORDER — OXYCODONE-ACETAMINOPHEN 5-325 MG PO TABS: 1.0000 | ORAL_TABLET | Freq: Four times a day (QID) | ORAL | Status: DC | PRN

## 2012-05-30 MED ORDER — MORPHINE SULFATE ER 30 MG PO TBCR: 30.0000 mg | EXTENDED_RELEASE_TABLET | Freq: Two times a day (BID) | ORAL | Status: DC

## 2012-05-30 NOTE — Progress Notes (Signed)
Subjective: She is feeling better today. No fever or chills. No bleeding. Leukocyte count improved. She wants to go home.  Objective: Vital signs in last 24 hours: Temp:  [97.9 F (36.6 C)-98.4 F (36.9 C)] 98.4 F (36.9 C) (05/20 0520) Pulse Rate:  [57-69] 69 (05/20 0520) Resp:  [14-16] 14 (05/20 0520) BP: (151-163)/(64-70) 163/64 mmHg (05/20 0520) SpO2:  [97 %-98 %] 98 % (05/20 0520) Weight:  [126 lb 4.8 oz (57.289 kg)] 126 lb 4.8 oz (57.289 kg) (05/20 0520)  Intake/Output from previous day: 05/19 0701 - 05/20 0700 In: 2752.4 [P.O.:600; I.V.:1846.8; IV Piggyback:305.6] Out: 1800 [Urine:1800] Intake/Output this shift:    General appearance: alert, cooperative, fatigued and no distress Resp: clear to auscultation bilaterally Cardio: regular rate and rhythm, S1, S2 normal, no murmur, click, rub or gallop GI: soft, non-tender; bowel sounds normal; no masses,  no organomegaly Extremities: extremities normal, atraumatic, no cyanosis or edema  Lab Results:   Recent Labs  05/29/12 0403 05/30/12 0340  WBC 0.9* 2.3*  HGB 10.5* 10.6*  HCT 30.4* 31.5*  PLT 20* 12*   BMET  Recent Labs  05/28/12 1143 05/29/12 0403  NA 133* 131*  K 3.4* 3.4*  CL 98 97  CO2 26 26  GLUCOSE 89 80  BUN 5* 5*  CREATININE 0.46* 0.42*  CALCIUM 8.2* 8.0*    Studies/Results: No results found.  Medications: I have reviewed the patient's current medications.   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 I would reduce the dose of Alimta for the next cycle secondary to recent toxicity with the first cycle of the chemotherapy. 2) neutropenia: Improved. Discontinue Neupogen. 3) thrombocytopenia: Platelets count is still low. Consider transfusion of one unit of platelets today. 4) oral thrush and mucositis: Significantly improved. 5) disposition: The patient would like to in today. I would see her back for followup visit next week.  LOS:  5 days    Isabella Carpenter K. 05/30/2012

## 2012-05-30 NOTE — Progress Notes (Signed)
No Show. She is in the hospital.

## 2012-05-30 NOTE — Progress Notes (Signed)
5/20. Morning labs reveal a platelet count of 16[address]. Pt has cancer on chemotherapy and is followed by her oncologist while in hospital. Will defer transfusion of platelets to hemoc today. Pt placed on bleeding precautions. Jimmye Norman, NP [facility]

## 2012-05-30 NOTE — Progress Notes (Signed)
Patient discharged home, all discharge medications and instructions reviewed and questions answered. Patient to be assisted to vehicle by wheelchair.  

## 2012-05-30 NOTE — Discharge Summary (Signed)
Physician Discharge Summary  Isabella Carpenter ZOX:[id number] DOB: [birth year] DOA: 05/25/2012  PCP: [name], DO  Admit date: 05/25/2012 Discharge date: 05/30/2012  Recommendations for Outpatient Follow-up:  1. Patient will follow and [facility] next week.  Discharge Diagnoses:  Principal Problem:   Dysphagia, unspecified Active Problems:   Esophagitis   Mucositis   Lung cancer   Neutropenia   Thrombocytopenia, unspecified   Ulcer mouth    Discharge Condition: Medically stable for discharge home today. Please note the patient we'll receive platelet transfusion prior to discharge.  Diet recommendation: as tolerated  History of present illness:  67 year old female with past medical history of metastatic non-small cell lung cancer, adenocarcinoma (diagnosed in April 2014), receiving systemic chemotherapy last cycle given 05/18/2012 who presented to [facility] 05/25/2012 with progressive dysphagia and odynophagia. We have initiated the following regimen: Fluconazole, nystatin suspension, Magic mouthwash with lidocaine in addition to acyclovir. Patient feels that her swallowing is significantly improving. Platelet count is down to 12. Patient insists on going home today so we will give 1 unit platelet transfusion prior to discharge and patient will follow up with Dr. Arbutus Ped next week.  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  - Continue acyclovir for 5 more days  Non small cell lung cancer with metastasis  - management per oncology, next chemo 06/08/2012  - appreciate oncology following  Neutropenia  - secondary to sequela of chemotherapy  - WBC count trending up; D/C neupogen Thrombocytopenia, unspecified   - secondary to sequela of chemotherapy  - no signs of bleeding  - platelet count 12 today; will give 1 unit platelet prior to discharge Anemia of chronic disease  - secondary to history of malignancy and sequela of chemotherapy  - Hemoglobin stable in range 10.5-11.9  Hypokalemia  - repleted  Moderate protein calorie malnutrition  - secondary to poor oral intake  - po intake as tolerated thin liquids   Code Status: full code  Family Communication: family at the bedside   Manson Passey, MD  [facility]  Pager [phone]   Consultants:  Oncology Other consultants:  SLP Procedures:  None  Antibiotics:  Fluconazole 05/25/2012 --> for additional 2 weeks on discharge Acyclovir 05/25/2012 --> for additional 5 days on discharge   Discharge Exam: Filed Vitals:   05/30/12 1032  BP: 136/65  Pulse: 71  Temp: 99.8 F (37.7 C)  Resp: 16   Filed Vitals:   05/29/12 2132 05/30/12 0520 05/30/12 1000 05/30/12 1032  BP: 156/70 163/64 118/79 136/65  Pulse: 57 69 70 71  Temp: 97.9 F (36.6 C) 98.4 F (36.9 C) 98.3 F (36.8 C) 99.8 F (37.7 C)  TempSrc: Oral Oral Oral Oral  Resp: 16 14 16 16   Height:      Weight:  57.289 kg (126 lb 4.8 oz)    SpO2: 97% 98%      General: Pt is alert, follows commands appropriately, not in acute distress Cardiovascular: Regular rate and rhythm, S1/S2 +, no murmurs, no rubs, no gallops Respiratory: Clear to auscultation bilaterally, no wheezing, no crackles, no rhonchi Abdominal: Soft, non tender, non distended, bowel sounds +, no guarding Extremities: no edema, no cyanosis, pulses palpable bilaterally DP and PT Neuro: Grossly nonfocal  Discharge Instructions  Discharge Orders   Future Appointments Provider Department Dept Phone   06/06/2012 3:30 PM Lonie Peak, MD [facility] [phone]   06/08/2012 9:15 AM Dava Najjar [facility] [address] [facility] [phone]   06/08/2012 9:45 AM Si Gaul, MD  [place] [facility] [phone]   06/08/2012 10:15 AM [facility] [address] [place] [facility] [phone]   Future Orders Complete By Expires     Call MD for:  difficulty breathing, headache or visual disturbances  As directed     Call MD for:  persistant dizziness or light-headedness  As directed     Call MD for:  persistant nausea and vomiting  As directed     Call MD for:  severe uncontrolled pain  As directed     Diet - low sodium heart healthy  As directed     Increase activity slowly  As directed         Medication List    TAKE these medications       acyclovir 400 MG tablet  Commonly known as:  ZOVIRAX  Take 1 tablet (400 mg total) by mouth 2 (two) times daily.     dexamethasone 4 MG tablet  Commonly known as:  DECADRON  Take 1 tab PO TID with meals     fluconazole 100 MG tablet  Commonly known as:  DIFLUCAN  Take 2 tablets (200 mg total) by mouth daily.     folic acid 1 MG tablet  Commonly known as:  FOLVITE  Take 1 tablet (1 mg total) by mouth daily.     ibuprofen 200 MG tablet  Commonly known as:  ADVIL,MOTRIN  Take 200 mg by mouth every 6 (six) hours as needed for pain. For pain     magic mouthwash w/lidocaine Soln  Take 5 mLs by mouth 4 (four) times daily as needed.     morphine 30 MG 12 hr tablet  Commonly known as:  MS CONTIN  Take 1 tablet (30 mg total) by mouth 2 (two) times daily.     nystatin 100000 UNIT/ML suspension  Commonly known as:  MYCOSTATIN  Take 5 mLs (500,000 Units total) by mouth 4 (four) times daily.     oxyCODONE-acetaminophen 5-325 MG per tablet  Commonly known as:  PERCOCET/ROXICET  Take 1 tablet by mouth every 6 (six) hours as needed for pain.     prochlorperazine 10 MG tablet  Commonly known as:  COMPAZINE  Take 1 tablet (10 mg total) by mouth every 6 (six) hours as needed.     promethazine 25 MG tablet  Commonly known as:  PHENERGAN  Take 1 tablet (25 mg total) by mouth every 6  (six) hours as needed for nausea. Phenergan  25 mg tablets: Take 1 q 4 - 6 hours prn nausea.  Total 30 Tablets with 1 refill           Follow-up Information   Follow up with Kriste Basque R., DO In 2 weeks.   Contact information:   [address] [place]ena Flake [place] [address] [phone]        The results of significant diagnostics from this hospitalization (including imaging, microbiology, ancillary and laboratory) are listed below for reference.    Significant Diagnostic Studies: X-ray Chest Pa And Lateral   05/25/2012   *RADIOLOGY REPORT*  Clinical Data: Chest pain, cough, metastatic lung cancer  CHEST - 2 VIEW  Comparison: 04/16/2012 Correlation:  CT chest 04/16/2012  Findings: Normal heart size and pulmonary vascularity. Right side aortic arch with atherosclerotic calcification. Left upper lobe scarring and volume loss with superior retraction of the left hilum and question hilar enlargement/adenopathy. Peripheral opacity in left upper lobe corresponds to the large mass with central cavitation identified on prior CT exam. Additional bilateral pulmonary nodules are identified including a large right right middle lobe mass 4.5 x 4.3 x 3.8 cm. No definite acute infiltrate, pleural effusion, or pneumothorax. Bones appear diffusely demineralized without obvious osseous metastasis.  IMPRESSION: Left upper lobe mass with upper lobe volume loss and probable left hilar adenopathy. Additional right middle lobe mass. Pulmonary nodules most likely representing pulmonary metastases. No acute infiltrate.   Original Report Authenticated By: Ulyses Southward, M.D.   Dg Swallowing Func-speech Pathology  05/27/2012   Breck Coons [place], CCC-SLP     05/27/2012  3:52 PM Objective Swallowing Evaluation: Modified Barium Swallowing Study   Patient Details  Name: Isabella Carpenter MRN: [id number] Date of Birth: [birth year]  Today's Date: 05/27/2012 Time: 4098-1191 SLP Time Calculation (min): 45 min  Past Medical History:   Past Medical History  Diagnosis Date  . Cancer   . Chicken pox   . GERD (gastroesophageal reflux disease)   . Hypertension   . High cholesterol   . Urine incontinence   . Kidney stone   . Lung cancer 05/08/2012   Past Surgical History:  Past Surgical History  Procedure Laterality Date  . Abdominal hysterectomy    . Axillary lymph node biopsy Left 04/18/2012    Procedure: AXILLARY LYMPH NODE BIOPSY;  Surgeon: Adolph Pollack, MD;  Location: [facility] ORS;  Service: General;   Laterality: Left;  left subclavian. need one hour   HPI:  67 year old female with past medical history of metastatic  non-small cell lung cancer, adenocarcinoma (diagnosed in April  2014), receiving systemic chemotherapy last cycle given 05/18/2012  who presented to [facility] 05/25/2012 with progressive dysphagia and  odynophagia.  Bedside swallow assessment revealed s/s aspiration  with MBS recommended.      Assessment / Plan / Recommendation Clinical Impression  Dysphagia Diagnosis: Moderate oral phase dysphagia;Moderate  pharyngeal phase dysphagia;Severe pharyngeal phase dysphagia Clinical impression: Pt. exhibited moderate oral dysphagia with  delayed oral manipulation and transit and prolonged  mastication/manipulation with small piece cracker.  Pharyngeal  phase includes motor impairments such as reduced tongue base  retraction, decreased laryngeal elevation, decreased  hyo-laryngeal anterior movement, poor epiglottic closure.   Deficits resulted in mod-max pharyngeal residue with aspiration  (silent) of pyriform sinsus residue at rest and during second  swallow in attempts to clear residue. Thin liquids were aspirated  (cough x 1 out of multiple episodes) during and after swallow.   There was no significant difference related to aspiration events  between thin and nectar with nectar consistency resulting in  greater pharyngeal residue.  Chin tuck posture was an ineffective  strategy.  Puree texture resulted in only moderate residue  (perhaps due to a more  moist consistency).  Given pt.'s suspected  poor prognosis with cancer and clinical findings during MBS  recommend pt. continue thin liquids and upgrade to puree Dys 1  texture.  SLP will follow closely and if pt. experiences  increased sensation with purees, SLP can downgrade to liquid  diet.  Swallow precautions include sit upright, small sips/bites,  swallow 2-3 times, volitional cough after every other bite/sip  and continued oral care.       Treatment Recommendation  Therapy as outlined in treatment plan below    Diet Recommendation Dysphagia 1 (Puree);Thin liquid   Liquid Administration via: Cup Medication Administration: Whole meds with puree Supervision: Patient able to self feed;Intermittent supervision  to cue for compensatory strategies Compensations: Slow rate;Small sips/bites Postural Changes and/or Swallow Maneuvers: Seated upright 90  degrees    Other  Recommendations Oral Care Recommendations: Oral care BID   Follow Up Recommendations  None    Frequency and Duration min 2x/week  2 weeks   Pertinent Vitals/Pain none    SLP Swallow Goals Patient will utilize recommended strategies during swallow to  increase swallowing safety with: Moderate cueing   General HPI: 67 year old female with past medical history of  metastatic non-small cell lung cancer, adenocarcinoma (diagnosed  in April 2014), receiving systemic chemotherapy last cycle given  05/18/2012 who presented to [facility] 05/25/2012 with progressive dysphagia  and odynophagia.  Bedside swallow assessment revealed s/s  aspiration with MBS recommended.  Type of Study: Modified Barium Swallowing Study Reason for Referral: Objectively evaluate swallowing function Previous Swallow Assessment: N/A Diet Prior to this Study:  (full liquids) Temperature Spikes Noted: No Respiratory Status: Room air History of Recent Intubation: No Behavior/Cognition: Alert;Cooperative;Requires cueing Oral Cavity - Dentition: Adequate natural dentition Oral Motor / Sensory Function:  Impaired - see Bedside swallow  eval Patient Positioning: Upright in chair Baseline Vocal Quality: Low vocal intensity;Hoarse Anatomy: Within functional limits Pharyngeal Secretions: Not observed secondary MBS    Reason for Referral Objectively evaluate swallowing function   Oral Phase Oral Preparation/Oral Phase Oral Phase: Impaired Oral - Nectar Oral - Nectar Cup: Delayed oral transit (minimal) Oral - Solids Oral - Puree: Delayed oral transit;Weak lingual manipulation Oral - Regular: Delayed oral transit;Weak lingual  manipulation;Impaired mastication;Reduced posterior propulsion   Pharyngeal Phase Pharyngeal Phase Pharyngeal Phase: Impaired Pharyngeal - Nectar Pharyngeal - Nectar Teaspoon: Pharyngeal residue -  valleculae;Pharyngeal residue - pyriform sinuses;Reduced  laryngeal elevation;Reduced tongue base  retraction;Penetration/Aspiration during swallow (penetrate  during 2nd swallow) Penetration/Aspiration details (nectar teaspoon): Material enters  airway, CONTACTS cords and not ejected out Pharyngeal - Nectar Cup: Pharyngeal residue - pyriform  sinuses;Pharyngeal residue - valleculae;Reduced tongue base  retraction;Reduced laryngeal elevation;Penetration/Aspiration  during swallow Penetration/Aspiration details (nectar cup): Material enters  airway, remains ABOVE vocal cords and not ejected out Pharyngeal - Thin Pharyngeal - Thin Cup: Penetration/Aspiration during  swallow;Reduced laryngeal elevation;Reduced airway/laryngeal  closure;Pharyngeal residue - valleculae;Pharyngeal residue -  pyriform sinuses;Reduced tongue base retraction;Reduced anterior  laryngeal mobility;Penetration/Aspiration after swallow (4  immediate consecutive swallows to clear pharynx) Penetration/Aspiration details (thin cup): Material enters  airway, remains ABOVE vocal cords then ejected out;Material  enters airway, passes BELOW cords without attempt by patient to  eject out (silent aspiration) Pharyngeal - Solids Pharyngeal -  Puree: Pharyngeal residue - valleculae;Reduced  tongue base retraction Pharyngeal - Regular: Pharyngeal residue - valleculae;Reduced  tongue base retraction;Pharyngeal residue - pyriform  sinuses;Reduced laryngeal elevation  Cervical Esophageal Phase    GO    Cervical Esophageal Phase Cervical Esophageal Phase: [facility]         Darrow Bussing.Ed CCC-SLP Pager [phone]  05/27/2012    Microbiology: No results found for this or any previous visit (from the past 240 hour(s)).   Labs: Basic Metabolic Panel:  Recent Labs Lab 05/25/12 0929 05/25/12 1344 05/26/12 0349 05/27/12 0355 05/28/12 1143 05/29/12 0403  NA 136  --  135 133* 133* 131*  K 3.6  --  4.2 3.2* 3.4* 3.4*  CL 103  --  101 99 98 97  CO2 23  --  26 28 26 26   GLUCOSE 127*  --  92 79 89 80  BUN 19.3  --  15 9 5* 5*  CREATININE 0.7 0.59 0.48* 0.52 0.46* 0.42*  CALCIUM 8.4  --  8.6 8.2* 8.2* 8.0*   Liver Function Tests:  Recent Labs Lab 05/25/12 0929 05/26/12 0349  AST 28 21  ALT 74* 58*  ALKPHOS 55 52  BILITOT 0.92 0.9  PROT 5.6* 5.4*  ALBUMIN 2.7* 2.7*   No results found for this basename: LIPASE, AMYLASE,  in the last 168 hours No results found for this basename: AMMONIA,  in the last 168 hours CBC:  Recent Labs Lab 05/25/12 0929  05/26/12 0349 05/27/12 0355 05/28/12 1143 05/29/12 0403 05/30/12 0340  WBC 0.6*  < > 0.7* 0.6* 0.5* 0.9* 2.3*  NEUTROABS 0.3*  --   --   --   --   --   --   HGB 13.2  < > 12.4 11.0* 11.9* 10.5* 10.6*  HCT 39.2  < > 36.9 33.1* 34.1* 30.4* 31.5*  MCV 86.7  < > 85.2 85.5 83.8 83.5 83.3  PLT 67*  < > 50* 37* 25* 20* 12*  < > = values in this interval not displayed. Cardiac Enzymes: No results found for this basename: CKTOTAL, CKMB, CKMBINDEX, TROPONINI,  in the last 168 hours BNP: BNP (last 3 results) No results found for this basename: PROBNP,  in the last 8760 hours CBG: No results found for this basename: GLUCAP,  in the last 168 hours  Time coordinating discharge: Over  30 minutes  Signed:  Manson Passey, MD  [facility]  05/30/2012, 11:03 AM  Pager #: [phone]

## 2012-05-31 ENCOUNTER — Ambulatory Visit: Payer: Commercial Managed Care - PPO | Admitting: Internal Medicine

## 2012-05-31 LAB — PREPARE PLATELET PHERESIS: Unit division: 0

## 2012-06-01 ENCOUNTER — Other Ambulatory Visit: Payer: Self-pay | Admitting: *Deleted

## 2012-06-01 NOTE — Progress Notes (Signed)
Pt's daughter called stating that pt has new LE swelling in her ankles, more on her left that is up to her calf, no redness or pain associated with swelling.  Per AJ, okay to schedule LE bilateral doppler to r/o blood clot.  Appt given to pt's daughter [place].  SLJ

## 2012-06-02 ENCOUNTER — Other Ambulatory Visit: Payer: Self-pay | Admitting: Medical Oncology

## 2012-06-02 ENCOUNTER — Ambulatory Visit (HOSPITAL_BASED_OUTPATIENT_CLINIC_OR_DEPARTMENT_OTHER): Payer: Commercial Managed Care - PPO | Admitting: Physician Assistant

## 2012-06-02 ENCOUNTER — Other Ambulatory Visit (HOSPITAL_BASED_OUTPATIENT_CLINIC_OR_DEPARTMENT_OTHER): Payer: Commercial Managed Care - PPO | Admitting: Lab

## 2012-06-02 ENCOUNTER — Telehealth: Payer: Self-pay | Admitting: Medical Oncology

## 2012-06-02 ENCOUNTER — Ambulatory Visit (HOSPITAL_COMMUNITY)
Admission: RE | Admit: 2012-06-02 | Discharge: 2012-06-02 | Disposition: A | Discharge status: Home / Self Care | Payer: Commercial Managed Care - PPO | Source: Ambulatory Visit | Attending: Internal Medicine | Admitting: Internal Medicine

## 2012-06-02 ENCOUNTER — Ambulatory Visit: Payer: Commercial Managed Care - PPO | Admitting: Physician Assistant

## 2012-06-02 ENCOUNTER — Encounter: Payer: Self-pay | Admitting: Radiation Oncology

## 2012-06-02 ENCOUNTER — Encounter: Payer: Self-pay | Admitting: Physician Assistant

## 2012-06-02 VITALS — BP 162/85 | HR 105 | Temp 97.8°F | Resp 16 | Ht 65.0 in | Wt 126.7 lb

## 2012-06-02 DIAGNOSIS — I824Z9 Acute embolism and thrombosis of unspecified deep veins of unspecified distal lower extremity: Secondary | ICD-10-CM | POA: Insufficient documentation

## 2012-06-02 DIAGNOSIS — I803 Phlebitis and thrombophlebitis of lower extremities, unspecified: Secondary | ICD-10-CM | POA: Insufficient documentation

## 2012-06-02 DIAGNOSIS — C3490 Malignant neoplasm of unspecified part of unspecified bronchus or lung: Secondary | ICD-10-CM

## 2012-06-02 DIAGNOSIS — M7989 Other specified soft tissue disorders: Secondary | ICD-10-CM

## 2012-06-02 DIAGNOSIS — I82409 Acute embolism and thrombosis of unspecified deep veins of unspecified lower extremity: Secondary | ICD-10-CM

## 2012-06-02 DIAGNOSIS — I82402 Acute embolism and thrombosis of unspecified deep veins of left lower extremity: Secondary | ICD-10-CM

## 2012-06-02 DIAGNOSIS — L8992 Pressure ulcer of unspecified site, stage 2: Secondary | ICD-10-CM

## 2012-06-02 DIAGNOSIS — L8990 Pressure ulcer of unspecified site, unspecified stage: Secondary | ICD-10-CM

## 2012-06-02 LAB — CBC WITH DIFFERENTIAL/PLATELET
BASO%: 1 % (ref 0.0–2.0)
EOS%: 0 % (ref 0.0–7.0)
HCT: 34.1 % — ABNORMAL LOW (ref 34.8–46.6)
LYMPH%: 6.3 % — ABNORMAL LOW (ref 14.0–49.7)
MCH: 29 pg (ref 25.1–34.0)
MCHC: 34.3 g/dL (ref 31.5–36.0)
MONO%: 4.1 % (ref 0.0–14.0)
NEUT%: 88.6 % — ABNORMAL HIGH (ref 38.4–76.8)
lymph#: 1.1 10*3/uL (ref 0.9–3.3)

## 2012-06-02 LAB — COMPREHENSIVE METABOLIC PANEL (CC13)
AST: 31 U/L (ref 5–34)
Alkaline Phosphatase: 98 U/L (ref 40–150)
BUN: 11.6 mg/dL (ref 7.0–26.0)
Creatinine: 0.7 mg/dL (ref 0.6–1.1)
Potassium: 4.1 mEq/L (ref 3.5–5.1)
Total Bilirubin: 0.4 mg/dL (ref 0.20–1.20)
Total Protein: 5.7 g/dL — ABNORMAL LOW (ref 6.4–8.3)

## 2012-06-02 MED ORDER — ENOXAPARIN SODIUM 80 MG/0.8ML ~~LOC~~ SOLN
80.0000 mg | Freq: Once | SUBCUTANEOUS | Status: AC
  Administered 2012-06-02: 80 mg via SUBCUTANEOUS
  Filled 2012-06-02: qty 0.8

## 2012-06-02 MED ORDER — WARFARIN SODIUM 4 MG PO TABS: ORAL_TABLET | ORAL | Status: DC

## 2012-06-02 NOTE — Patient Instructions (Addendum)
Lovenox 80 mg injection once daily Start taking the Coumadin (warfarin) 4 mg by mouth between 4 pm and 6 pm daily on Saturday 06/03/12 Follow up with Dr. Arbutus Ped and the [facility] on 06/08/12 as scheduled

## 2012-06-02 NOTE — Progress Notes (Signed)
VASCULAR LAB PRELIMINARY  PRELIMINARY  PRELIMINARY  PRELIMINARY  Bilateral lower extremity venous duplex  completed.    Preliminary report:  Right:  No evidence of DVT, superficial thrombosis, or Baker's cyst.  Left: DVT noted in the PTV and peroneal veins.  No evidence of superficial thrombosis.  No Baker's cyst.   Briya Lookabaugh, RVT 06/02/2012, 9:42 AM

## 2012-06-02 NOTE — Progress Notes (Signed)
[facility] Telephone:([phone]) [phone]   Fax:([phone]) [phone]  OFFICE PROGRESS NOTE  [name], DO [address] [place] [address]  DIAGNOSIS: Metastatic non-small cell lung cancer, adenocarcinoma diagnosed in April 2014. EGFR mutation as well as ALK gene translocation are both negative.  PRIOR THERAPY: Status post whole brain irradiation under the care of Dr. Selina Cooley completed on 05/03/2012  CURRENT THERAPY:  Systemic chemotherapy was carboplatin for AUC of 5 and Alimta 500 mg/M2 every 3 weeks status post 1 cycle given on 05/18/2012  INTERVAL HISTORY: Isabella Carpenter 67 y.o. female returns to the clinic today for followup visit accompanied by her daughter. She presents for a work in visit after noticing left lower extremity edema that was nonpainful. The leg is nonpainful however it is weak. Patient was discharged from the hospital approximately 3 days ago. 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 She was discharged on a course of acyclovir and Diflucan.She has one more dose of acyclovir remaining and Diflucan which will be completed on 06/09/2012. Since being discharged from the hospital she has been able to eat and drink much better. She has managed to gain a few pounds. She had bilateral lower tumor he Dopplers performed today and presents to discuss the results of that study. In the study was positive for left PTV and peroneal vein thrombosis.  She denied having any significant chest pain, shortness of breath, cough or hemoptysis. The patient denied having any significant night sweats.   MEDICAL HISTORY: Past Medical History  Diagnosis Date  . Cancer   . Chicken pox   . GERD (gastroesophageal reflux disease)   . Hypertension   . High cholesterol   . Urine incontinence   . Kidney stone   . Lung cancer 05/08/2012    ALLERGIES:  has No Known Allergies.  MEDICATIONS:    Current Outpatient Prescriptions  Medication Sig Dispense Refill  . acyclovir (ZOVIRAX) 400 MG tablet Take 1 tablet (400 mg total) by mouth 2 (two) times daily.  10 tablet  0  . Alum & Mag Hydroxide-Simeth (MAGIC MOUTHWASH W/LIDOCAINE) SOLN Take 5 mLs by mouth 4 (four) times daily as needed.  100 mL  0  . dexamethasone (DECADRON) 4 MG tablet Take 1 tab PO TID with meals  90 tablet  1  . fluconazole (DIFLUCAN) 100 MG tablet Take 2 tablets (200 mg total) by mouth daily.  14 tablet  0  . folic acid (FOLVITE) 1 MG tablet Take 1 tablet (1 mg total) by mouth daily.  30 tablet  3  . morphine (MS CONTIN) 30 MG 12 hr tablet Take 1 tablet (30 mg total) by mouth 2 (two) times daily.  60 tablet  0  . nystatin (MYCOSTATIN) 100000 UNIT/ML suspension Take 5 mLs (500,000 Units total) by mouth 4 (four) times daily.  240 mL  0  . oxyCODONE-acetaminophen (PERCOCET/ROXICET) 5-325 MG per tablet Take 1 tablet by mouth every 6 (six) hours as needed for pain.  60 tablet  0  . prochlorperazine (COMPAZINE) 10 MG tablet Take 1 tablet (10 mg total) by mouth every 6 (six) hours as needed.  60 tablet  0  . promethazine (PHENERGAN) 25 MG tablet Take 1 tablet (25 mg total) by mouth every 6 (six) hours as needed for nausea. Phenergan  25 mg tablets: Take 1 q 4 - 6 hours prn nausea.  Total 30 Tablets with 1  refill  60 tablet  0  . warfarin (COUMADIN) 4 MG tablet Take 1 tablet by mouth daily between 4 pm and 6 pm  30 tablet  0   No current facility-administered medications for this visit.    SURGICAL HISTORY:  Past Surgical History  Procedure Laterality Date  . Abdominal hysterectomy    . Axillary lymph node biopsy Left 04/18/2012    Procedure: AXILLARY LYMPH NODE BIOPSY;  Surgeon: Adolph Pollack, MD;  Location: [facility];  Service: General;  Laterality: Left;  left subclavian. need one hour    REVIEW OF SYSTEMS:  A comprehensive review of systems was negative except for: Constitutional: positive for fatigue Cardiovascular:  positive for lower extremity edema   PHYSICAL EXAMINATION: General appearance: alert, cooperative, fatigued and no distress Head: Normocephalic, without obvious abnormality, atraumatic Neck: no adenopathy and Well-healed surgical incision from lymph node biopsy left neck, supraclavicular area Lymph nodes: Cervical, supraclavicular, and axillary nodes normal. Resp: clear to auscultation bilaterally Cardio: regular rate and rhythm, S1, S2 normal, no murmur, click, rub or gallop GI: soft, non-tender; bowel sounds normal; no masses,  no organomegaly Extremities: edema Right lower extremity with trace pitting edema left lower extremity with 2+ pitting edema, no point tenderness or palpable cords Neurologic: Alert and oriented X 3, normal strength and tone. Normal symmetric reflexes. Normal coordination and gait Mouth: minimal thrush Skin: There are 2 decubital ulcers on the sacral area, both with some eschar formation and no evidence of infection  ECOG PERFORMANCE STATUS: 1 - Symptomatic but completely ambulatory  Blood pressure 162/85, pulse 105, temperature 97.8 F (36.6 C), temperature source Oral, resp. rate 16, height 5\' 5"  (1.651 m), weight 126 lb 11.2 oz (57.471 kg), SpO2 99.00%. Weight is down 9.5 pounds from 05/08/2012  LABORATORY DATA: Lab Results  Component Value Date   WBC 16.7* 06/02/2012   HGB 11.7 06/02/2012   HCT 34.1* 06/02/2012   MCV 84.4 06/02/2012   PLT 67* 06/02/2012      Chemistry      Component Value Date/Time   NA 136 06/02/2012 1008   NA 131* 05/29/2012 0403   K 4.1 06/02/2012 1008   K 3.4* 05/29/2012 0403   CL 101 06/02/2012 1008   CL 97 05/29/2012 0403   CO2 25 06/02/2012 1008   CO2 26 05/29/2012 0403   BUN 11.6 06/02/2012 1008   BUN 5* 05/29/2012 0403   CREATININE 0.7 06/02/2012 1008   CREATININE 0.42* 05/29/2012 0403      Component Value Date/Time   CALCIUM 8.7 06/02/2012 1008   CALCIUM 8.0* 05/29/2012 0403   ALKPHOS 98 06/02/2012 1008   ALKPHOS 52 05/26/2012 0349    AST 31 06/02/2012 1008   AST 21 05/26/2012 0349   ALT 71* 06/02/2012 1008   ALT 58* 05/26/2012 0349   BILITOT 0.40 06/02/2012 1008   BILITOT 0.9 05/26/2012 0349       RADIOGRAPHIC STUDIES: Dg Chest 2 View  04/16/2012  *RADIOLOGY REPORT*  Clinical Data: Chest pain.  Neck pain.  Tobacco use.  CHEST - 2 VIEW  Comparison: None.  Findings: Scattered pulmonary nodules and pulmonary masses are observed bilaterally.  An index mass in the right to middle lobe measures 5.4 x 3.2 by 5.2 cm.  Mass-like scarring noted along the left lung apex.  No cardiomegaly noted, but the aortic arch seems to be right-sided.  Unusual lucency at the T12-L1 level on the frontal projection may to be due to bowel gas although a vertebral anomaly  cannot be totally excluded.  No pleural effusion is identified.  IMPRESSION:  1.  Scattered nodules and masses throughout both lungs.  Appearance favors metastatic malignancy to both lungs.  Fungal pneumonia and rheumatoid nodules are less likely differential diagnostic considerations.  CT chest with contrast is recommended for further workup, and considering the bilaterality of disease, imaging of the abdomen and pelvis may be prudent as well. 2.  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 3.  Lucency at the thoracolumbar junction on the frontal projection is somewhat unusual but probably simply due to bowel gas.  This could be further characterized on the CT exam to ensure that does not represent a vertebral anomaly.   Original Report Authenticated By: Gaylyn Rong, M.D.    Ct Head W Wo Contrast  04/16/2012  *RADIOLOGY REPORT*  Clinical Data: Forgetful; known lung mass.  Assess for brain metastases.  CT HEAD WITHOUT AND WITH CONTRAST  Technique:  Contiguous axial images were obtained from the base of the skull through the vertex without and with intravenous contrast.  Contrast: OMNIPAQUE IOHEXOL 300 MG/ML  SOLN  Comparison: None.  Findings: Evaluation is suboptimal due  to motion artifact. However, multiple prominent areas of decreased attenuation are seen throughout the cerebral hemispheres and at the left side of the cerebellum, compatible with multiple metastases.  Identifiable masses measure perhaps 2.0 cm at the high left frontal lobe, 2.5 cm at the expected location of the frontal horn of the left lateral ventricle, 1.5 cm at the left occipital lobe, 1.5 cm at the left cerebellar hemisphere and 1.7 cm at the posterior right parietal lobe.  There is perhaps 7 mm of rightward midline shift, though this is difficult to fully characterize.  There is no evidence of acute infarction, or intra- or extra-axial hemorrhage on CT.  There is no evidence of fracture; visualized osseous structures are unremarkable in appearance.  The visualized portions of the orbits are within normal limits.  The paranasal sinuses and mastoid air cells are well-aerated.  No significant soft tissue abnormalities are seen.  IMPRESSION:  1.  Multiple cerebral and cerebellar metastases noted, with extensive areas of decreased attenuation, effacement of the frontal horn of the left lateral ventricle and perhaps 7 mm of rightward midline shift. 2.  No definite evidence of intracranial hemorrhage.  These results were called by telephone on 04/16/2012 at 09:21 p.m. to Earley Favor PA, who verbally acknowledged these results.   Original Report Authenticated By: Tonia Ghent, M.D.    Ct Chest W Contrast  04/16/2012  *RADIOLOGY REPORT*  Clinical Data:  Assess for chest and abdominal metastatic disease; known lung masses.  CT CHEST, ABDOMEN AND PELVIS WITH CONTRAST  Technique:  Multidetector CT imaging of the chest, abdomen and pelvis was performed following the standard protocol during bolus administration of intravenous contrast.  Contrast: OMNIPAQUE IOHEXOL 300 MG/ML  SOLN  Comparison:   None.  CT CHEST  Findings:  There are two dominant masses within the lungs, with a third somewhat smaller mass also  seen.  The largest mass is noted at the left upper lobe, with significant architectural distortion; it measures approximately 8.6 x 4.4 x 6.5 cm, with partial occlusion of the bronchioles to the left upper lobe, and marked mass effect on the left main pulmonary artery.  A large 6.2 x 3.7 x 3.9 cm mass is noted within the right middle lobe.  There is also a spiculated 3.1 x 3.1 x 2.4 cm mass at the right upper  lobe, with significant architectural distortion.  Innumerable smaller metastases are noted throughout both lungs.  No pleural effusion is identified.  There is mild underlying emphysema, noted particularly at the right upper lobe.  No pneumothorax is seen.  Scattered coronary artery calcifications are noted.  The patient's left upper lobe mass extends into the mediastinum, with irregular lobulations.  It remains a few millimeters from both the trachea and the descending thoracic aorta, though slightly closer to the course of the left subclavian artery.  Scattered metastases are noted within the mediastinum, demonstrating centrally decreased attenuation.  These are noted at the right paratracheal and periaortic regions, measuring up to 1.3 cm in short axis.  No pericardial effusion is identified. Scattered calcific atherosclerotic disease is noted along the aortic arch.  The great vessels are grossly unremarkable in appearance.  Minimal hypodensity at the left thyroid lobe is nonspecific in appearance, and likely benign given its size.  An enlarged 1.4 cm left supraclavicular node is seen.  Scattered axillary nodes remain normal in size.  No acute osseous abnormalities are seen.  IMPRESSION:  1.  Two dominant masses within the lungs, at the left upper lobe and right middle lobe.  Smaller spiculated mass at the right upper lobe.  Significant associated architectural distortion; the left upper lobe mass extends into the mediastinum.  Partial occlusion of the bronchus to the left upper lobe, and significant mass  effect on the left main pulmonary artery. 2.  Innumerable small metastases noted throughout both lungs. 3.  Mild underlying emphysema, particularly at the right upper lobe. 4.  Scattered coronary artery calcifications seen. 5.  Scattered metastases within the mediastinum, at the right paratracheal and periaortic regions, measuring up to 1.3 cm in short axis. 6.  1.4 cm centrally hypodense left supraclavicular node, concerning for metastatic disease. 7.  Minimal hypodensity at the left thyroid lobe is nonspecific, and likely benign given its size.  CT ABDOMEN AND PELVIS  Findings:  A few hypodensities within the liver are nonspecific but likely reflect small cysts.  The liver is otherwise unremarkable in appearance.  Two vague hypodense lesions are noted within the spleen, measuring 2.0 and 1.5 cm, concerning for metastases.  There is a large complex heterogeneous left adrenal mass, measuring 4.6 cm in size, concerning for metastatic disease.  The right adrenal gland is unremarkable in appearance.  A large stone is noted within the gallbladder; the gallbladder is otherwise grossly unremarkable.  The pancreas is grossly unremarkable.  A nonobstructing 7 mm stone is noted at the interpole region of the left kidney.  Scattered bilateral renal cysts are seen, measuring up to 1.7 cm in size. There is no evidence of hydronephrosis.  No obstructing ureteral stones are seen.  No perinephric stranding is appreciated.  No free fluid is identified.  The small bowel is unremarkable in appearance.  The stomach is within normal limits.  No acute vascular abnormalities are seen.  Scattered calcification is noted along the abdominal aorta and its branches.  The appendix is normal in caliber, without evidence for appendicitis.  The colon is unremarkable in appearance.  The bladder is mildly distended and grossly unremarkable in appearance.  The patient is status post hysterectomy.  No suspicious adnexal masses are seen; the left  ovary is unremarkable in appearance.  No inguinal lymphadenopathy is seen.  No acute osseous abnormalities are identified.  There is nonspecific sclerosis at the pubic symphysis, and vacuum phenomenon and mild sclerotic change are noted at the sacroiliac joints bilaterally.  Degenerative change is noted along the lower lumbar spine.  IMPRESSION:  1.  Large complex left adrenal mass, measuring 4.6 cm, concerning for metastatic disease. 2.  Two vague hypodensities within the spleen, concerning for metastatic disease. 3.  Small hepatic and renal hypodensities likely reflect cysts. 4.  Cholelithiasis; gallbladder otherwise unremarkable in appearance. 5.  Nonobstructing 7 mm stone at the interpole region of the left kidney. 6.  Scattered calcification along the abdominal aorta and its branches. 7.  No definite evidence for osseous metastatic disease; nonspecific sclerosis at the pubic symphysis, and mild degenerative change along the lower lumbar spine and at the sacroiliac joints.   Original Report Authenticated By: Tonia Ghent, M.D.    Ct Abdomen Pelvis W Contrast  04/16/2012  *RADIOLOGY REPORT*  Clinical Data:  Assess for chest and abdominal metastatic disease; known lung masses.  CT CHEST, ABDOMEN AND PELVIS WITH CONTRAST  Technique:  Multidetector CT imaging of the chest, abdomen and pelvis was performed following the standard protocol during bolus administration of intravenous contrast.  Contrast: OMNIPAQUE IOHEXOL 300 MG/ML  SOLN  Comparison:   None.  CT CHEST  Findings:  There are two dominant masses within the lungs, with a third somewhat smaller mass also seen.  The largest mass is noted at the left upper lobe, with significant architectural distortion; it measures approximately 8.6 x 4.4 x 6.5 cm, with partial occlusion of the bronchioles to the left upper lobe, and marked mass effect on the left main pulmonary artery.  A large 6.2 x 3.7 x 3.9 cm mass is noted within the right middle lobe.  There is  also a spiculated 3.1 x 3.1 x 2.4 cm mass at the right upper lobe, with significant architectural distortion.  Innumerable smaller metastases are noted throughout both lungs.  No pleural effusion is identified.  There is mild underlying emphysema, noted particularly at the right upper lobe.  No pneumothorax is seen.  Scattered coronary artery calcifications are noted.  The patient's left upper lobe mass extends into the mediastinum, with irregular lobulations.  It remains a few millimeters from both the trachea and the descending thoracic aorta, though slightly closer to the course of the left subclavian artery.  Scattered metastases are noted within the mediastinum, demonstrating centrally decreased attenuation.  These are noted at the right paratracheal and periaortic regions, measuring up to 1.3 cm in short axis.  No pericardial effusion is identified. Scattered calcific atherosclerotic disease is noted along the aortic arch.  The great vessels are grossly unremarkable in appearance.  Minimal hypodensity at the left thyroid lobe is nonspecific in appearance, and likely benign given its size.  An enlarged 1.4 cm left supraclavicular node is seen.  Scattered axillary nodes remain normal in size.  No acute osseous abnormalities are seen.  IMPRESSION:  1.  Two dominant masses within the lungs, at the left upper lobe and right middle lobe.  Smaller spiculated mass at the right upper lobe.  Significant associated architectural distortion; the left upper lobe mass extends into the mediastinum.  Partial occlusion of the bronchus to the left upper lobe, and significant mass effect on the left main pulmonary artery. 2.  Innumerable small metastases noted throughout both lungs. 3.  Mild underlying emphysema, particularly at the right upper lobe. 4.  Scattered coronary artery calcifications seen. 5.  Scattered metastases within the mediastinum, at the right paratracheal and periaortic regions, measuring up to 1.3 cm in short  axis. 6.  1.4 cm centrally hypodense left supraclavicular  node, concerning for metastatic disease. 7.  Minimal hypodensity at the left thyroid lobe is nonspecific, and likely benign given its size.  CT ABDOMEN AND PELVIS  Findings:  A few hypodensities within the liver are nonspecific but likely reflect small cysts.  The liver is otherwise unremarkable in appearance.  Two vague hypodense lesions are noted within the spleen, measuring 2.0 and 1.5 cm, concerning for metastases.  There is a large complex heterogeneous left adrenal mass, measuring 4.6 cm in size, concerning for metastatic disease.  The right adrenal gland is unremarkable in appearance.  A large stone is noted within the gallbladder; the gallbladder is otherwise grossly unremarkable.  The pancreas is grossly unremarkable.  A nonobstructing 7 mm stone is noted at the interpole region of the left kidney.  Scattered bilateral renal cysts are seen, measuring up to 1.7 cm in size. There is no evidence of hydronephrosis.  No obstructing ureteral stones are seen.  No perinephric stranding is appreciated.  No free fluid is identified.  The small bowel is unremarkable in appearance.  The stomach is within normal limits.  No acute vascular abnormalities are seen.  Scattered calcification is noted along the abdominal aorta and its branches.  The appendix is normal in caliber, without evidence for appendicitis.  The colon is unremarkable in appearance.  The bladder is mildly distended and grossly unremarkable in appearance.  The patient is status post hysterectomy.  No suspicious adnexal masses are seen; the left ovary is unremarkable in appearance.  No inguinal lymphadenopathy is seen.  No acute osseous abnormalities are identified.  There is nonspecific sclerosis at the pubic symphysis, and vacuum phenomenon and mild sclerotic change are noted at the sacroiliac joints bilaterally.  Degenerative change is noted along the lower lumbar spine.  IMPRESSION:  1.  Large  complex left adrenal mass, measuring 4.6 cm, concerning for metastatic disease. 2.  Two vague hypodensities within the spleen, concerning for metastatic disease. 3.  Small hepatic and renal hypodensities likely reflect cysts. 4.  Cholelithiasis; gallbladder otherwise unremarkable in appearance. 5.  Nonobstructing 7 mm stone at the interpole region of the left kidney. 6.  Scattered calcification along the abdominal aorta and its branches. 7.  No definite evidence for osseous metastatic disease; nonspecific sclerosis at the pubic symphysis, and mild degenerative change along the lower lumbar spine and at the sacroiliac joints.   Original Report Authenticated By: Tonia Ghent, M.D.    X-ray Chest Pa And Lateral   05/25/2012   *RADIOLOGY REPORT*  Clinical Data: Chest pain, cough, metastatic lung cancer  CHEST - 2 VIEW  Comparison: 04/16/2012 Correlation:  CT chest 04/16/2012  Findings: Normal heart size and pulmonary vascularity. Right side aortic arch with atherosclerotic calcification. Left upper lobe scarring and volume loss with superior retraction of the left hilum and question hilar enlargement/adenopathy. Peripheral opacity in left upper lobe corresponds to the large mass with central cavitation identified on prior CT exam. Additional bilateral pulmonary nodules are identified including a large right right middle lobe mass 4.5 x 4.3 x 3.8 cm. No definite acute infiltrate, pleural effusion, or pneumothorax. Bones appear diffusely demineralized without obvious osseous metastasis.  IMPRESSION: Left upper lobe mass with upper lobe volume loss and probable left hilar adenopathy. Additional right middle lobe mass. Pulmonary nodules most likely representing pulmonary metastases. No acute infiltrate.   Original Report Authenticated By: Ulyses Southward, M.D.   Dg Swallowing Func-speech Pathology  05/27/2012   Breck Coons [place], CCC-SLP     05/27/2012  3:52  PM Objective Swallowing Evaluation: Modified Barium Swallowing  Study   Patient Details  Name: Isabella Carpenter MRN: [id number] Date of Birth: [birth year]  Today's Date: 05/27/2012 Time: 4098-1191 SLP Time Calculation (min): 45 min  Past Medical History:  Past Medical History  Diagnosis Date  . Cancer   . Chicken pox   . GERD (gastroesophageal reflux disease)   . Hypertension   . High cholesterol   . Urine incontinence   . Kidney stone   . Lung cancer 05/08/2012   Past Surgical History:  Past Surgical History  Procedure Laterality Date  . Abdominal hysterectomy    . Axillary lymph node biopsy Left 04/18/2012    Procedure: AXILLARY LYMPH NODE BIOPSY;  Surgeon: Adolph Pollack, MD;  Location: [facility];  Service: General;   Laterality: Left;  left subclavian. need one hour   HPI:  67 year old female with past medical history of metastatic  non-small cell lung cancer, adenocarcinoma (diagnosed in April  2014), receiving systemic chemotherapy last cycle given 05/18/2012  who presented to [facility] 05/25/2012 with progressive dysphagia and  odynophagia.  Bedside swallow assessment revealed s/s aspiration  with MBS recommended.      Assessment / Plan / Recommendation Clinical Impression  Dysphagia Diagnosis: Moderate oral phase dysphagia;Moderate  pharyngeal phase dysphagia;Severe pharyngeal phase dysphagia Clinical impression: Pt. exhibited moderate oral dysphagia with  delayed oral manipulation and transit and prolonged  mastication/manipulation with small piece cracker.  Pharyngeal  phase includes motor impairments such as reduced tongue base  retraction, decreased laryngeal elevation, decreased  hyo-laryngeal anterior movement, poor epiglottic closure.   Deficits resulted in mod-max pharyngeal residue with aspiration  (silent) of pyriform sinsus residue at rest and during second  swallow in attempts to clear residue. Thin liquids were aspirated  (cough x 1 out of multiple episodes) during and after swallow.   There was no significant difference related to aspiration events  between thin and nectar  with nectar consistency resulting in  greater pharyngeal residue.  Chin tuck posture was an ineffective  strategy.  Puree texture resulted in only moderate residue  (perhaps due to a more moist consistency).  Given pt.'s suspected  poor prognosis with cancer and clinical findings during MBS  recommend pt. continue thin liquids and upgrade to puree Dys 1  texture.  SLP will follow closely and if pt. experiences  increased sensation with purees, SLP can downgrade to liquid  diet.  Swallow precautions include sit upright, small sips/bites,  swallow 2-3 times, volitional cough after every other bite/sip  and continued oral care.       Treatment Recommendation  Therapy as outlined in treatment plan below    Diet Recommendation Dysphagia 1 (Puree);Thin liquid   Liquid Administration via: Cup Medication Administration: Whole meds with puree Supervision: Patient able to self feed;Intermittent supervision  to cue for compensatory strategies Compensations: Slow rate;Small sips/bites Postural Changes and/or Swallow Maneuvers: Seated upright 90  degrees    Other  Recommendations Oral Care Recommendations: Oral care BID   Follow Up Recommendations  None    Frequency and Duration min 2x/week  2 weeks   Pertinent Vitals/Pain none    SLP Swallow Goals Patient will utilize recommended strategies during swallow to  increase swallowing safety with: Moderate cueing   General HPI: 67 year old female with past medical history of  metastatic non-small cell lung cancer, adenocarcinoma (diagnosed  in April 2014), receiving systemic chemotherapy last cycle given  05/18/2012 who presented to [facility]/25/2012 with progressive  dysphagia  and odynophagia.  Bedside swallow assessment revealed s/s  aspiration with MBS recommended.  Type of Study: Modified Barium Swallowing Study Reason for Referral: Objectively evaluate swallowing function Previous Swallow Assessment: N/A Diet Prior to this Study:  (full liquids) Temperature Spikes Noted: No Respiratory  Status: Room air History of Recent Intubation: No Behavior/Cognition: Alert;Cooperative;Requires cueing Oral Cavity - Dentition: Adequate natural dentition Oral Motor / Sensory Function: Impaired - see Bedside swallow  eval Patient Positioning: Upright in chair Baseline Vocal Quality: Low vocal intensity;Hoarse Anatomy: Within functional limits Pharyngeal Secretions: Not observed secondary MBS    Reason for Referral Objectively evaluate swallowing function   Oral Phase Oral Preparation/Oral Phase Oral Phase: Impaired Oral - Nectar Oral - Nectar Cup: Delayed oral transit (minimal) Oral - Solids Oral - Puree: Delayed oral transit;Weak lingual manipulation Oral - Regular: Delayed oral transit;Weak lingual  manipulation;Impaired mastication;Reduced posterior propulsion   Pharyngeal Phase Pharyngeal Phase Pharyngeal Phase: Impaired Pharyngeal - Nectar Pharyngeal - Nectar Teaspoon: Pharyngeal residue -  valleculae;Pharyngeal residue - pyriform sinuses;Reduced  laryngeal elevation;Reduced tongue base  retraction;Penetration/Aspiration during swallow (penetrate  during 2nd swallow) Penetration/Aspiration details (nectar teaspoon): Material enters  airway, CONTACTS cords and not ejected out Pharyngeal - Nectar Cup: Pharyngeal residue - pyriform  sinuses;Pharyngeal residue - valleculae;Reduced tongue base  retraction;Reduced laryngeal elevation;Penetration/Aspiration  during swallow Penetration/Aspiration details (nectar cup): Material enters  airway, remains ABOVE vocal cords and not ejected out Pharyngeal - Thin Pharyngeal - Thin Cup: Penetration/Aspiration during  swallow;Reduced laryngeal elevation;Reduced airway/laryngeal  closure;Pharyngeal residue - valleculae;Pharyngeal residue -  pyriform sinuses;Reduced tongue base retraction;Reduced anterior  laryngeal mobility;Penetration/Aspiration after swallow (4  immediate consecutive swallows to clear pharynx) Penetration/Aspiration details (thin cup): Material enters   airway, remains ABOVE vocal cords then ejected out;Material  enters airway, passes BELOW cords without attempt by patient to  eject out (silent aspiration) Pharyngeal - Solids Pharyngeal - Puree: Pharyngeal residue - valleculae;Reduced  tongue base retraction Pharyngeal - Regular: Pharyngeal residue - valleculae;Reduced  tongue base retraction;Pharyngeal residue - pyriform  sinuses;Reduced laryngeal elevation  Cervical Esophageal Phase    GO    Cervical Esophageal Phase Cervical Esophageal Phase: [facility]         Darrow Bussing.Ed CCC-SLP Pager [phone]  05/27/2012    ASSESSMENT/PLAN: This is a very pleasant 67 years old white female with metastatic non-small cell lung cancer, adenocarcinoma with brain metastasis status post whole brain irradiation. The molecular study for EGFR mutation as well as ALK gene translocation are both negative. She status post 1 cycle of systemic chemotherapy with carboplatin for an AUC of 5 and Alimta 500 mg router squared given every 3 weeks. Her first cycle of chemotherapy was given on 05/18/2012. Patient was discussed with Dr. Arbutus Ped. V. bilateral lower extremity Doppler study revealed left lower extremity thrombus in the PT V. and peroneal veins. Patient will refer to the [facility] Coumadin clinic. She will be started on Lovenox at 80 mg subcutaneously daily and will begin Coumadin starting 06/03/2012 at a dose of 4 mg in difference to her weight and low albumin. She's also going to be on Diflucan for an additional week. She will followup with Dr. Arbutus Ped as previously scheduled on 06/08/2012 we'll also be reevaluated by the Coumadin clinic on that date as well. Both patient and her daughter were talked how to use the Lovenox injections. Bleeding precautions were discussed with the patient and her daughter and both voiced understanding. We'll refer her to wound care clinic regarding her decubital  ulcers.    Laural Benes, Andra Heslin E, PA-C   The patient was advised  to call immediately if she has any concerning symptoms in the interval.  All questions were answered. The patient knows to call the clinic with any problems, questions or concerns. We can certainly see the patient much sooner if necessary.  I spent 20 minutes counseling the patient face to face. The total time spent in the appointment was 30 minutes.

## 2012-06-02 NOTE — Progress Notes (Signed)
Pt has 2 sacral decubitus approx 1-2 cm in diameter that look clean without any sign of infection. Pt referred to wound center

## 2012-06-02 NOTE — Telephone Encounter (Signed)
Per 1st 5/23 pof KSA w/MM for 5/29. Per 2nd 5/23 pof add CC appt for 5/29 and coord w/other appt and make appt @ wound center. Added CC appt for after MM 5/29 - start time remains the same. S/w Harriett Sine @ [facility] and per Harriett Sine she has already made arrangements for this appt w/MM nurse Diane and Diane to pass appt info for 6/6 @ 1pm on to pt.

## 2012-06-02 NOTE — Telephone Encounter (Signed)
Appt made at [facility] for pt -June [phone]. i called pt with information.

## 2012-06-02 NOTE — Telephone Encounter (Signed)
Per 1st 5/23 pof KSA w/MM for 5/29. Per 2nd 5/23 pof add CC appt for 5/29 and coord w/other appt and make appt @ wound center. Added CC appt for after MM 5/29 - start time remains the same. S/w Nancy @ [facility] and per Nancy she has already made arrangements for this appt w/MM nurse Diane and Diane to pass appt info for 6/6 @ 1pm on to pt.  °

## 2012-06-02 NOTE — Progress Notes (Signed)
Rec'd telephone verbal report from [place] in Vascular that patient is positive for DVT in LL ext. Report being completed now. Instructed to have patient come to [facility], we will be establishing some appts for her for treatment once reviewed by clinician. Desk nurse Vincent Peyer, RN notified.

## 2012-06-06 ENCOUNTER — Ambulatory Visit
Admission: RE | Admit: 2012-06-06 | Discharge: 2012-06-06 | Disposition: A | Discharge status: Home / Self Care | Payer: Commercial Managed Care - PPO | Source: Ambulatory Visit | Attending: Radiation Oncology | Admitting: Radiation Oncology

## 2012-06-06 ENCOUNTER — Encounter: Payer: Self-pay | Admitting: Radiation Oncology

## 2012-06-06 VITALS — BP 155/92 | HR 78 | Temp 97.5°F | Ht 65.0 in | Wt 120.1 lb

## 2012-06-06 DIAGNOSIS — C7949 Secondary malignant neoplasm of other parts of nervous system: Secondary | ICD-10-CM | POA: Insufficient documentation

## 2012-06-06 DIAGNOSIS — C7931 Secondary malignant neoplasm of brain: Secondary | ICD-10-CM

## 2012-06-06 HISTORY — DX: Personal history of irradiation: Z92.3

## 2012-06-06 HISTORY — DX: Personal history of antineoplastic chemotherapy: Z92.21

## 2012-06-06 NOTE — Progress Notes (Addendum)
Ms. Hagmann here with her daughter.  She had 10 fractions to her whole brain.  She recently was in the hospital for low WBC's and a dvt in her left leg.  She is on lovenox and coumadin.  Her left leg is swollen but she states it is much better.  She denies pain, nausea, dizziness and tingling/numbness in her hands and feet.  She states the morphine is adequately controling her pain.  She does have blurred vision.  Her walker helps her with her balance.  She is having constipation and is wondering if there is anything she can take for it.  She is currently taking miralax once a day without results.   She does have shortness of breath with activity.  She does have a cough at night and is bringing up white/pink sputum.  She will have chemotherapy again on Thursday if her lab work is OK.  She is also wondering if she still needs to take nystatin.  She states that she still has two sores on the left side of her mouth but they are not painful.  Patient also told Dr. Basilio Cairo that she has two sores on the her sacrum.  She was givin Replicar dressings and was advised to change them every 5 days or as needed if they are soiled.  She was advised to clean around the area with skin prep and then apply the Replicar dressing.  She was educated to apply moisturizer or barrier cream to the red area under her buttock.

## 2012-06-06 NOTE — Patient Instructions (Addendum)
Decadron Taper Take 4mg  twice daily from today through June 2nd.  Take 4mg  once daily from June 3rd through June 12th.  Take 2mg  (half tablet) once daily from June 13th through June 22nd.  From June 23 through July 1, take 2mg  (half tablet) every other day.

## 2012-06-06 NOTE — Progress Notes (Signed)
Radiation Oncology         [phone] ________________________________  Name: Isabella Carpenter MRN: [id number]  Date: 06/06/2012  DOB: [birth year]  Follow-Up Visit Note  Outpatient  CC: [name], DO  Cristal Ford, MD  Diagnosis: Brain Metastases- Metastatic non-small cell lung cancer, adenocarcinoma  Indication for treatment: palliative  Radiation treatment dates: 04/20/2012-05/03/2012  Site/dose: Whole Brain / 30Gy/ 10 fractions   Narrative:  The patient returns today for routine follow-up.  She was recently hospitalized for neutropenia and a DVT in her left leg. She is now on blood thinners. Overall she feels pretty good. She is recovering from some bedsores. She also has some constipation and is taking MiraLAX once a day with some minimal alleviation of this.   She is finishing nystatin for oral thrush. When she was hospitalized, for unclear reasons her Decadron taper was reversed back to 4 mg 3 times a day. The patient and her daughter report that they had told the staff that she was currently on a taper. She is still taking 4 mg 3 times a day. She denies headaches or nausea. No new neurologic complaints. She is receiving systemic therapy in medical oncology.     Status post one cycle of carboplatin and Alimta. She'll be reevaluated soon to see if she can tolerate another cycle.                     ALLERGIES:  has No Known Allergies.  Meds: Current Outpatient Prescriptions  Medication Sig Dispense Refill  . Alum & Mag Hydroxide-Simeth (MAGIC MOUTHWASH W/LIDOCAINE) SOLN Take 5 mLs by mouth 4 (four) times daily as needed.  100 mL  0  . dexamethasone (DECADRON) 4 MG tablet Take 1 tab PO TID with meals  90 tablet  1  . enoxaparin (LOVENOX) 100 MG/ML injection Inject 1 mg/kg into the skin daily.      . fluconazole (DIFLUCAN) 100 MG tablet Take 2 tablets (200 mg total) by mouth daily.  14 tablet  0  . folic acid (FOLVITE) 1 MG tablet Take 1 tablet (1 mg total) by mouth daily.  30  tablet  3  . morphine (MS CONTIN) 30 MG 12 hr tablet Take 1 tablet (30 mg total) by mouth 2 (two) times daily.  60 tablet  0  . nystatin (MYCOSTATIN) 100000 UNIT/ML suspension Take 5 mLs (500,000 Units total) by mouth 4 (four) times daily.  240 mL  0  . oxyCODONE-acetaminophen (PERCOCET/ROXICET) 5-325 MG per tablet Take 1 tablet by mouth every 6 (six) hours as needed for pain.  60 tablet  0  . warfarin (COUMADIN) 4 MG tablet Take 1 tablet by mouth daily between 4 pm and 6 pm  30 tablet  0  . acyclovir (ZOVIRAX) 400 MG tablet Take 1 tablet (400 mg total) by mouth 2 (two) times daily.  10 tablet  0  . prochlorperazine (COMPAZINE) 10 MG tablet Take 1 tablet (10 mg total) by mouth every 6 (six) hours as needed.  60 tablet  0  . promethazine (PHENERGAN) 25 MG tablet Take 1 tablet (25 mg total) by mouth every 6 (six) hours as needed for nausea. Phenergan  25 mg tablets: Take 1 q 4 - 6 hours prn nausea.  Total 30 Tablets with 1 refill  60 tablet  0   No current facility-administered medications for this encounter.    Physical Findings: The patient is in no acute distress. Patient is alert and oriented.  height  is 5\' 5"  (1.651 m) and weight is 120 lb 1.6 oz (54.477 kg). Her temperature is 97.5 F (36.4 C). Her blood pressure is 155/92 and her pulse is 78. Her oxygen saturation is 98%. . No obvious neurologic focalities. She is wearing a wig. She does have some decubitus ulcers that are dry and erythematous and scaly, evidence of interval healing since hospitalization. No sign of infection. No residual oral thrush  Lab Findings: Lab Results  Component Value Date   WBC 16.7* 06/02/2012   HGB 11.7 06/02/2012   HCT 34.1* 06/02/2012   MCV 84.4 06/02/2012   PLT 67* 06/02/2012      Radiographic Findings: X-ray Chest Pa And Lateral   05/25/2012   *RADIOLOGY REPORT*  Clinical Data: Chest pain, cough, metastatic lung cancer  CHEST - 2 VIEW  Comparison: 04/16/2012 Correlation:  CT chest 04/16/2012  Findings:  Normal heart size and pulmonary vascularity. Right side aortic arch with atherosclerotic calcification. Left upper lobe scarring and volume loss with superior retraction of the left hilum and question hilar enlargement/adenopathy. Peripheral opacity in left upper lobe corresponds to the large mass with central cavitation identified on prior CT exam. Additional bilateral pulmonary nodules are identified including a large right right middle lobe mass 4.5 x 4.3 x 3.8 cm. No definite acute infiltrate, pleural effusion, or pneumothorax. Bones appear diffusely demineralized without obvious osseous metastasis.  IMPRESSION: Left upper lobe mass with upper lobe volume loss and probable left hilar adenopathy. Additional right middle lobe mass. Pulmonary nodules most likely representing pulmonary metastases. No acute infiltrate.   Original Report Authenticated By: Ulyses Southward, M.D.   Dg Swallowing Func-speech Pathology  05/27/2012   Breck Coons [place], CCC-SLP     05/27/2012  3:52 PM Objective Swallowing Evaluation: Modified Barium Swallowing Study   Patient Details  Name: Isabella Carpenter MRN: [id number] Date of Birth: [birth year]  Today's Date: 05/27/2012 Time: 4098-1191 SLP Time Calculation (min): 45 min  Past Medical History:  Past Medical History  Diagnosis Date  . Cancer   . Chicken pox   . GERD (gastroesophageal reflux disease)   . Hypertension   . High cholesterol   . Urine incontinence   . Kidney stone   . Lung cancer 05/08/2012   Past Surgical History:  Past Surgical History  Procedure Laterality Date  . Abdominal hysterectomy    . Axillary lymph node biopsy Left 04/18/2012    Procedure: AXILLARY LYMPH NODE BIOPSY;  Surgeon: Adolph Pollack, MD;  Location: [facility] ORS;  Service: General;   Laterality: Left;  left subclavian. need one hour   HPI:  67 year old female with past medical history of metastatic  non-small cell lung cancer, adenocarcinoma (diagnosed in April  2014), receiving systemic chemotherapy last cycle given  05/18/2012  who presented to [facility] 05/25/2012 with progressive dysphagia and  odynophagia.  Bedside swallow assessment revealed s/s aspiration  with MBS recommended.      Assessment / Plan / Recommendation Clinical Impression  Dysphagia Diagnosis: Moderate oral phase dysphagia;Moderate  pharyngeal phase dysphagia;Severe pharyngeal phase dysphagia Clinical impression: Pt. exhibited moderate oral dysphagia with  delayed oral manipulation and transit and prolonged  mastication/manipulation with small piece cracker.  Pharyngeal  phase includes motor impairments such as reduced tongue base  retraction, decreased laryngeal elevation, decreased  hyo-laryngeal anterior movement, poor epiglottic closure.   Deficits resulted in mod-max pharyngeal residue with aspiration  (silent) of pyriform sinsus residue at rest and during second  swallow in attempts to clear residue. Thin  liquids were aspirated  (cough x 1 out of multiple episodes) during and after swallow.   There was no significant difference related to aspiration events  between thin and nectar with nectar consistency resulting in  greater pharyngeal residue.  Chin tuck posture was an ineffective  strategy.  Puree texture resulted in only moderate residue  (perhaps due to a more moist consistency).  Given pt.'s suspected  poor prognosis with cancer and clinical findings during MBS  recommend pt. continue thin liquids and upgrade to puree Dys 1  texture.  SLP will follow closely and if pt. experiences  increased sensation with purees, SLP can downgrade to liquid  diet.  Swallow precautions include sit upright, small sips/bites,  swallow 2-3 times, volitional cough after every other bite/sip  and continued oral care.       Treatment Recommendation  Therapy as outlined in treatment plan below    Diet Recommendation Dysphagia 1 (Puree);Thin liquid   Liquid Administration via: Cup Medication Administration: Whole meds with puree Supervision: Patient able to self feed;Intermittent  supervision  to cue for compensatory strategies Compensations: Slow rate;Small sips/bites Postural Changes and/or Swallow Maneuvers: Seated upright 90  degrees    Other  Recommendations Oral Care Recommendations: Oral care BID   Follow Up Recommendations  None    Frequency and Duration min 2x/week  2 weeks   Pertinent Vitals/Pain none    SLP Swallow Goals Patient will utilize recommended strategies during swallow to  increase swallowing safety with: Moderate cueing   General HPI: 67 year old female with past medical history of  metastatic non-small cell lung cancer, adenocarcinoma (diagnosed  in April 2014), receiving systemic chemotherapy last cycle given  05/18/2012 who presented to [facility] 05/25/2012 with progressive dysphagia  and odynophagia.  Bedside swallow assessment revealed s/s  aspiration with MBS recommended.  Type of Study: Modified Barium Swallowing Study Reason for Referral: Objectively evaluate swallowing function Previous Swallow Assessment: N/A Diet Prior to this Study:  (full liquids) Temperature Spikes Noted: No Respiratory Status: Room air History of Recent Intubation: No Behavior/Cognition: Alert;Cooperative;Requires cueing Oral Cavity - Dentition: Adequate natural dentition Oral Motor / Sensory Function: Impaired - see Bedside swallow  eval Patient Positioning: Upright in chair Baseline Vocal Quality: Low vocal intensity;Hoarse Anatomy: Within functional limits Pharyngeal Secretions: Not observed secondary MBS    Reason for Referral Objectively evaluate swallowing function   Oral Phase Oral Preparation/Oral Phase Oral Phase: Impaired Oral - Nectar Oral - Nectar Cup: Delayed oral transit (minimal) Oral - Solids Oral - Puree: Delayed oral transit;Weak lingual manipulation Oral - Regular: Delayed oral transit;Weak lingual  manipulation;Impaired mastication;Reduced posterior propulsion   Pharyngeal Phase Pharyngeal Phase Pharyngeal Phase: Impaired Pharyngeal - Nectar Pharyngeal - Nectar Teaspoon:  Pharyngeal residue -  valleculae;Pharyngeal residue - pyriform sinuses;Reduced  laryngeal elevation;Reduced tongue base  retraction;Penetration/Aspiration during swallow (penetrate  during 2nd swallow) Penetration/Aspiration details (nectar teaspoon): Material enters  airway, CONTACTS cords and not ejected out Pharyngeal - Nectar Cup: Pharyngeal residue - pyriform  sinuses;Pharyngeal residue - valleculae;Reduced tongue base  retraction;Reduced laryngeal elevation;Penetration/Aspiration  during swallow Penetration/Aspiration details (nectar cup): Material enters  airway, remains ABOVE vocal cords and not ejected out Pharyngeal - Thin Pharyngeal - Thin Cup: Penetration/Aspiration during  swallow;Reduced laryngeal elevation;Reduced airway/laryngeal  closure;Pharyngeal residue - valleculae;Pharyngeal residue -  pyriform sinuses;Reduced tongue base retraction;Reduced anterior  laryngeal mobility;Penetration/Aspiration after swallow (4  immediate consecutive swallows to clear pharynx) Penetration/Aspiration details (thin cup): Material enters  airway, remains ABOVE vocal cords then ejected out;Material  enters  airway, passes BELOW cords without attempt by patient to  eject out (silent aspiration) Pharyngeal - Solids Pharyngeal - Puree: Pharyngeal residue - valleculae;Reduced  tongue base retraction Pharyngeal - Regular: Pharyngeal residue - valleculae;Reduced  tongue base retraction;Pharyngeal residue - pyriform  sinuses;Reduced laryngeal elevation  Cervical Esophageal Phase    GO    Cervical Esophageal Phase Cervical Esophageal Phase: [facility]         Darrow Bussing.Ed CCC-SLP Pager [phone]  05/27/2012    Impression/Plan: Doing relatively well. I told her to continue Nystatin until she she finishes that, as instructed by inpatient medical staff.  Reinstituted the patient's Decadron taper as follows: Decadron Taper Take 4mg  twice daily from today through June 2nd.  Take 4mg  once daily from June 3rd through June  12th.  Take 2mg  (half tablet) once daily from June 13th through June 22nd.  From June 23 through July 1, take 2mg  (half tablet) every other day.  I will see her back in 2 months with an MRI of her brain at that time. The patient and her daughter were encouraged to call if they have any questions or concerns in the interim.  Nursing provided dressings for the patient to use at home to heal her decubitus ulcers.  I spent 25 minutes minutes face to face with the patient and more than 50% of that time was spent in counseling and/or coordination of care. _____________________________________   Lonie Peak, MD

## 2012-06-07 ENCOUNTER — Telehealth: Payer: Self-pay | Admitting: Dietician

## 2012-06-07 NOTE — Telephone Encounter (Signed)
Brief Outpatient Oncology Nutrition Note  Patient has been identified to be at risk on malnutrition screen.  Wt Readings from Last 10 Encounters:  06/06/12 120 lb 1.6 oz (54.477 kg)  06/02/12 126 lb 11.2 oz (57.471 kg)  05/30/12 126 lb 4.8 oz (57.289 kg)  05/25/12 124 lb 1.6 oz (56.291 kg)  05/08/12 133 lb 9.6 oz (60.601 kg)  05/01/12 129 lb 14.4 oz (58.922 kg)  04/25/12 127 lb (57.607 kg)  04/24/12 130 lb 3.2 oz (59.058 kg)  04/19/12 131 lb 6.3 oz (59.6 kg)  04/19/12 131 lb 6.3 oz (59.6 kg)   Patient with metastatic non-small cell cancer to brain.  Now with sores on her sacrum.  Called and spoke with patient's daughter who reported patient with problems with constipation for the last 4-5 days.  Is not refusing Murelax.  Daughter has given patient an enema today with no results.  Intake is not adequate and weight loss as above.  Will take Ensure at times.  Discussed tips to prevent constipation and improving intake for wound healing and weight maintenance.    Will mail patient information of constipation prevention, increasing calories and protein, coupons for Ensure, and Outpatient Cancer Center contact information.  They are to call for any questions.  Oran Rein, RD, LDN

## 2012-06-08 ENCOUNTER — Ambulatory Visit (HOSPITAL_BASED_OUTPATIENT_CLINIC_OR_DEPARTMENT_OTHER): Payer: Commercial Managed Care - PPO | Admitting: Internal Medicine

## 2012-06-08 ENCOUNTER — Telehealth: Payer: Self-pay | Admitting: Pharmacist

## 2012-06-08 ENCOUNTER — Ambulatory Visit: Payer: Commercial Managed Care - PPO

## 2012-06-08 ENCOUNTER — Encounter: Payer: Self-pay | Admitting: Internal Medicine

## 2012-06-08 ENCOUNTER — Other Ambulatory Visit: Payer: Commercial Managed Care - PPO | Admitting: Lab

## 2012-06-08 ENCOUNTER — Ambulatory Visit: Payer: Commercial Managed Care - PPO | Admitting: Pharmacist

## 2012-06-08 ENCOUNTER — Encounter: Payer: Self-pay | Admitting: *Deleted

## 2012-06-08 ENCOUNTER — Other Ambulatory Visit (HOSPITAL_BASED_OUTPATIENT_CLINIC_OR_DEPARTMENT_OTHER): Payer: Commercial Managed Care - PPO | Admitting: Lab

## 2012-06-08 DIAGNOSIS — Z5181 Encounter for therapeutic drug level monitoring: Secondary | ICD-10-CM

## 2012-06-08 DIAGNOSIS — I82409 Acute embolism and thrombosis of unspecified deep veins of unspecified lower extremity: Secondary | ICD-10-CM

## 2012-06-08 DIAGNOSIS — K5900 Constipation, unspecified: Secondary | ICD-10-CM

## 2012-06-08 DIAGNOSIS — C3490 Malignant neoplasm of unspecified part of unspecified bronchus or lung: Secondary | ICD-10-CM

## 2012-06-08 DIAGNOSIS — Z7901 Long term (current) use of anticoagulants: Secondary | ICD-10-CM

## 2012-06-08 DIAGNOSIS — I82402 Acute embolism and thrombosis of unspecified deep veins of left lower extremity: Secondary | ICD-10-CM

## 2012-06-08 DIAGNOSIS — K1231 Oral mucositis (ulcerative) due to antineoplastic therapy: Secondary | ICD-10-CM

## 2012-06-08 LAB — CBC WITH DIFFERENTIAL/PLATELET
BASO%: 0.7 % (ref 0.0–2.0)
Eosinophils Absolute: 0.1 10*3/uL (ref 0.0–0.5)
HCT: 36.7 % (ref 34.8–46.6)
LYMPH%: 5.7 % — ABNORMAL LOW (ref 14.0–49.7)
MCHC: 34.3 g/dL (ref 31.5–36.0)
MONO#: 1.1 10*3/uL — ABNORMAL HIGH (ref 0.1–0.9)
NEUT%: 88.9 % — ABNORMAL HIGH (ref 38.4–76.8)
Platelets: 267 10*3/uL (ref 145–400)
WBC: 25 10*3/uL — ABNORMAL HIGH (ref 3.9–10.3)

## 2012-06-08 LAB — PROTIME-INR

## 2012-06-08 NOTE — Patient Instructions (Signed)
Will delay chemotherapy by one week. Followup visit in 2 weeks

## 2012-06-08 NOTE — Progress Notes (Unsigned)
Treatment cancelled today per Dr Mohamed. 

## 2012-06-08 NOTE — Telephone Encounter (Signed)
Left message at patients home and daughter, French Ana, to call us back to discuss dose and return appmt. She saw MD today, did not get treated and was gone from cancer center before I could see her in coumadin clinic.

## 2012-06-08 NOTE — Progress Notes (Signed)
[facility] [phone]   [phone]  OFFICE PROGRESS NOTE  [name], DO [address] [place] [address]  DIAGNOSIS: Metastatic non-small cell lung cancer, adenocarcinoma diagnosed in April 2014. EGFR mutation as well as ALK gene translocation are both negative.   PRIOR THERAPY: Status post whole brain irradiation under the care of Dr. Selina Cooley completed on 05/03/2012   CURRENT THERAPY: Systemic chemotherapy with carboplatin for AUC of 5 and Alimta 500 mg/M2 every 3 weeks status post 1 cycle given on 05/18/2012.   INTERVAL HISTORY: Isabella Carpenter 67 y.o. female returns to the clinic today for followup visit accompanied her daughter. The patient was recently admitted to [facility] with significant mucositis most likely secondary to chemotherapy with Alimta. She was also recently diagnosed with deep venous thrombosis and currently on treatment with Coumadin 4 mg by mouth daily. She is followed by the Coumadin clinic at the cancer Center. She still complaining of increasing fatigue and weakness. She also has constipation that's not responding to the home medications including an enema. She lost few pounds since her last visit. The patient denied having any significant chest pain but continues to have shortness breath with exertion, no cough or hemoptysis.  MEDICAL HISTORY: Past Medical History  Diagnosis Date  . Cancer   . Chicken pox   . GERD (gastroesophageal reflux disease)   . Hypertension   . High cholesterol   . Urine incontinence   . Kidney stone   . Lung cancer 05/08/2012  . Status post chemotherapy  05/18/2012    carboplatin for AUC of 5 and Alimta 500 mg/M2 every 3 weeks status post 1 cycle given on 05/18/2012  . S/P radiation therapy  04/20/2012-05/03/2012    Whole Brain / 30Gy/ 10 fractions    ALLERGIES:  has No Known Allergies.  MEDICATIONS:  Current Outpatient Prescriptions  Medication Sig Dispense Refill   . Alum & Mag Hydroxide-Simeth (MAGIC MOUTHWASH W/LIDOCAINE) SOLN Take 5 mLs by mouth 4 (four) times daily as needed.  100 mL  0  . dexamethasone (DECADRON) 4 MG tablet Take 4 mg by mouth 2 (two) times daily.      . folic acid (FOLVITE) 1 MG tablet Take 1 tablet (1 mg total) by mouth daily.  30 tablet  3  . morphine (MS CONTIN) 30 MG 12 hr tablet Take 1 tablet (30 mg total) by mouth 2 (two) times daily.  60 tablet  0  . Multiple Vitamin (MULTIVITAMIN) tablet Take 1 tablet by mouth daily.      Marland Kitchen nystatin (MYCOSTATIN) 100000 UNIT/ML suspension Take 5 mLs (500,000 Units total) by mouth 4 (four) times daily.  240 mL  0  . oxyCODONE-acetaminophen (PERCOCET/ROXICET) 5-325 MG per tablet Take 1 tablet by mouth every 6 (six) hours as needed for pain.  60 tablet  0  . polyethylene glycol (MIRALAX / GLYCOLAX) packet Take 17 g by mouth 2 (two) times daily.      . prochlorperazine (COMPAZINE) 10 MG tablet Take 1 tablet (10 mg total) by mouth every 6 (six) hours as needed.  60 tablet  0  . promethazine (PHENERGAN) 25 MG tablet Take 1 tablet (25 mg total) by mouth every 6 (six) hours as needed for nausea. Phenergan  25 mg tablets: Take 1 q 4 - 6 hours prn nausea.  Total 30 Tablets with 1 refill  60 tablet  0  . warfarin (COUMADIN) 4 MG tablet Take 1 tablet by  mouth daily between 4 pm and 6 pm  30 tablet  0   No current facility-administered medications for this visit.    SURGICAL HISTORY:  Past Surgical History  Procedure Laterality Date  . Abdominal hysterectomy    . Axillary lymph node biopsy Left 04/18/2012    Procedure: AXILLARY LYMPH NODE BIOPSY;  Surgeon: Adolph Pollack, MD;  Location: [facility];  Service: General;  Laterality: Left;  left subclavian. need one hour    REVIEW OF SYSTEMS:  A comprehensive review of systems was negative except for: Constitutional: positive for anorexia, fatigue and weight loss Respiratory: positive for dyspnea on exertion Gastrointestinal: positive for  constipation Musculoskeletal: positive for muscle weakness   PHYSICAL EXAMINATION: General appearance: alert, cooperative, fatigued and no distress Head: Normocephalic, without obvious abnormality, atraumatic Neck: no adenopathy Lymph nodes: Cervical, supraclavicular, and axillary nodes normal. Resp: clear to auscultation bilaterally Cardio: regular rate and rhythm, S1, S2 normal, no murmur, click, rub or Carpenter GI: soft, non-tender; bowel sounds normal; no masses,  no organomegaly Extremities: extremities normal, atraumatic, no cyanosis or edema Neurologic: Alert and oriented X 3, normal strength and tone. Normal symmetric reflexes. Normal coordination and gait  ECOG PERFORMANCE STATUS: 2 - Symptomatic, <50% confined to bed  Blood pressure 133/84, pulse 92, temperature 97.4 F (36.3 C), temperature source Oral, resp. rate 20, height 5\' 5"  (1.651 m), weight 119 lb 14.4 oz (54.386 kg).  LABORATORY DATA: Lab Results  Component Value Date   WBC 25.0* 06/08/2012   HGB 12.6 06/08/2012   HCT 36.7 06/08/2012   MCV 84.8 06/08/2012   PLT 267 06/08/2012      Chemistry      Component Value Date/Time   NA 136 06/02/2012 1008   NA 131* 05/29/2012 0403   K 4.1 06/02/2012 1008   K 3.4* 05/29/2012 0403   CL 101 06/02/2012 1008   CL 97 05/29/2012 0403   CO2 25 06/02/2012 1008   CO2 26 05/29/2012 0403   BUN 11.6 06/02/2012 1008   BUN 5* 05/29/2012 0403   CREATININE 0.7 06/02/2012 1008   CREATININE 0.42* 05/29/2012 0403      Component Value Date/Time   CALCIUM 8.7 06/02/2012 1008   CALCIUM 8.0* 05/29/2012 0403   ALKPHOS 98 06/02/2012 1008   ALKPHOS 52 05/26/2012 0349   AST 31 06/02/2012 1008   AST 21 05/26/2012 0349   ALT 71* 06/02/2012 1008   ALT 58* 05/26/2012 0349   BILITOT 0.40 06/02/2012 1008   BILITOT 0.9 05/26/2012 0349       RADIOGRAPHIC STUDIES: X-ray Chest Pa And Lateral   05/25/2012   *RADIOLOGY REPORT*  Clinical Data: Chest pain, cough, metastatic lung cancer  CHEST - 2 VIEW  Comparison:  04/16/2012 Correlation:  CT chest 04/16/2012  Findings: Normal heart size and pulmonary vascularity. Right side aortic arch with atherosclerotic calcification. Left upper lobe scarring and volume loss with superior retraction of the left hilum and question hilar enlargement/adenopathy. Peripheral opacity in left upper lobe corresponds to the large mass with central cavitation identified on prior CT exam. Additional bilateral pulmonary nodules are identified including a large right right middle lobe mass 4.5 x 4.3 x 3.8 cm. No definite acute infiltrate, pleural effusion, or pneumothorax. Bones appear diffusely demineralized without obvious osseous metastasis.  IMPRESSION: Left upper lobe mass with upper lobe volume loss and probable left hilar adenopathy. Additional right middle lobe mass. Pulmonary nodules most likely representing pulmonary metastases. No acute infiltrate.   Original Report Authenticated By: Loraine Leriche  Tyron Russell, M.D.   Dg Swallowing Func-speech Pathology  05/27/2012   Breck Coons Priddy, CCC-SLP     05/27/2012  3:52 PM Objective Swallowing Evaluation: Modified Barium Swallowing Study   Patient Details  Name: Isabella Carpenter MRN: [id number] Date of Birth: November 16, 1945  Today's Date: 05/27/2012 Time: 5621-3086 SLP Time Calculation (min): 45 min  Past Medical History:  Past Medical History  Diagnosis Date  . Cancer   . Chicken pox   . GERD (gastroesophageal reflux disease)   . Hypertension   . High cholesterol   . Urine incontinence   . Kidney stone   . Lung cancer 05/08/2012   Past Surgical History:  Past Surgical History  Procedure Laterality Date  . Abdominal hysterectomy    . Axillary lymph node biopsy Left 04/18/2012    Procedure: AXILLARY LYMPH NODE BIOPSY;  Surgeon: Adolph Pollack, MD;  Location: [facility];  Service: General;   Laterality: Left;  left subclavian. need one hour   HPI:  67 year old female with past medical history of metastatic  non-small cell lung cancer, adenocarcinoma (diagnosed in April   2014), receiving systemic chemotherapy last cycle given 05/18/2012  who presented to [facility] 05/25/2012 with progressive dysphagia and  odynophagia.  Bedside swallow assessment revealed s/s aspiration  with MBS recommended.      Assessment / Plan / Recommendation Clinical Impression  Dysphagia Diagnosis: Moderate oral phase dysphagia;Moderate  pharyngeal phase dysphagia;Severe pharyngeal phase dysphagia Clinical impression: Pt. exhibited moderate oral dysphagia with  delayed oral manipulation and transit and prolonged  mastication/manipulation with small piece cracker.  Pharyngeal  phase includes motor impairments such as reduced tongue base  retraction, decreased laryngeal elevation, decreased  hyo-laryngeal anterior movement, poor epiglottic closure.   Deficits resulted in mod-max pharyngeal residue with aspiration  (silent) of pyriform sinsus residue at rest and during second  swallow in attempts to clear residue. Thin liquids were aspirated  (cough x 1 out of multiple episodes) during and after swallow.   There was no significant difference related to aspiration events  between thin and nectar with nectar consistency resulting in  greater pharyngeal residue.  Chin tuck posture was an ineffective  strategy.  Puree texture resulted in only moderate residue  (perhaps due to a more moist consistency).  Given pt.'s suspected  poor prognosis with cancer and clinical findings during MBS  recommend pt. continue thin liquids and upgrade to puree Dys 1  texture.  SLP will follow closely and if pt. experiences  increased sensation with purees, SLP can downgrade to liquid  diet.  Swallow precautions include sit upright, small sips/bites,  swallow 2-3 times, volitional cough after every other bite/sip  and continued oral care.       Treatment Recommendation  Therapy as outlined in treatment plan below    Diet Recommendation Dysphagia 1 (Puree);Thin liquid   Liquid Administration via: Cup Medication Administration: Whole meds with  puree Supervision: Patient able to self feed;Intermittent supervision  to cue for compensatory strategies Compensations: Slow rate;Small sips/bites Postural Changes and/or Swallow Maneuvers: Seated upright 90  degrees    Other  Recommendations Oral Care Recommendations: Oral care BID   Follow Up Recommendations  None    Frequency and Duration min 2x/week  2 weeks   Pertinent Vitals/Pain none    SLP Swallow Goals Patient will utilize recommended strategies during swallow to  increase swallowing safety with: Moderate cueing   General HPI: 67 year old female with past medical history of  metastatic non-small cell  lung cancer, adenocarcinoma (diagnosed  in April 2014), receiving systemic chemotherapy last cycle given  05/18/2012 who presented to [facility] 05/25/2012 with progressive dysphagia  and odynophagia.  Bedside swallow assessment revealed s/s  aspiration with MBS recommended.  Type of Study: Modified Barium Swallowing Study Reason for Referral: Objectively evaluate swallowing function Previous Swallow Assessment: N/A Diet Prior to this Study:  (full liquids) Temperature Spikes Noted: No Respiratory Status: Room air History of Recent Intubation: No Behavior/Cognition: Alert;Cooperative;Requires cueing Oral Cavity - Dentition: Adequate natural dentition Oral Motor / Sensory Function: Impaired - see Bedside swallow  eval Patient Positioning: Upright in chair Baseline Vocal Quality: Low vocal intensity;Hoarse Anatomy: Within functional limits Pharyngeal Secretions: Not observed secondary MBS    Reason for Referral Objectively evaluate swallowing function   Oral Phase Oral Preparation/Oral Phase Oral Phase: Impaired Oral - Nectar Oral - Nectar Cup: Delayed oral transit (minimal) Oral - Solids Oral - Puree: Delayed oral transit;Weak lingual manipulation Oral - Regular: Delayed oral transit;Weak lingual  manipulation;Impaired mastication;Reduced posterior propulsion   Pharyngeal Phase Pharyngeal Phase Pharyngeal Phase: Impaired  Pharyngeal - Nectar Pharyngeal - Nectar Teaspoon: Pharyngeal residue -  valleculae;Pharyngeal residue - pyriform sinuses;Reduced  laryngeal elevation;Reduced tongue base  retraction;Penetration/Aspiration during swallow (penetrate  during 2nd swallow) Penetration/Aspiration details (nectar teaspoon): Material enters  airway, CONTACTS cords and not ejected out Pharyngeal - Nectar Cup: Pharyngeal residue - pyriform  sinuses;Pharyngeal residue - valleculae;Reduced tongue base  retraction;Reduced laryngeal elevation;Penetration/Aspiration  during swallow Penetration/Aspiration details (nectar cup): Material enters  airway, remains ABOVE vocal cords and not ejected out Pharyngeal - Thin Pharyngeal - Thin Cup: Penetration/Aspiration during  swallow;Reduced laryngeal elevation;Reduced airway/laryngeal  closure;Pharyngeal residue - valleculae;Pharyngeal residue -  pyriform sinuses;Reduced tongue base retraction;Reduced anterior  laryngeal mobility;Penetration/Aspiration after swallow (4  immediate consecutive swallows to clear pharynx) Penetration/Aspiration details (thin cup): Material enters  airway, remains ABOVE vocal cords then ejected out;Material  enters airway, passes BELOW cords without attempt by patient to  eject out (silent aspiration) Pharyngeal - Solids Pharyngeal - Puree: Pharyngeal residue - valleculae;Reduced  tongue base retraction Pharyngeal - Regular: Pharyngeal residue - valleculae;Reduced  tongue base retraction;Pharyngeal residue - pyriform  sinuses;Reduced laryngeal elevation  Cervical Esophageal Phase    GO    Cervical Esophageal Phase Cervical Esophageal Phase: [facility]         Darrow Bussing.Ed CCC-SLP Pager [phone]  05/27/2012    ASSESSMENT: This is a very pleasant 67 years old white female with metastatic non-small cell lung cancer, adenocarcinoma currently undergoing systemic chemotherapy with carboplatin and Alimta status post 1 cycle.   PLAN: His treatment was complicated with severe  mucositis. I would reduce the dose of Alimta to 375 MG/M2 starting from cycle #2. The patient is not feeling well today, I would delay her chemotherapy started next week. For lack of appetite and depression, I will start the patient on Remeron 30 mg by mouth each bedtime. For constipation she was advised to use magnesium citrate and to call us if no improvement. The patient would come back for followup visit in 2 weeks for evaluation and management any adverse effect of her chemotherapy.  All questions were answered. The patient knows to call the clinic with any problems, questions or concerns. We can certainly see the patient much sooner if necessary.  I spent 15 minutes counseling the patient face to face. The total time spent in the appointment was 25 minutes.

## 2012-06-09 ENCOUNTER — Telehealth: Payer: Self-pay | Admitting: Internal Medicine

## 2012-06-09 ENCOUNTER — Telehealth: Payer: Self-pay | Admitting: *Deleted

## 2012-06-09 NOTE — Telephone Encounter (Signed)
lvm for pt regarding to move appt to 6.6.14

## 2012-06-09 NOTE — Telephone Encounter (Signed)
s.w. pt and advised on 6.5.14 appt....pt will pick up new sched that day...pt ok and awre

## 2012-06-09 NOTE — Telephone Encounter (Signed)
Per staff message and POF I have scheduled appts.  JMW  

## 2012-06-12 ENCOUNTER — Telehealth: Payer: Self-pay | Admitting: Pharmacist

## 2012-06-12 ENCOUNTER — Other Ambulatory Visit: Payer: Self-pay | Admitting: *Deleted

## 2012-06-12 MED ORDER — MIRTAZAPINE 30 MG PO TABS: 30.0000 mg | ORAL_TABLET | Freq: Every day | ORAL | Status: DC

## 2012-06-12 NOTE — Telephone Encounter (Signed)
Pt daughter French Ana returned phone call from missed CC appmt last week.  Pt did see MD, but did not get treated and she missed clinic appmt.  She is on 4 mg daily with an INR 2.7.  She stopped lovenox last Wed (5/28).  She will return for labs and infusion on Fri, June 6.  We will see her during infusion.  She knows if infusion is cancelled because of labs she is to let them know we still need to see her in coumadin clinic. Daughter was inquiring about "new med MD said he would call in to help increase her appetite."  Per his note it was Remeron. French Ana stated it should be called [facility] [place] and not [facility].  Relayed this message to [place] and she will call it in to [facility].

## 2012-06-13 ENCOUNTER — Other Ambulatory Visit: Payer: Self-pay | Admitting: Internal Medicine

## 2012-06-14 ENCOUNTER — Ambulatory Visit: Payer: Commercial Managed Care - PPO | Admitting: Family Medicine

## 2012-06-15 ENCOUNTER — Other Ambulatory Visit: Payer: Commercial Managed Care - PPO | Admitting: Lab

## 2012-06-16 ENCOUNTER — Other Ambulatory Visit: Payer: Self-pay | Admitting: Radiation Therapy

## 2012-06-16 ENCOUNTER — Ambulatory Visit: Payer: Commercial Managed Care - PPO | Admitting: Pharmacist

## 2012-06-16 ENCOUNTER — Ambulatory Visit (HOSPITAL_BASED_OUTPATIENT_CLINIC_OR_DEPARTMENT_OTHER): Payer: Commercial Managed Care - PPO

## 2012-06-16 ENCOUNTER — Other Ambulatory Visit (HOSPITAL_BASED_OUTPATIENT_CLINIC_OR_DEPARTMENT_OTHER): Payer: Commercial Managed Care - PPO | Admitting: Lab

## 2012-06-16 DIAGNOSIS — C3430 Malignant neoplasm of lower lobe, unspecified bronchus or lung: Secondary | ICD-10-CM

## 2012-06-16 DIAGNOSIS — C7931 Secondary malignant neoplasm of brain: Secondary | ICD-10-CM

## 2012-06-16 DIAGNOSIS — I82409 Acute embolism and thrombosis of unspecified deep veins of unspecified lower extremity: Secondary | ICD-10-CM

## 2012-06-16 DIAGNOSIS — I82402 Acute embolism and thrombosis of unspecified deep veins of left lower extremity: Secondary | ICD-10-CM

## 2012-06-16 DIAGNOSIS — Z5111 Encounter for antineoplastic chemotherapy: Secondary | ICD-10-CM

## 2012-06-16 LAB — CBC WITH DIFFERENTIAL/PLATELET
BASO%: 0.3 % (ref 0.0–2.0)
LYMPH%: 6.4 % — ABNORMAL LOW (ref 14.0–49.7)
MCHC: 34.3 g/dL (ref 31.5–36.0)
MONO#: 1.1 10*3/uL — ABNORMAL HIGH (ref 0.1–0.9)
Platelets: 388 10*3/uL (ref 145–400)
RBC: 4 10*6/uL (ref 3.70–5.45)
RDW: 17.5 % — ABNORMAL HIGH (ref 11.2–14.5)
WBC: 22.8 10*3/uL — ABNORMAL HIGH (ref 3.9–10.3)
lymph#: 1.5 10*3/uL (ref 0.9–3.3)
nRBC: 1 % — ABNORMAL HIGH (ref 0–0)

## 2012-06-16 LAB — COMPREHENSIVE METABOLIC PANEL (CC13)
ALT: 37 U/L (ref 0–55)
CO2: 21 mEq/L — ABNORMAL LOW (ref 22–29)
Creatinine: 0.6 mg/dL (ref 0.6–1.1)
Glucose: 120 mg/dl — ABNORMAL HIGH (ref 70–99)
Total Bilirubin: 0.38 mg/dL (ref 0.20–1.20)

## 2012-06-16 LAB — PROTIME-INR: Protime: 56.4 Seconds — ABNORMAL HIGH (ref 10.6–13.4)

## 2012-06-16 MED ORDER — SODIUM CHLORIDE 0.9 % IV SOLN
375.0000 mg/m2 | Freq: Once | INTRAVENOUS | Status: AC
  Administered 2012-06-16: 625 mg via INTRAVENOUS
  Filled 2012-06-16: qty 25

## 2012-06-16 MED ORDER — SODIUM CHLORIDE 0.9 % IV SOLN
Freq: Once | INTRAVENOUS | Status: AC
  Administered 2012-06-16: 10:00:00 via INTRAVENOUS

## 2012-06-16 MED ORDER — SODIUM CHLORIDE 0.9 % IV SOLN
480.0000 mg | Freq: Once | INTRAVENOUS | Status: AC
  Administered 2012-06-16: 480 mg via INTRAVENOUS
  Filled 2012-06-16: qty 48

## 2012-06-16 MED ORDER — ONDANSETRON 16 MG/50ML IVPB (CHCC)
16.0000 mg | Freq: Once | INTRAVENOUS | Status: AC
  Administered 2012-06-16: 16 mg via INTRAVENOUS

## 2012-06-16 MED ORDER — DEXAMETHASONE SODIUM PHOSPHATE 20 MG/5ML IJ SOLN
20.0000 mg | Freq: Once | INTRAMUSCULAR | Status: AC
  Administered 2012-06-16: 20 mg via INTRAVENOUS

## 2012-06-16 NOTE — Patient Instructions (Signed)
Hold coumadin today (06/16/12) and tomorrow (06/17/12).  Take 4mg  on 06/18/12, 2mg  on 06/19/12, 4mg  on 06/20/12 and 2mg  on 06/21/12.   Will recheck INR in 1 week on 06/22/12: lab at 11:45am, Dr. Arbutus Ped at 12:15pm and coumadin clinic at 12:30pm.

## 2012-06-16 NOTE — Patient Instructions (Signed)
[facility] Discharge Instructions for Patients Receiving Chemotherapy  Today you received the following chemotherapy agents carboplating and alimta  To help prevent nausea and vomiting after your treatment, we encourage you to take your nausea medication as needed  If you develop nausea and vomiting that is not controlled by your nausea medication, call the clinic.   BELOW ARE SYMPTOMS THAT SHOULD BE REPORTED IMMEDIATELY:  *FEVER GREATER THAN 100.5 F  *CHILLS WITH OR WITHOUT FEVER  NAUSEA AND VOMITING THAT IS NOT CONTROLLED WITH YOUR NAUSEA MEDICATION  *UNUSUAL SHORTNESS OF BREATH  *UNUSUAL BRUISING OR BLEEDING  TENDERNESS IN MOUTH AND THROAT WITH OR WITHOUT PRESENCE OF ULCERS  *URINARY PROBLEMS  *BOWEL PROBLEMS  UNUSUAL RASH Items with * indicate a potential emergency and should be followed up as soon as possible.  Feel free to call the clinic you have any questions or concerns. The clinic phone number is [phone].

## 2012-06-16 NOTE — Progress Notes (Signed)
INR above goal today. Likely due to interaction with Diflucan as well as decreased warfarin requirements due to weight, diet intake and albumin level. She was on Diflucan and Coumadin concomitantly from ~ 5/24 - 5/29. Pt was on Diflucan for ~14 days from 05/25/12 - 06/08/12. It was started during her hospitalization. Mucositis has resolved per pt report. Remeron recently added to assist with weight gain and appetite. Pt remains on dexamethasone. Decreased albumin (Albumin = 2.4 today). Some bruising noted from IV sticks from hospitalization. Healing. No other bruising noted. Pt coughed up sputum a few times last night that contained blood. Brown and red spots. Dr. Arbutus Ped aware and attributes it to her elevated INR. No other bleeding noted. No s/s of clotting. A full discussion of the nature of anticoagulants has been carried out.  The need for frequent and regular monitoring, precise dosage adjustment and compliance is stressed. Side effects of potential bleeding are discussed.  Drug-drug interactions and drug-food interactions have been discussed.  We reviewed the importance of consistency with Vitamin K intake. Pt does not like greens and doesn't usually eat them.  Warfarin education sheets have been provided to pt. I informed her to avoid great swings in general diet.  She does not drink alchohol. Pt does not use any herbal supplements. Pt daughter was with for the visit. She sets up the medications for pt. Hold coumadin today (06/16/12) and tomorrow (06/17/12).  Take 4mg  on 06/18/12, 2mg  on 06/19/12, 4mg  on 06/20/12 and 2mg  on 06/21/12.  Will recheck INR in 1 week on 06/22/12: lab at 11:45am, Dr. Arbutus Ped at 12:15pm and coumadin clinic at 12:30pm. Pt daughter stated understanding of new coumadin instructions. Dr. Arbutus Ped agrees with plan.

## 2012-06-20 ENCOUNTER — Telehealth: Payer: Self-pay | Admitting: Dietician

## 2012-06-22 ENCOUNTER — Telehealth: Payer: Self-pay | Admitting: *Deleted

## 2012-06-22 ENCOUNTER — Encounter: Payer: Self-pay | Admitting: Internal Medicine

## 2012-06-22 ENCOUNTER — Ambulatory Visit: Payer: Commercial Managed Care - PPO | Admitting: Pharmacist

## 2012-06-22 ENCOUNTER — Ambulatory Visit (HOSPITAL_BASED_OUTPATIENT_CLINIC_OR_DEPARTMENT_OTHER): Payer: Commercial Managed Care - PPO | Admitting: Internal Medicine

## 2012-06-22 ENCOUNTER — Telehealth: Payer: Self-pay | Admitting: Internal Medicine

## 2012-06-22 ENCOUNTER — Other Ambulatory Visit (HOSPITAL_BASED_OUTPATIENT_CLINIC_OR_DEPARTMENT_OTHER): Payer: Commercial Managed Care - PPO | Admitting: Lab

## 2012-06-22 DIAGNOSIS — I82409 Acute embolism and thrombosis of unspecified deep veins of unspecified lower extremity: Secondary | ICD-10-CM

## 2012-06-22 DIAGNOSIS — I82402 Acute embolism and thrombosis of unspecified deep veins of left lower extremity: Secondary | ICD-10-CM

## 2012-06-22 DIAGNOSIS — C7949 Secondary malignant neoplasm of other parts of nervous system: Secondary | ICD-10-CM

## 2012-06-22 DIAGNOSIS — R5381 Other malaise: Secondary | ICD-10-CM

## 2012-06-22 DIAGNOSIS — C3430 Malignant neoplasm of lower lobe, unspecified bronchus or lung: Secondary | ICD-10-CM

## 2012-06-22 LAB — COMPREHENSIVE METABOLIC PANEL (CC13)
Albumin: 2.6 g/dL — ABNORMAL LOW (ref 3.5–5.0)
CO2: 22 mEq/L (ref 22–29)
Calcium: 8.6 mg/dL (ref 8.4–10.4)
Glucose: 163 mg/dl — ABNORMAL HIGH (ref 70–99)
Potassium: 4.3 mEq/L (ref 3.5–5.1)
Sodium: 134 mEq/L — ABNORMAL LOW (ref 136–145)
Total Protein: 5.9 g/dL — ABNORMAL LOW (ref 6.4–8.3)

## 2012-06-22 LAB — CBC WITH DIFFERENTIAL/PLATELET
Eosinophils Absolute: 0 10*3/uL (ref 0.0–0.5)
HCT: 32.6 % — ABNORMAL LOW (ref 34.8–46.6)
LYMPH%: 7.4 % — ABNORMAL LOW (ref 14.0–49.7)
MCHC: 35.3 g/dL (ref 31.5–36.0)
MCV: 85.2 fL (ref 79.5–101.0)
MONO#: 0 10*3/uL — ABNORMAL LOW (ref 0.1–0.9)
MONO%: 0.3 % (ref 0.0–14.0)
NEUT%: 91.4 % — ABNORMAL HIGH (ref 38.4–76.8)
Platelets: 208 10*3/uL (ref 145–400)
WBC: 5.2 10*3/uL (ref 3.9–10.3)

## 2012-06-22 NOTE — Progress Notes (Signed)
[facility] [phone]   [phone]  OFFICE PROGRESS NOTE  [name], DO [address] [place] [address]  DIAGNOSIS: Metastatic non-small cell lung cancer, adenocarcinoma diagnosed in April 2014. EGFR mutation as well as ALK gene translocation are both negative.   PRIOR THERAPY: Status post whole brain irradiation under the care of Dr. Selina Cooley completed on 05/03/2012   CURRENT THERAPY: Systemic chemotherapy with carboplatin for AUC of 5 and Alimta 375 mg/M2 every 3 weeks status post 2 cycles, first cycle was given on 05/18/2012.  INTERVAL HISTORY: Isabella Carpenter 67 y.o. female returns to the clinic today for follow up visit accompanied by her daughter. The patient tolerated the second cycle of her systemic chemotherapy fairly well especially after reducing the dose of Alimta to 375 MG/M2. She continues to complain of mild fatigue. She denied having any significant chest pain, shortness of breath, cough or hemoptysis. The patient is still on a tapered dose of Decadron 4 mg by mouth twice a day and complaining of swelling secondary to her steroid treatment.   MEDICAL HISTORY: Past Medical History  Diagnosis Date  . Cancer   . Chicken pox   . GERD (gastroesophageal reflux disease)   . Hypertension   . High cholesterol   . Urine incontinence   . Kidney stone   . Lung cancer 05/08/2012  . Status post chemotherapy  05/18/2012    carboplatin for AUC of 5 and Alimta 500 mg/M2 every 3 weeks status post 1 cycle given on 05/18/2012  . S/P radiation therapy  04/20/2012-05/03/2012    Whole Brain / 30Gy/ 10 fractions    ALLERGIES:  has No Known Allergies.  MEDICATIONS:  Current Outpatient Prescriptions  Medication Sig Dispense Refill  . dexamethasone (DECADRON) 4 MG tablet Take 4 mg by mouth 2 (two) times daily.      . folic acid (FOLVITE) 1 MG tablet Take 1 tablet (1 mg total) by mouth daily.  30 tablet  3  . mirtazapine (REMERON) 30  MG tablet Take 1 tablet (30 mg total) by mouth at bedtime.  30 tablet  0  . morphine (MS CONTIN) 30 MG 12 hr tablet Take 1 tablet (30 mg total) by mouth 2 (two) times daily.  60 tablet  0  . Multiple Vitamin (MULTIVITAMIN) tablet Take 1 tablet by mouth daily.      Marland Kitchen nystatin (MYCOSTATIN) 100000 UNIT/ML suspension Take 5 mLs (500,000 Units total) by mouth 4 (four) times daily.  240 mL  0  . polyethylene glycol (MIRALAX / GLYCOLAX) packet Take 17 g by mouth 2 (two) times daily.      . prochlorperazine (COMPAZINE) 10 MG tablet Take 1 tablet (10 mg total) by mouth every 6 (six) hours as needed.  60 tablet  0  . warfarin (COUMADIN) 4 MG tablet Take 1 tablet by mouth daily between 4 pm and 6 pm  30 tablet  0  . Alum & Mag Hydroxide-Simeth (MAGIC MOUTHWASH W/LIDOCAINE) SOLN Take 5 mLs by mouth 4 (four) times daily as needed.  100 mL  0  . oxyCODONE-acetaminophen (PERCOCET/ROXICET) 5-325 MG per tablet Take 1 tablet by mouth every 6 (six) hours as needed for pain.  60 tablet  0  . promethazine (PHENERGAN) 25 MG tablet Take 1 tablet (25 mg total) by mouth every 6 (six) hours as needed for nausea. Phenergan  25 mg tablets: Take 1 q 4 - 6 hours prn nausea.  Total 30 Tablets  with 1 refill  60 tablet  0   No current facility-administered medications for this visit.    SURGICAL HISTORY:  Past Surgical History  Procedure Laterality Date  . Abdominal hysterectomy    . Axillary lymph node biopsy Left 04/18/2012    Procedure: AXILLARY LYMPH NODE BIOPSY;  Surgeon: Adolph Pollack, MD;  Location: [facility];  Service: General;  Laterality: Left;  left subclavian. need one hour    REVIEW OF SYSTEMS:  A comprehensive review of systems was negative except for: Constitutional: positive for fatigue   PHYSICAL EXAMINATION: General appearance: alert, cooperative, fatigued and no distress Head: Normocephalic, without obvious abnormality, atraumatic Neck: no adenopathy Lymph nodes: Cervical, supraclavicular, and axillary  nodes normal. Resp: clear to auscultation bilaterally Cardio: regular rate and rhythm, S1, S2 normal, no murmur, click, rub or gallop GI: soft, non-tender; bowel sounds normal; no masses,  no organomegaly Extremities: extremities normal, atraumatic, no cyanosis or edema  ECOG PERFORMANCE STATUS: 1 - Symptomatic but completely ambulatory  Blood pressure 123/55, pulse 67, temperature 97.6 F (36.4 C), temperature source Oral, resp. rate 17, height 5\' 5"  (1.651 m), weight 148 lb 9.6 oz (67.405 kg).  LABORATORY DATA: Lab Results  Component Value Date   WBC 5.2 06/22/2012   HGB 11.5* 06/22/2012   HCT 32.6* 06/22/2012   MCV 85.2 06/22/2012   PLT 208 06/22/2012      Chemistry      Component Value Date/Time   NA 135* 06/16/2012 0902   NA 131* 05/29/2012 0403   K 3.6 06/16/2012 0902   K 3.4* 05/29/2012 0403   CL 105 06/16/2012 0902   CL 97 05/29/2012 0403   CO2 21* 06/16/2012 0902   CO2 26 05/29/2012 0403   BUN 13.5 06/16/2012 0902   BUN 5* 05/29/2012 0403   CREATININE 0.6 06/16/2012 0902   CREATININE 0.42* 05/29/2012 0403      Component Value Date/Time   CALCIUM 8.5 06/16/2012 0902   CALCIUM 8.0* 05/29/2012 0403   ALKPHOS 80 06/16/2012 0902   ALKPHOS 52 05/26/2012 0349   AST 14 06/16/2012 0902   AST 21 05/26/2012 0349   ALT 37 06/16/2012 0902   ALT 58* 05/26/2012 0349   BILITOT 0.38 06/16/2012 0902   BILITOT 0.9 05/26/2012 0349       ASSESSMENT AND PLAN: this is a very pleasant 67 years old white female with metastatic non-small cell lung cancer, adenocarcinoma, currently undergoing systemic chemotherapy with carboplatin and reduced dose Alimta.  The patient tolerated the second cycle of her systemic chemotherapy fairly well with no significant adverse effect except for the fatigue. I recommended for her to proceed with the third cycle of chemotherapy today as scheduled. I will arrange for the patient to have repeat CT scan of the chest, abdomen and pelvis for restaging of her disease.Marland Kitchen She was advised to  start tapering her Decadron dose to 2 mg by mouth twice a day for the next 2 weeks, then 2 mg by mouth daily for 2 more weeks. I would see the patient back for follow up visit in 3 weeks before starting cycle #4 She was advised to call immediately if she has any concerning symptoms in the interval.  All questions were answered. The patient knows to call the clinic with any problems, questions or concerns. We can certainly see the patient much sooner if necessary.

## 2012-06-22 NOTE — Telephone Encounter (Signed)
Per staff message and POF I have scheduled appts.  JMW  

## 2012-06-22 NOTE — Progress Notes (Signed)
Pt states she is smoking 4-5 cigarettes/day.

## 2012-06-22 NOTE — Progress Notes (Signed)
INR below goal today secondary to holding coumadin. Pt has taken coumadin as instructed at last visit. No problems to report. No further blood noted in sputum. No s/s of cotting. No other changes to report in diet, medications, etc. Continue 4mg  (1 tablet) daily except 2mg  (1/2 tablet) on Mon&Wed.  Recheck INR with next scheduled lab appt in 1 week on 06/29/12: lab at 10am and coumadin clinic at 10:15am.

## 2012-06-22 NOTE — Patient Instructions (Addendum)
Continue chemotherapy today as scheduled.  Followup visit in 3 weeks with repeat CT scan of the chest, abdomen and pelvis. 

## 2012-06-22 NOTE — Telephone Encounter (Signed)
GV AND PRINTED APPT SCHED AND AVS FOR PT....EMAILED MW TO ADD TX.Marland KitchenMarland KitchenMarland Kitchen

## 2012-06-22 NOTE — Patient Instructions (Addendum)
Continue 4mg  (1 tablet) daily except 2mg  (1/2 tablet) on Mon&Wed.  Recheck INR with next scheduled lab appt in 1 week on 06/29/12: lab at 10am and coumadin clinic at 10:15am.

## 2012-06-26 ENCOUNTER — Telehealth: Payer: Self-pay | Admitting: Medical Oncology

## 2012-06-26 NOTE — Telephone Encounter (Signed)
Pt is on taper decadron per Elnita Maxwell RN for Dr Basilio Cairo. I called French Ana her daughter to review her decadron chemo premed. She was confused and I told  French Ana to  call back tomorrow with all her decadron rx including Dr Asa Lente and Dr Basilio Cairo and Judeth Cornfield will tell her how to take her decadron for chemo.

## 2012-06-26 NOTE — Telephone Encounter (Signed)
Message copied by Charma Igo on Mon Jun 26, 2012  4:58 PM ------      Message from: Si Gaul      Created: Fri Jun 23, 2012  7:03 PM       yes      ----- Message -----         From: Charma Igo, RN         Sent: 06/23/2012   1:24 PM           To: Si Gaul, MD            Dr Karoline Caldwell is tapering pt decadron to 2 mg daily starting today x 2 weeks , then 2 mg every other day x 2 weeks.             Do you still want pt to take decadron day before , day of and day after chemo?      Jaquilla Woodroof       ------

## 2012-06-27 MED ORDER — DEXAMETHASONE 4 MG PO TABS: 4.0000 mg | ORAL_TABLET | ORAL | Status: DC

## 2012-06-27 NOTE — Telephone Encounter (Signed)
Discussed tapering dose per Dr Basilio Cairo with [facility] and discussed taking decadron 1 tab BID the day before, of and after chemo per Dr Donnald Garre.  Tracey verbalized understanding with instructions.  Will call in rx for decadron.  SLJ

## 2012-06-27 NOTE — Addendum Note (Signed)
Addended by: Caren Griffins on: 06/27/2012 03:08 PM   Modules accepted: Orders

## 2012-06-29 ENCOUNTER — Other Ambulatory Visit (HOSPITAL_BASED_OUTPATIENT_CLINIC_OR_DEPARTMENT_OTHER): Payer: Commercial Managed Care - PPO

## 2012-06-29 ENCOUNTER — Ambulatory Visit (HOSPITAL_BASED_OUTPATIENT_CLINIC_OR_DEPARTMENT_OTHER): Payer: Commercial Managed Care - PPO | Admitting: Pharmacist

## 2012-06-29 DIAGNOSIS — I82409 Acute embolism and thrombosis of unspecified deep veins of unspecified lower extremity: Secondary | ICD-10-CM

## 2012-06-29 DIAGNOSIS — I82402 Acute embolism and thrombosis of unspecified deep veins of left lower extremity: Secondary | ICD-10-CM

## 2012-06-29 DIAGNOSIS — C3430 Malignant neoplasm of lower lobe, unspecified bronchus or lung: Secondary | ICD-10-CM

## 2012-06-29 LAB — COMPREHENSIVE METABOLIC PANEL (CC13)
Alkaline Phosphatase: 83 U/L (ref 40–150)
CO2: 22 mEq/L (ref 22–29)
Creatinine: 0.7 mg/dL (ref 0.6–1.1)
Glucose: 132 mg/dl — ABNORMAL HIGH (ref 70–99)
Sodium: 135 mEq/L — ABNORMAL LOW (ref 136–145)
Total Bilirubin: 0.62 mg/dL (ref 0.20–1.20)
Total Protein: 6.2 g/dL — ABNORMAL LOW (ref 6.4–8.3)

## 2012-06-29 LAB — MANUAL DIFFERENTIAL
ALC: 1.3 10*3/uL (ref 0.9–3.3)
ANC (CHCC manual diff): 2.2 10*3/uL (ref 1.5–6.5)
Band Neutrophils: 17 % — ABNORMAL HIGH (ref 0–10)
Blasts: 0 % (ref 0–0)
LYMPH: 35 % (ref 14–49)
Other Cell: 0 % (ref 0–0)
PROMYELO: 1 % — ABNORMAL HIGH (ref 0–0)
SEG: 26 % — ABNORMAL LOW (ref 38–77)
Variant Lymph: 0 % (ref 0–0)
nRBC: 26 % — ABNORMAL HIGH (ref 0–0)

## 2012-06-29 LAB — CBC WITH DIFFERENTIAL/PLATELET
HGB: 10.8 g/dL — ABNORMAL LOW (ref 11.6–15.9)
MCH: 29.5 pg (ref 25.1–34.0)
MCV: 86.9 fL (ref 79.5–101.0)
RBC: 3.66 10*6/uL — ABNORMAL LOW (ref 3.70–5.45)
RDW: 18.9 % — ABNORMAL HIGH (ref 11.2–14.5)
WBC: 3.7 10*3/uL — ABNORMAL LOW (ref 3.9–10.3)

## 2012-06-29 LAB — PROTIME-INR: Protime: 21.6 Seconds — ABNORMAL HIGH (ref 10.6–13.4)

## 2012-06-29 LAB — POCT INR: INR: 1.8

## 2012-06-29 NOTE — Progress Notes (Signed)
Pt seen today with daughter. INR=1.8 Will increase dose slightly to 4mg  (1 tablet)  daily except 2mg  (1/2 tablet) on Monday.  Recheck INR with next scheduled lab appt in 1 week on 07/06/12: lab at 9:00am, infusion at 9:30 and a pharmacist will see you in infusion.  Pt reports she is sleeping quite a bit.  Her day 3 post infusion is the worst and can last a few days.  Her appetite is still good and she does experience some nausea (relieved with medication).  She complains of a cough that does produce some yellow sputum.  She denies any fevers.

## 2012-06-29 NOTE — Patient Instructions (Signed)
Continue 4mg  (1 tablet)  daily except 2mg  (1/2 tablet) on Monday. Recheck INR with next scheduled lab appt in 1 week on 07/06/12: lab at 9:00am, infusion at 9:30 and a pharmacist will see you in infusion.

## 2012-07-01 ENCOUNTER — Other Ambulatory Visit: Payer: Self-pay | Admitting: Internal Medicine

## 2012-07-01 MED ORDER — MOXIFLOXACIN HCL 400 MG PO TABS: 400.0000 mg | ORAL_TABLET | Freq: Every day | ORAL | Status: DC

## 2012-07-05 ENCOUNTER — Telehealth: Payer: Self-pay | Admitting: Medical Oncology

## 2012-07-05 ENCOUNTER — Emergency Department (HOSPITAL_COMMUNITY): Payer: Commercial Managed Care - PPO

## 2012-07-05 ENCOUNTER — Encounter (HOSPITAL_COMMUNITY): Payer: Self-pay | Admitting: Emergency Medicine

## 2012-07-05 ENCOUNTER — Inpatient Hospital Stay (HOSPITAL_COMMUNITY)
Admission: EM | Admit: 2012-07-05 | Discharge: 2012-07-10 | DRG: 193 | Disposition: A | Discharge status: Hospice | Payer: Commercial Managed Care - PPO | Attending: Internal Medicine | Admitting: Internal Medicine

## 2012-07-05 DIAGNOSIS — J189 Pneumonia, unspecified organism: Principal | ICD-10-CM

## 2012-07-05 DIAGNOSIS — D72829 Elevated white blood cell count, unspecified: Secondary | ICD-10-CM

## 2012-07-05 DIAGNOSIS — D696 Thrombocytopenia, unspecified: Secondary | ICD-10-CM

## 2012-07-05 DIAGNOSIS — R4701 Aphasia: Secondary | ICD-10-CM

## 2012-07-05 DIAGNOSIS — I82402 Acute embolism and thrombosis of unspecified deep veins of left lower extremity: Secondary | ICD-10-CM

## 2012-07-05 DIAGNOSIS — Z681 Body mass index (BMI) 19 or less, adult: Secondary | ICD-10-CM

## 2012-07-05 DIAGNOSIS — C3492 Malignant neoplasm of unspecified part of left bronchus or lung: Secondary | ICD-10-CM

## 2012-07-05 DIAGNOSIS — C7949 Secondary malignant neoplasm of other parts of nervous system: Secondary | ICD-10-CM | POA: Diagnosis present

## 2012-07-05 DIAGNOSIS — I6350 Cerebral infarction due to unspecified occlusion or stenosis of unspecified cerebral artery: Secondary | ICD-10-CM | POA: Diagnosis present

## 2012-07-05 DIAGNOSIS — F329 Major depressive disorder, single episode, unspecified: Secondary | ICD-10-CM | POA: Diagnosis present

## 2012-07-05 DIAGNOSIS — F3289 Other specified depressive episodes: Secondary | ICD-10-CM | POA: Diagnosis present

## 2012-07-05 DIAGNOSIS — K1231 Oral mucositis (ulcerative) due to antineoplastic therapy: Secondary | ICD-10-CM | POA: Diagnosis present

## 2012-07-05 DIAGNOSIS — T451X5A Adverse effect of antineoplastic and immunosuppressive drugs, initial encounter: Secondary | ICD-10-CM | POA: Diagnosis present

## 2012-07-05 DIAGNOSIS — Z923 Personal history of irradiation: Secondary | ICD-10-CM | POA: Diagnosis present

## 2012-07-05 DIAGNOSIS — E785 Hyperlipidemia, unspecified: Secondary | ICD-10-CM | POA: Diagnosis present

## 2012-07-05 DIAGNOSIS — E440 Moderate protein-calorie malnutrition: Secondary | ICD-10-CM

## 2012-07-05 DIAGNOSIS — C3490 Malignant neoplasm of unspecified part of unspecified bronchus or lung: Secondary | ICD-10-CM | POA: Diagnosis present

## 2012-07-05 DIAGNOSIS — K121 Other forms of stomatitis: Secondary | ICD-10-CM

## 2012-07-05 DIAGNOSIS — R509 Fever, unspecified: Secondary | ICD-10-CM

## 2012-07-05 DIAGNOSIS — R1310 Dysphagia, unspecified: Secondary | ICD-10-CM

## 2012-07-05 DIAGNOSIS — G9341 Metabolic encephalopathy: Secondary | ICD-10-CM

## 2012-07-05 DIAGNOSIS — Z79899 Other long term (current) drug therapy: Secondary | ICD-10-CM

## 2012-07-05 DIAGNOSIS — J209 Acute bronchitis, unspecified: Secondary | ICD-10-CM

## 2012-07-05 DIAGNOSIS — T380X5A Adverse effect of glucocorticoids and synthetic analogues, initial encounter: Secondary | ICD-10-CM | POA: Diagnosis present

## 2012-07-05 DIAGNOSIS — E7800 Pure hypercholesterolemia, unspecified: Secondary | ICD-10-CM | POA: Diagnosis present

## 2012-07-05 DIAGNOSIS — C799 Secondary malignant neoplasm of unspecified site: Secondary | ICD-10-CM

## 2012-07-05 DIAGNOSIS — E860 Dehydration: Secondary | ICD-10-CM | POA: Diagnosis present

## 2012-07-05 DIAGNOSIS — D709 Neutropenia, unspecified: Secondary | ICD-10-CM

## 2012-07-05 DIAGNOSIS — D6481 Anemia due to antineoplastic chemotherapy: Secondary | ICD-10-CM | POA: Diagnosis present

## 2012-07-05 DIAGNOSIS — F17200 Nicotine dependence, unspecified, uncomplicated: Secondary | ICD-10-CM | POA: Diagnosis present

## 2012-07-05 DIAGNOSIS — K219 Gastro-esophageal reflux disease without esophagitis: Secondary | ICD-10-CM | POA: Diagnosis present

## 2012-07-05 DIAGNOSIS — E43 Unspecified severe protein-calorie malnutrition: Secondary | ICD-10-CM

## 2012-07-05 DIAGNOSIS — Z66 Do not resuscitate: Secondary | ICD-10-CM | POA: Diagnosis not present

## 2012-07-05 DIAGNOSIS — Z7901 Long term (current) use of anticoagulants: Secondary | ICD-10-CM

## 2012-07-05 DIAGNOSIS — R5383 Other fatigue: Secondary | ICD-10-CM | POA: Diagnosis present

## 2012-07-05 DIAGNOSIS — R4182 Altered mental status, unspecified: Secondary | ICD-10-CM

## 2012-07-05 DIAGNOSIS — I10 Essential (primary) hypertension: Secondary | ICD-10-CM | POA: Diagnosis present

## 2012-07-05 DIAGNOSIS — Z9221 Personal history of antineoplastic chemotherapy: Secondary | ICD-10-CM | POA: Diagnosis present

## 2012-07-05 DIAGNOSIS — E119 Type 2 diabetes mellitus without complications: Secondary | ICD-10-CM | POA: Diagnosis present

## 2012-07-05 DIAGNOSIS — C7931 Secondary malignant neoplasm of brain: Secondary | ICD-10-CM

## 2012-07-05 DIAGNOSIS — R5381 Other malaise: Secondary | ICD-10-CM | POA: Diagnosis present

## 2012-07-05 DIAGNOSIS — I639 Cerebral infarction, unspecified: Secondary | ICD-10-CM

## 2012-07-05 DIAGNOSIS — K209 Esophagitis, unspecified without bleeding: Secondary | ICD-10-CM

## 2012-07-05 DIAGNOSIS — G9340 Encephalopathy, unspecified: Secondary | ICD-10-CM

## 2012-07-05 DIAGNOSIS — K1230 Oral mucositis (ulcerative), unspecified: Secondary | ICD-10-CM

## 2012-07-05 DIAGNOSIS — Z515 Encounter for palliative care: Secondary | ICD-10-CM

## 2012-07-05 DIAGNOSIS — D649 Anemia, unspecified: Secondary | ICD-10-CM | POA: Diagnosis present

## 2012-07-05 DIAGNOSIS — I82509 Chronic embolism and thrombosis of unspecified deep veins of unspecified lower extremity: Secondary | ICD-10-CM | POA: Diagnosis present

## 2012-07-05 LAB — URINALYSIS, ROUTINE W REFLEX MICROSCOPIC
Glucose, UA: NEGATIVE mg/dL
Leukocytes, UA: NEGATIVE
Protein, ur: NEGATIVE mg/dL
Urobilinogen, UA: 0.2 mg/dL (ref 0.0–1.0)

## 2012-07-05 LAB — COMPREHENSIVE METABOLIC PANEL
ALT: 22 U/L (ref 0–35)
AST: 17 U/L (ref 0–37)
Albumin: 2.8 g/dL — ABNORMAL LOW (ref 3.5–5.2)
Alkaline Phosphatase: 82 U/L (ref 39–117)
BUN: 10 mg/dL (ref 6–23)
Potassium: 4 mEq/L (ref 3.5–5.1)
Sodium: 136 mEq/L (ref 135–145)
Total Protein: 6 g/dL (ref 6.0–8.3)

## 2012-07-05 LAB — CBC WITH DIFFERENTIAL/PLATELET
Basophils Relative: 1 % (ref 0–1)
Eosinophils Relative: 0 % (ref 0–5)
Hemoglobin: 10.6 g/dL — ABNORMAL LOW (ref 12.0–15.0)
Lymphocytes Relative: 5 % — ABNORMAL LOW (ref 12–46)
Monocytes Relative: 5 % (ref 3–12)
Neutrophils Relative %: 89 % — ABNORMAL HIGH (ref 43–77)
Platelets: 548 10*3/uL — ABNORMAL HIGH (ref 150–400)
RBC: 3.51 MIL/uL — ABNORMAL LOW (ref 3.87–5.11)
WBC: 13.6 10*3/uL — ABNORMAL HIGH (ref 4.0–10.5)

## 2012-07-05 LAB — PROTIME-INR: Prothrombin Time: 24.1 seconds — ABNORMAL HIGH (ref 11.6–15.2)

## 2012-07-05 MED ORDER — BIOTENE DRY MOUTH MT LIQD: 15.0000 mL | OROMUCOSAL | Status: DC | PRN

## 2012-07-05 MED ORDER — FOLIC ACID 1 MG PO TABS
1.0000 mg | ORAL_TABLET | Freq: Every day | ORAL | Status: DC
  Administered 2012-07-06 – 2012-07-08 (×3): 1 mg via ORAL
  Filled 2012-07-05 (×5): qty 1

## 2012-07-05 MED ORDER — WARFARIN - PHARMACIST DOSING INPATIENT: Freq: Every day | Status: DC

## 2012-07-05 MED ORDER — SODIUM CHLORIDE 0.9 % IV SOLN
INTRAVENOUS | Status: DC
  Administered 2012-07-05 – 2012-07-06 (×2): via INTRAVENOUS

## 2012-07-05 MED ORDER — SODIUM CHLORIDE 0.9 % IV SOLN
1500.0000 mg | Freq: Once | INTRAVENOUS | Status: AC
  Administered 2012-07-05: 1500 mg via INTRAVENOUS
  Filled 2012-07-05: qty 1500

## 2012-07-05 MED ORDER — ACETAMINOPHEN 325 MG PO TABS: 650.0000 mg | ORAL_TABLET | Freq: Four times a day (QID) | ORAL | Status: DC | PRN

## 2012-07-05 MED ORDER — MIRTAZAPINE 30 MG PO TABS
30.0000 mg | ORAL_TABLET | Freq: Every day | ORAL | Status: DC
  Administered 2012-07-05 – 2012-07-09 (×5): 30 mg via ORAL
  Filled 2012-07-05 (×6): qty 1

## 2012-07-05 MED ORDER — WARFARIN SODIUM 4 MG PO TABS
4.0000 mg | ORAL_TABLET | Freq: Once | ORAL | Status: AC
  Administered 2012-07-05: 4 mg via ORAL
  Filled 2012-07-05: qty 1

## 2012-07-05 MED ORDER — DEXTROSE 5 % IV SOLN
1.0000 g | Freq: Three times a day (TID) | INTRAVENOUS | Status: DC
  Administered 2012-07-05 – 2012-07-07 (×6): 1 g via INTRAVENOUS
  Filled 2012-07-05 (×7): qty 1

## 2012-07-05 MED ORDER — MAGIC MOUTHWASH W/LIDOCAINE
5.0000 mL | Freq: Four times a day (QID) | ORAL | Status: DC | PRN
  Administered 2012-07-08: 5 mL via ORAL
  Filled 2012-07-05: qty 5

## 2012-07-05 MED ORDER — ACETAMINOPHEN 650 MG RE SUPP: 650.0000 mg | Freq: Four times a day (QID) | RECTAL | Status: DC | PRN

## 2012-07-05 MED ORDER — DEXAMETHASONE 2 MG PO TABS: 2.0000 mg | ORAL_TABLET | ORAL | Status: DC

## 2012-07-05 MED ORDER — DEXAMETHASONE 2 MG PO TABS: 2.0000 mg | ORAL_TABLET | Freq: Every day | ORAL | Status: DC

## 2012-07-05 MED ORDER — DEXTROSE 5 % IV SOLN: 1.0000 g | Freq: Two times a day (BID) | INTRAVENOUS | Status: DC

## 2012-07-05 MED ORDER — VANCOMYCIN HCL IN DEXTROSE 750-5 MG/150ML-% IV SOLN
750.0000 mg | Freq: Two times a day (BID) | INTRAVENOUS | Status: DC
  Administered 2012-07-06 – 2012-07-07 (×3): 750 mg via INTRAVENOUS
  Filled 2012-07-05 (×4): qty 150

## 2012-07-05 MED ORDER — DEXAMETHASONE 4 MG PO TABS
4.0000 mg | ORAL_TABLET | Freq: Two times a day (BID) | ORAL | Status: DC
  Administered 2012-07-05: 4 mg via ORAL
  Filled 2012-07-05 (×3): qty 1

## 2012-07-05 NOTE — ED Provider Notes (Signed)
History    CSN: [id number] Arrival date & time 07/05/12  1155  First MD Initiated Contact with Patient 07/05/12 1214     Chief Complaint  Patient presents with  . Altered Mental Status  level 5 caveat due to AMS. (Consider location/radiation/quality/duration/timing/severity/associated sxs/prior Treatment) Patient is a 67 y.o. female presenting with altered mental status. The history is provided by the patient.  Altered Mental Status  patient presents with altered mental status. Reportedly has been off that for the last 2 days. Patient cannot give me much history. She has metastatic lung cancer to her brain. Past Medical History  Diagnosis Date  . Cancer   . Chicken pox   . GERD (gastroesophageal reflux disease)   . Hypertension   . High cholesterol   . Urine incontinence   . Kidney stone   . Lung cancer 05/08/2012  . Status post chemotherapy  05/18/2012    carboplatin for AUC of 5 and Alimta 500 mg/M2 every 3 weeks status post 1 cycle given on 05/18/2012  . S/P radiation therapy  04/20/2012-05/03/2012    Whole Brain / 30Gy/ 10 fractions   Past Surgical History  Procedure Laterality Date  . Abdominal hysterectomy    . Axillary lymph node biopsy Left 04/18/2012    Procedure: AXILLARY LYMPH NODE BIOPSY;  Surgeon: Adolph Pollack, MD;  Location: [facility];  Service: General;  Laterality: Left;  left subclavian. need one hour   Family History  Problem Relation Age of Onset  . Heart attack Father   . Kidney cancer Father   . Arthritis    . Hyperlipidemia    . Heart disease     History  Substance Use Topics  . Smoking status: Current Every Day Smoker -- 0.25 packs/day for 30 years    Types: Cigarettes  . Smokeless tobacco: Never Used  . Alcohol Use: Not on file   OB History   Grav Para Term Preterm Abortions TAB SAB Ect Mult Living                 Review of Systems  Unable to perform ROS: Mental status change  Psychiatric/Behavioral: Positive for altered mental status.     Allergies  Review of patient's allergies indicates no known allergies.  Home Medications   Current Outpatient Rx  Name  Route  Sig  Dispense  Refill  . Alum & Mag Hydroxide-Simeth (MAGIC MOUTHWASH W/LIDOCAINE) SOLN   Swish & Swallow   Swish and swallow 5 mLs 4 (four) times daily as needed (for thrush).         Marland Kitchen dexamethasone (DECADRON) 4 MG tablet   Oral   Take 2-4 mg by mouth as directed. Non-chemo schedule: 0.5 tab daily; Day before, day of, and day after chemo: 1 tab bid (pt took 1 tab 07/05/12, due for pm dose)         . folic acid (FOLVITE) 1 MG tablet   Oral   Take 1 tablet (1 mg total) by mouth daily.   30 tablet   3   . mirtazapine (REMERON) 30 MG tablet   Oral   Take 1 tablet (30 mg total) by mouth at bedtime.   30 tablet   0   . morphine (MS CONTIN) 30 MG 12 hr tablet   Oral   Take 1 tablet (30 mg total) by mouth 2 (two) times daily.   60 tablet   0   . Multiple Vitamin (MULTIVITAMIN WITH MINERALS) TABS   Oral  Take 1 tablet by mouth daily.         Marland Kitchen PRESCRIPTION MEDICATION   Intravenous   Inject into the vein every 21 ( twenty-one) days. Alimta and Paraplatin q 3 weeks.         . prochlorperazine (COMPAZINE) 10 MG tablet   Oral   Take 10 mg by mouth every 6 (six) hours as needed (for nausea).         . warfarin (COUMADIN) 4 MG tablet   Oral   Take 2-4 mg by mouth every evening. 1 tab daily, except for 0.5 tab on Mondays.         Marland Kitchen moxifloxacin (AVELOX) 400 MG tablet   Oral   Take 1 tablet (400 mg total) by mouth daily.   5 tablet   0    BP 103/62  Pulse 68  Temp(Src) 100 F (37.8 C) (Rectal)  Resp 20  SpO2 94% Physical Exam  Constitutional: She appears well-developed and well-nourished.  HENT:  Head: Normocephalic.  Eyes: Pupils are equal, round, and reactive to light.  Neck: Neck supple.  Cardiovascular: Normal rate.   Pulmonary/Chest: Effort normal.  Mild bilateral rhonchi with deep breath. No respiratory distress   Abdominal: There is no tenderness.  Musculoskeletal: Normal range of motion.  Neurological: She is alert.  Patient will look to manage telling her name. She is otherwise just nodding yes or no to questions. She will squeeze my hand to command.    ED Course  Procedures (including critical care time) Labs Reviewed  CBC WITH DIFFERENTIAL - Abnormal; Notable for the following:    WBC 13.6 (*)    RBC 3.51 (*)    Hemoglobin 10.6 (*)    HCT 31.9 (*)    RDW 21.8 (*)    Platelets 548 (*)    Neutrophils Relative % 89 (*)    Lymphocytes Relative 5 (*)    Neutro Abs 12.1 (*)    All other components within normal limits  COMPREHENSIVE METABOLIC PANEL - Abnormal; Notable for the following:    Glucose, Bld 112 (*)    Albumin 2.8 (*)    GFR calc non Af Amer 88 (*)    All other components within normal limits  PROTIME-INR - Abnormal; Notable for the following:    Prothrombin Time 24.1 (*)    INR 2.24 (*)    All other components within normal limits  GLUCOSE, CAPILLARY - Abnormal; Notable for the following:    Glucose-Capillary 110 (*)    All other components within normal limits  URINALYSIS, ROUTINE W REFLEX MICROSCOPIC - Abnormal; Notable for the following:    Color, Urine AMBER (*)    All other components within normal limits  CULTURE, BLOOD (ROUTINE X 2)  CULTURE, BLOOD (ROUTINE X 2)   Dg Chest 2 View  07/05/2012   *RADIOLOGY REPORT*  Clinical Data: Altered mental status, lung cancer.  CHEST - 2 VIEW  Comparison: 05/25/2012 and CT chest 04/16/2012.  Findings: Trachea is midline.  Heart size normal.  Mass like opacities are seen at the left lung apex and right lung base, as before.  No pleural fluid.  IMPRESSION: Bilateral pulmonary masses, as before.  Additional smaller nodular pulmonary metastases are better seen on 04/16/2012.   Original Report Authenticated By: Leanna Battles, M.D.   Ct Head Wo Contrast  07/05/2012   *RADIOLOGY REPORT*  Clinical Data: History of lung cancer and known  metastatic disease to the brain, post whole brain radiation, now with altered  level of consciousness and generalized weakness, ongoing chemotherapy  CT HEAD WITHOUT CONTRAST  Technique:  Contiguous axial images were obtained from the base of the skull through the vertex without contrast.  Comparison: 04/16/2012  Findings:  Overall improved appearance of the brain with resolution of previously noted 7 mm of left to right midline shift.  There is persistent vasogenic edema involving the left frontal lobe ( images 18-20).  Geographic areas of decreased attenuation involving the subcortical aspects of the right frontal lobe (image 16, series 2) and right parietal occipital lobe (image 18) are more conspicuous on the present examination though this is likely secondary to marked degradation on the prior examination due to patient motion artifact.  Grossly unchanged appearance of geographic hypodensities within the left cerebellar lobe (images 8 and 10).  There given background parenchymal abnormalities, there is no CT evidence of acute large territory infarct.  No intraparenchymal or extra-axial hemorrhage.  No definite extra-axial mass.  Limited visualization of the paranasal sinuses demonstrates mucosal thickening within the right maxillary sinus.  Remaining paranasal sinuses and mastoid air cells are normally aerated.  Regional soft tissues are normal.  No displaced calvarial fracture.  IMPRESSION: 1.  Overall improved appearance of the brain without definite acute intracranial process. 2.  Interval resolution of previously noted 7 mm of left to right midline shift.  3.  Persistent findings of known bilateral cerebral and left cerebellar metastases, incompletely evaluated on this noncontrast examination, although likely unchanged.   Original Report Authenticated By: Tacey Ruiz, MD   1. Altered mental status   2. Metastatic cancer     MDM  Patient with metastatic cancer and altered mental status. No clear cause  for the altered mental status. No infection. She does not have clear fever here. Head CT is reassuring. Platelets are not low. Will be admitted to internal medicine for further evaluation.  Juliet Rude. Rubin Payor, MD 07/05/12 [phone]

## 2012-07-05 NOTE — Telephone Encounter (Signed)
Isabella Carpenter reports pt finished avelox today ( she called Dr Arbutus Ped over the weekend because pt had fever 100.6). Today pt is more confused, weak, can't get up very well, coughing is worse and "feet and legs numb". I instructed trace to get [name] to [facility]>.

## 2012-07-05 NOTE — ED Notes (Addendum)
Pt family states for past couple of days pt has had altered loc and general weakness. Last chemo 06/16/12

## 2012-07-05 NOTE — Progress Notes (Signed)
ANTICOAGULATION/ANTIBIOTIC CONSULT NOTE - Initial Consult  Pharmacy Consult for Warfarin, Vancomycin Indication: Hx DVT, HCAP  No Known Allergies  Patient Measurements:   Heparin Dosing Weight:   Vital Signs: Temp: 98.8 F (37.1 C) (06/25 1625) Temp src: Oral (06/25 1625) BP: 123/69 mmHg (06/25 1625) Pulse Rate: 68 (06/25 1230)  Labs:  Recent Labs  07/05/12 1250  HGB 10.6*  HCT 31.9*  PLT 548*  LABPROT 24.1*  INR 2.24*  CREATININE 0.74    The CrCl is unknown because both a height and weight (above a minimum accepted value) are required for this calculation.   Medical History: Past Medical History  Diagnosis Date  . Cancer   . Chicken pox   . GERD (gastroesophageal reflux disease)   . Hypertension   . High cholesterol   . Urine incontinence   . Kidney stone   . Lung cancer 05/08/2012  . Status post chemotherapy  05/18/2012    carboplatin for AUC of 5 and Alimta 500 mg/M2 every 3 weeks status post 1 cycle given on 05/18/2012  . 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 Pt recently hospitalized 5/15 - 5/20 for dysphagia and odynophagia.    Pharmacy consulted to 1) resume warfarin dosing and  2) begin vancomycin therapy with renal antibiotic dose adjustments for HCAP treatment.  Pt has already been started on cefepime.    Home dose warfarin: 4mg  daily except 2mg  q Monday.  Last dose warfarin 6/24.    INR today therapeutic @ 2.24.  Hgb low, plts elevated.  No bleeding noted.    Renal function:  SCr 0.74, CrCl estimated ~59 ml/min, N = 86 ml/min (using rounded SCr 0.8).    WBC elevated at 13.6  Afebrile  Blood, sputum cultures ordered.   Goal of Therapy:  INR 2-3 Monitor platelets by  anticoagulation protocol: Yes  Vancomycin Trough 15-20 mcg/ml.     Plan:  1.  Warfarin 4mg  po x 1 tonight 2.  Vancomycin 1500mg  IV x 1, then 750mg  IV q 12 hours 3.  Daily PT/INR 4.  F/u renal function, CBC, PT/INR, clinical course  Cosima Prentiss E 07/05/2012,5:11 PM

## 2012-07-05 NOTE — H&P (Signed)
[facility] History and Physical  Nayleah Gamel WUJ:[id number] DOB: [birth year] DOA: 07/05/2012  Referring physician: Dr. Rubin Payor PCP: [name], DO  Specialists: [facility]  Chief Complaint: confusion and fevers  HPI: Isabella Carpenter is a 67 y.o. 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 So the history was obtained from the family as the patient is lethargic her daughter she started having fever with decreased appetite has completed her course of antibiotics with ongoing progressive confusion, and productive cough for the last 3 days, she's also been getting short of breath with ambulation with less than 20 feet. The morning of admission she couldn't get out of bed even with admission so they decided to bring her into the  In the ED: CBC was done that shows a white count of 13 with a left shift, chest x-ray showed no acute infiltrates, and her basic metabolic panel is within normal limits. CT scan of the head showed improvement over midline shift  Review of Systems: The patient denies anorexia,  weight loss,, vision loss, decreased hearing, hoarseness, chest pain, syncope, dyspnea on exertion, peripheral edema, balance deficits, hemoptysis, abdominal pain, melena, hematochezia, severe indigestion/heartburn, hematuria, incontinence, genital sores, muscle weakness, suspicious skin lesions, transient blindness, difficulty walking, depression, unusual weight change, abnormal bleeding, enlarged lymph nodes, angioedema, and breast masses.    Past Medical History  Diagnosis Date  . Cancer   . Chicken pox   . GERD (gastroesophageal reflux disease)   .  Hypertension   . High cholesterol   . Urine incontinence   . Kidney stone   . Lung cancer 05/08/2012  . Status post chemotherapy  05/18/2012    carboplatin for AUC of 5 and Alimta 500 mg/M2 every 3 weeks status post 1 cycle given on 05/18/2012  . S/P radiation therapy  04/20/2012-05/03/2012    Whole Brain / 30Gy/ 10 fractions   Past Surgical History  Procedure Laterality Date  . Abdominal hysterectomy    . Axillary lymph node biopsy Left 04/18/2012    Procedure: AXILLARY LYMPH NODE BIOPSY;  Surgeon: Adolph Pollack, MD;  Location: [facility];  Service: General;  Laterality: Left;  left subclavian. need one hour   Social History:  reports that she has been smoking Cigarettes.  She has a 7.5 pack-year smoking history. She has never used smokeless tobacco. She reports that she does not drink alcohol. Her drug history is not on file. Visit home with daughter is dependent on all ADLs  No Known Allergies  Family History  Problem Relation Age of Onset  . Heart attack Father   . Kidney cancer Father   . Arthritis    . Hyperlipidemia    . Heart disease    . Hyperlipidemia Sister     Prior to Admission medications   Medication Sig Start Date End Date Taking? Authorizing Provider  Alum & Mag Hydroxide-Simeth (MAGIC MOUTHWASH W/LIDOCAINE) SOLN Swish and swallow 5 mLs 4 (four) times daily as needed (for thrush).   Yes Historical Provider, MD  dexamethasone (DECADRON) 4 MG tablet Take 2-4 mg by mouth as directed. Non-chemo schedule: 0.5 tab daily; Day before, day of, and day after chemo: 1 tab bid (pt  took 1 tab 07/05/12, due for pm dose) 04/24/12  Yes Lonie Peak, MD  folic acid (FOLVITE) 1 MG tablet Take 1 tablet (1 mg total) by mouth daily. 05/08/12  Yes Si Gaul, MD  mirtazapine (REMERON) 30 MG tablet Take 1 tablet (30 mg total) by mouth at bedtime. 06/12/12  Yes Si Gaul, MD  morphine (MS CONTIN) 30 MG 12 hr tablet Take 1 tablet (30 mg total) by mouth 2 (two) times daily. 05/30/12  Yes  Alison Murray, MD  Multiple Vitamin (MULTIVITAMIN WITH MINERALS) TABS Take 1 tablet by mouth daily.   Yes Historical Provider, MD  PRESCRIPTION MEDICATION Inject into the vein every 21 ( twenty-one) days. Alimta and Paraplatin q 3 weeks.   Yes Historical Provider, MD  prochlorperazine (COMPAZINE) 10 MG tablet Take 10 mg by mouth every 6 (six) hours as needed (for nausea).   Yes Historical Provider, MD  warfarin (COUMADIN) 4 MG tablet Take 2-4 mg by mouth every evening. 1 tab daily, except for 0.5 tab on Mondays.   Yes Historical Provider, MD  moxifloxacin (AVELOX) 400 MG tablet Take 1 tablet (400 mg total) by mouth daily. 07/01/12   Si Gaul, MD   Physical Exam: Filed Vitals:   07/05/12 1225 07/05/12 1230 07/05/12 1237  BP: 115/70 103/62   Pulse: 73 68   Temp: 99.1 F (37.3 C)  100 F (37.8 C)  TempSrc: Oral  Rectal  Resp: 21 20   SpO2: 94% 94%     BP 103/62  Pulse 68  Temp(Src) 100 F (37.8 C) (Rectal)  Resp 20  SpO2 94%  General Appearance:    lethargic no distress, appears stated age  Head:    Normocephalic, without obvious abnormality, atraumatic, temporal wasting            Throat:   Lips, mucosa, and tongue dry, with aphthous ulcers in her mouth   Neck:   Supple, symmetrical, trachea midline, no adenopathy;    thyroid:  no enlargement/tenderness/nodules; no carotid   bruit or JVD  Back:     Symmetric, no curvature, ROM normal, no CVA tenderness  Lungs:     Clear to auscultation bilaterally, respirations unlabored  Chest Wall:    No tenderness or deformity   Heart:    Regular rate and rhythm, S1 and S2 normal, no murmur, rub   or gallop     Abdomen:     Soft, non-tender, bowel sounds active all four quadrants,    no masses, no organomegaly        Extremities:   Extremities normal, atraumatic, no cyanosis or edema  Pulses:   2+ and symmetric all extremities  Skin:   Skin color, texture, turgor normal, no rashes or lesions  Lymph nodes:   Cervical,  supraclavicular, and axillary nodes normal  Neurologic:   CNII-XII intact, normal strength, sensation and reflexes    throughout     Labs on Admission:  Basic Metabolic Panel:  Recent Labs Lab 06/29/12 1000 07/05/12 1250  NA 135* 136  K 4.1 4.0  CL 101 101  CO2 22 24  GLUCOSE 132* 112*  BUN 9.6 10  CREATININE 0.7 0.74  CALCIUM 9.0 8.6   Liver Function Tests:  Recent Labs Lab 06/29/12 1000 07/05/12 1250  AST 13 17  ALT 29 22  ALKPHOS 83 82  BILITOT 0.62 0.6  PROT 6.2* 6.0  ALBUMIN 2.6* 2.8*   No results found for this basename: LIPASE, AMYLASE,  in the last 168 hours  No results found for this basename: AMMONIA,  in the last 168 hours CBC:  Recent Labs Lab 06/29/12 1000 07/05/12 1250  WBC 3.7* 13.6*  NEUTROABS  --  12.1*  HGB 10.8* 10.6*  HCT 31.8* 31.9*  MCV 86.9 90.9  PLT 127* 548*   Cardiac Enzymes: No results found for this basename: CKTOTAL, CKMB, CKMBINDEX, TROPONINI,  in the last 168 hours  BNP (last 3 results) No results found for this basename: PROBNP,  in the last 8760 hours CBG:  Recent Labs Lab 07/05/12 1240  GLUCAP 110*    Radiological Exams on Admission: Dg Chest 2 View  07/05/2012   *RADIOLOGY REPORT*  Clinical Data: Altered mental status, lung cancer.  CHEST - 2 VIEW  Comparison: 05/25/2012 and CT chest 04/16/2012.  Findings: Trachea is midline.  Heart size normal.  Mass like opacities are seen at the left lung apex and right lung base, as before.  No pleural fluid.  IMPRESSION: Bilateral pulmonary masses, as before.  Additional smaller nodular pulmonary metastases are better seen on 04/16/2012.   Original Report Authenticated By: Leanna Battles, M.D.   Ct Head Wo Contrast  07/05/2012   *RADIOLOGY REPORT*  Clinical Data: History of lung cancer and known metastatic disease to the brain, post whole brain radiation, now with altered level of consciousness and generalized weakness, ongoing chemotherapy  CT HEAD WITHOUT CONTRAST  Technique:   Contiguous axial images were obtained from the base of the skull through the vertex without contrast.  Comparison: 04/16/2012  Findings:  Overall improved appearance of the brain with resolution of previously noted 7 mm of left to right midline shift.  There is persistent vasogenic edema involving the left frontal lobe ( images 18-20).  Geographic areas of decreased attenuation involving the subcortical aspects of the right frontal lobe (image 16, series 2) and right parietal occipital lobe (image 18) are more conspicuous on the present examination though this is likely secondary to marked degradation on the prior examination due to patient motion artifact.  Grossly unchanged appearance of geographic hypodensities within the left cerebellar lobe (images 8 and 10).  There given background parenchymal abnormalities, there is no CT evidence of acute large territory infarct.  No intraparenchymal or extra-axial hemorrhage.  No definite extra-axial mass.  Limited visualization of the paranasal sinuses demonstrates mucosal thickening within the right maxillary sinus.  Remaining paranasal sinuses and mastoid air cells are normally aerated.  Regional soft tissues are normal.  No displaced calvarial fracture.  IMPRESSION: 1.  Overall improved appearance of the brain without definite acute intracranial process. 2.  Interval resolution of previously noted 7 mm of left to right midline shift.  3.  Persistent findings of known bilateral cerebral and left cerebellar metastases, incompletely evaluated on this noncontrast examination, although likely unchanged.   Original Report Authenticated By: Tacey Ruiz, MD    EKG: Independently reviewed. Sinus rhythm left axis deviation  Assessment/Plan  Leukocytosis and fever due to Healthcare-associated pneumonia: - We'll go ahead and admit her to the hospital, she took her last dose of Levaquin on 07/05/2012, started her on vancomycin cefepime, get blood cultures and sputum  cultures. She was recently DC'd from the hospital on 05/25/2012. We'll start her on albuterol when necessary, we'll continue to monitor fever curve and he is totally treatment as she continues to improve. Continue Decadron. - I will place her n.p.o. for now and when she is more awake can restart her diet.  Metabolic encephalopathy: - This could be multifactorial secondary  to infectious etiology and ongoing narcotics use. I will go ahead and hold her narcotics. She infection as above. Once the patient is more awake can restart her narcotic.   Lung cancer: - We'll inform her oncologist of depression admission. She has chemotherapy tomorrow.   Ulcer mouth - Probably mucositis from chemotherapy. The family does not relate that she is complaining of any dysphagia. We'll start her on Magic mouthwash will have a low threshold for thrush.    DVT (deep venous thrombosis), left: - Continue Coumadin per pharmacy     Protein-calorie malnutrition, severe -  ensure TID, nutrition consult.  Code Status: full Family Communication: daughter's Disposition Plan: inpatient 3-4 days  Time spent: 27 minmnutes  FELIZ Rosine Beat [facility] Pager [phone]  If 7PM-7AM, please contact night-coverage [email or web address] Password [facility] 07/05/2012, 3:53 PM

## 2012-07-05 NOTE — ED Notes (Signed)
[id number]<WG> Expected date:<BR> Expected time:<BR> Means of arrival:<BR> Comments:<BR> ALOC/brain CA

## 2012-07-05 NOTE — Progress Notes (Signed)
   CARE MANAGEMENT ED NOTE 07/05/2012  Patient:  JONESSA, TRIPLETT   Account Number:  [id number]  Date Initiated:  07/05/2012  Documentation initiated by:  Radford Pax  Subjective/Objective Assessment:   Patient presented to ED with altered loc and generalized weakness     Subjective/Objective Assessment Detail:     Action/Plan:   Action/Plan Detail:   Anticipated DC Date:       Status Recommendation to Physician:   Result of Recommendation:    Other ED Services  Consult Working Plan    DC Planning Services  CM consult  Other    Choice offered to / List presented to:            Status of service:  Completed, signed off  ED Comments:   ED Comments Detail:  EDCM consulted to see patient regarding possible home health needs at discharge.  EDCM spoke to patient's daughter Kennith Center at bedside.  Patient was sleeping. Patient's daughter stated that she lives with the patient. Patient has a walker at home.  Patient's daughter stated she has become weaker to the point where she has to help her mother up when she usually can get up on her own.  EDCM provided patient's daughter a list of home health agencies in [place] and [place] counties.  Explained to patient's daughter that home health can provide a visiting RN, PT, OT and social worker if needed.  Patient's family thankful for assistance.  No furhter needs at this time.

## 2012-07-06 ENCOUNTER — Ambulatory Visit: Payer: Commercial Managed Care - PPO

## 2012-07-06 ENCOUNTER — Other Ambulatory Visit: Payer: Commercial Managed Care - PPO | Admitting: Lab

## 2012-07-06 DIAGNOSIS — D72829 Elevated white blood cell count, unspecified: Secondary | ICD-10-CM

## 2012-07-06 DIAGNOSIS — E440 Moderate protein-calorie malnutrition: Secondary | ICD-10-CM | POA: Diagnosis present

## 2012-07-06 DIAGNOSIS — D649 Anemia, unspecified: Secondary | ICD-10-CM

## 2012-07-06 LAB — PROTIME-INR
INR: 2.59 — ABNORMAL HIGH (ref 0.00–1.49)
Prothrombin Time: 26.9 seconds — ABNORMAL HIGH (ref 11.6–15.2)

## 2012-07-06 LAB — CBC
MCH: 30 pg (ref 26.0–34.0)
MCV: 90.3 fL (ref 78.0–100.0)
Platelets: 552 10*3/uL — ABNORMAL HIGH (ref 150–400)
RBC: 3.2 MIL/uL — ABNORMAL LOW (ref 3.87–5.11)
RDW: 21.9 % — ABNORMAL HIGH (ref 11.5–15.5)
WBC: 11.3 10*3/uL — ABNORMAL HIGH (ref 4.0–10.5)

## 2012-07-06 LAB — BASIC METABOLIC PANEL
CO2: 26 mEq/L (ref 19–32)
Calcium: 8.3 mg/dL — ABNORMAL LOW (ref 8.4–10.5)
Creatinine, Ser: 0.67 mg/dL (ref 0.50–1.10)
GFR calc Af Amer: >90 mL/min (ref >90)
Sodium: 136 mEq/L (ref 135–145)

## 2012-07-06 LAB — HIV ANTIBODY (ROUTINE TESTING W REFLEX): HIV: NONREACTIVE

## 2012-07-06 MED ORDER — MORPHINE SULFATE ER 15 MG PO TBCR
15.0000 mg | EXTENDED_RELEASE_TABLET | Freq: Two times a day (BID) | ORAL | Status: DC
  Administered 2012-07-06 – 2012-07-10 (×8): 15 mg via ORAL
  Filled 2012-07-06 (×8): qty 1

## 2012-07-06 MED ORDER — SODIUM CHLORIDE 0.9 % IV SOLN
INTRAVENOUS | Status: AC
  Administered 2012-07-06 – 2012-07-07 (×2): via INTRAVENOUS

## 2012-07-06 MED ORDER — DEXAMETHASONE 2 MG PO TABS
2.0000 mg | ORAL_TABLET | Freq: Every day | ORAL | Status: DC
  Filled 2012-07-06: qty 1

## 2012-07-06 MED ORDER — JUVEN PO PACK
1.0000 | PACK | Freq: Two times a day (BID) | ORAL | Status: DC
  Administered 2012-07-06 – 2012-07-09 (×4): 1 via ORAL
  Filled 2012-07-06 (×10): qty 1

## 2012-07-06 MED ORDER — MORPHINE SULFATE 2 MG/ML IJ SOLN
INTRAMUSCULAR | Status: AC
  Filled 2012-07-06: qty 1

## 2012-07-06 MED ORDER — ENSURE COMPLETE PO LIQD
237.0000 mL | Freq: Three times a day (TID) | ORAL | Status: DC
  Administered 2012-07-06 – 2012-07-08 (×6): 237 mL via ORAL

## 2012-07-06 MED ORDER — ADULT MULTIVITAMIN W/MINERALS CH
1.0000 | ORAL_TABLET | Freq: Every day | ORAL | Status: DC
  Administered 2012-07-07: 1 via ORAL
  Filled 2012-07-06 (×5): qty 1

## 2012-07-06 MED ORDER — MORPHINE SULFATE 2 MG/ML IJ SOLN
1.0000 mg | INTRAMUSCULAR | Status: DC | PRN
Start: 2012-07-06 — End: 2012-07-10
  Administered 2012-07-06 – 2012-07-07 (×2): 2 mg via INTRAVENOUS
  Filled 2012-07-06: qty 1

## 2012-07-06 MED ORDER — WARFARIN SODIUM 2.5 MG PO TABS
2.5000 mg | ORAL_TABLET | Freq: Once | ORAL | Status: AC
  Administered 2012-07-06: 2.5 mg via ORAL
  Filled 2012-07-06: qty 1

## 2012-07-06 MED ORDER — DEXAMETHASONE 2 MG PO TABS
2.0000 mg | ORAL_TABLET | Freq: Two times a day (BID) | ORAL | Status: DC
  Administered 2012-07-06 – 2012-07-10 (×9): 2 mg via ORAL
  Filled 2012-07-06 (×10): qty 1

## 2012-07-06 NOTE — Progress Notes (Signed)
ANTICOAGULATION Note - follow up  Pharmacy Consult for Warfarin Indication: Hx DVT  No Known Allergies  Patient Measurements: Height: 5' 5.5" (166.4 cm) Weight: 117 lb 1.6 oz (53.116 kg) IBW/kg (Calculated) : 58.15  Vital Signs: Temp: 98.2 F (36.8 C) (06/26 0422) Temp src: Oral (06/26 0422) BP: 136/74 mmHg (06/26 0422) Pulse Rate: 61 (06/26 0422)  Labs:  Recent Labs  07/05/12 1250 07/06/12 0345  HGB 10.6* 9.6*  HCT 31.9* 28.9*  PLT 548* 552*  LABPROT 24.1* 26.9*  INR 2.24* 2.59*  CREATININE 0.74 0.67    Estimated Creatinine Clearance: 59.6 ml/min (by C-G formula based on Cr of 0.67).   Medical History: Past Medical History  Diagnosis Date  . Cancer   . Chicken pox   . GERD (gastroesophageal reflux disease)   . Hypertension   . High cholesterol   . Urine incontinence   . Kidney stone   . Lung cancer 05/08/2012  . Status post chemotherapy  05/18/2012    carboplatin for AUC of 5 and Alimta 500 mg/M2 every 3 weeks status post 1 cycle given on 05/18/2012  . S/P radiation therapy  04/20/2012-05/03/2012    Whole Brain / 30Gy/ 10 fractions    Assessment: 66 yoF on chronic warfarin therapy for history of DVT and on systemic chemotherapy for metastatic NSCLC admitted 6/25 with leukocytosis and fever due to HCAP after failing outpatient treatment with avelox 6/21-6/25. Pt recently hospitalized 5/15 - 5/20 for dysphagia and odynophagia.     Dose PTA: 4mg  daily except 2mg  q Monday.  Last dose warfarin 6/24.   Received 4mg  dose last night  INR today up, therapeutic @ 2.59.    Hgb down.  No bleeding noted.    Goal of Therapy:  INR 2-3 Monitor platelets by anticoagulation protocol: Yes   Plan:  1.  Warfarin 2.5mg  po x 1 tonight 2.  Daily PT/INR  Gwen Her PharmD  [phone] 07/06/2012 7:43 AM

## 2012-07-06 NOTE — Progress Notes (Signed)
INITIAL NUTRITION ASSESSMENT  DOCUMENTATION CODES Per approved criteria  -Non-severe (moderate) malnutrition in the context of chronic illness  Pt meets criteria for moderate MALNUTRITION in the context of chronic as evidenced by 12% wt loss in less than 2 months and mild/moderate fat and muscle wasting evidenced by nutrition-focused physical exam.   INTERVENTION: Provide Ensure Complete TID Provide Juven BID Provide Multivitamin with minerals daily Encourage PO intake  NUTRITION DIAGNOSIS: Increased nutrient needs related to malnutrition, cancer, and wound healing as evidenced by pt with multiple stage 2 pressure ulcers, metastatic cancer, and muscle wasting.   Goal: Pt to meet >/= 90% of their estimated nutrition needs  Monitor:  PO intake Wounds Weight Labs  Reason for Assessment: Consult/ MST  67 y.o. female  Admitting Dx: Healthcare-associated pneumonia  ASSESSMENT: 67 y.o. 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 Pt sleepy at time of visit but, willing to talk with RD. Pt states that her appetite is good and reports that she usually eats 2 meals daily and drinks 6 Ensure supplements daily. Pt denies any nutritional symptoms of chemotherapy/radiation, denies mouth sores, nausea, taste changes etc. but, pt appears to have sores on lips and per chart pt has mouth ulcer. Pt also denies weight loss stating she usually weighs 109 lbs but, per weight history pt weighed 130 lbs 2 months ago and has lost down to 117 lbs. Question accuracy pf pt's report. Per previous RD notes in pt's chart pt's usual body weight was 140 lbs back in March 2014. Per  nutrition-focused physical exam, pt has signs of mild to moderate muscle wasting.   Height: Ht Readings from Last 1 Encounters:  07/05/12 5' 5.5" (1.664 m)    Weight: Wt Readings from Last 1 Encounters:  07/06/12 117 lb 1.6 oz (53.116 kg)    Ideal Body Weight: 127 lbs  % Ideal Body Weight: 92%  Wt Readings from Last 10 Encounters:  07/06/12 117 lb 1.6 oz (53.116 kg)  06/22/12 148 lb 9.6 oz (67.405 kg)  06/08/12 119 lb 14.4 oz (54.386 kg)  06/06/12 120 lb 1.6 oz (54.477 kg)  06/02/12 126 lb 11.2 oz (57.471 kg)  05/30/12 126 lb 4.8 oz (57.289 kg)  05/25/12 124 lb 1.6 oz (56.291 kg)  05/08/12 133 lb 9.6 oz (60.601 kg)  05/01/12 129 lb 14.4 oz (58.922 kg)  04/25/12 127 lb (57.607 kg)    Usual Body Weight: 140 lbs  % Usual Body Weight: 84%  BMI:  Body mass index is 19.18 kg/(m^2).  Estimated Nutritional Needs: Kcal: 1308-6578 Protein: 74-85 grams Fluid: 1.6-1.8 L  Skin: Stage 2 pressure ulcer on buttocks and stage 2 pressure ulcer on coccyx  Nutrition Focused Physical Exam:  Subcutaneous Fat:  Orbital Region: wnl Upper Arm Region: mild wasting Thoracic and Lumbar Region: NA  Muscle:  Temple Region: moderate wasting Clavicle Bone Region: wnl Clavicle and Acromion Bone Region: mild wasting Scapular Bone Region: NA Dorsal Hand: mild/moderate wasting Patellar Region: moderate wasting Anterior Thigh Region: moderate wasting Posterior Calf Region: wnl  Edema: none  Diet Order: General  EDUCATION NEEDS: -No education needs identified at this time   Intake/Output Summary (Last 24 hours) at 07/06/12 0935  Last data filed at 07/06/12 0503  Gross per 24 hour  Intake    120 ml  Output    600 ml  Net   -480 ml    Last BM: 6/25  Labs:   Recent Labs Lab 06/29/12 1000 07/05/12 1250 07/06/12 0345  NA 135* 136 136  K 4.1 4.0 4.5  CL 101 101 104  CO2 22 24 26   BUN 9.6 10 9   CREATININE 0.7 0.74 0.67  CALCIUM 9.0 8.6 8.3*  GLUCOSE 132* 112* 96     CBG (last 3)   Recent Labs  07/05/12 1240  GLUCAP 110*    Scheduled Meds: . ceFEPime (MAXIPIME) IV  1 g Intravenous Q8H  . dexamethasone  2 mg Oral Daily  . folic acid  1 mg Oral Daily  . mirtazapine  30 mg Oral QHS  . vancomycin  750 mg Intravenous Q12H  . warfarin  2.5 mg Oral ONCE-1800  . Warfarin - Pharmacist Dosing Inpatient   Does not apply q1800    Continuous Infusions: . sodium chloride 100 mL/hr at 07/06/12 0550    Past Medical History  Diagnosis Date  . Cancer   . Chicken pox   . GERD (gastroesophageal reflux disease)   . Hypertension   . High cholesterol   . Urine incontinence   . Kidney stone   . Lung cancer 05/08/2012  . Status post chemotherapy  05/18/2012    carboplatin for AUC of 5 and Alimta 500 mg/M2 every 3 weeks status post 1 cycle given on 05/18/2012  . S/P radiation therapy  04/20/2012-05/03/2012    Whole Brain / 30Gy/ 10 fractions    Past Surgical History  Procedure Laterality Date  . Abdominal hysterectomy    . Axillary lymph node biopsy Left 04/18/2012    Procedure: AXILLARY LYMPH NODE BIOPSY;  Surgeon: Adolph Pollack, MD;  Location: [facility];  Service: General;  Laterality: Left;  left subclavian. need one hour    Ian Malkin RD, LDN Inpatient Clinical Dietitian Pager: [phone] After Hours Pager: [phone]

## 2012-07-06 NOTE — Progress Notes (Signed)
Pt. Was found to be restless and agitated. She was pulling at her IV and was trying to get out of the bed. When assessed she was disoriented times 4. Pt. Pointed to her chest and stated that she was hurting badly in that area. Daughter was present in the room and stated that this pain was her baseline. MD notified new orders for pain medicine given. Will continue to monitor.

## 2012-07-06 NOTE — Progress Notes (Signed)
PT Cancellation Note  Patient Details Name: Aymee Fomby MRN: [id number] DOB: 10-16-1945   Cancelled Treatment:    Reason Eval/Treat Not Completed: Fatigue/lethargy limiting ability to participate. Pt not alert today, unable to participate in PT per RN. Will re-attempt tomorrow.    Ralene Bathe Kistler 07/06/2012, 10:46 AM [phone]

## 2012-07-06 NOTE — Progress Notes (Signed)
OT Cancellation Note  Patient Details Name: Nadea Kirkland MRN: [id number] DOB: [birth year]   Cancelled Treatment:    Reason Eval/Treat Not Completed: Fatigue/lethargy limiting ability to participate  Will check back tomorrow.    Haileyann Staiger 07/06/2012, 10:50 AM Marica Otter, OTR/L [phone] 07/06/2012

## 2012-07-06 NOTE — Progress Notes (Signed)
[facility] PROGRESS NOTE  Isabella Carpenter ZOX:[id number] DOB: February 12, 1945 DOA: 07/05/2012 PCP: [name], DO  Brief narrative 67 y.o. 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 She has progressively gotten worse to the point where she can even get out of bed without assistance. The history was obtained from the family as the patient was lethargic. Her daughter stated that she started having fever with decreased appetite, ongoing progressive confusion, and productive cough for the last 3 days. She's also been getting short of breath with ambulation with less than 20 feet. The morning of admission she couldn't get out of bed even with assistance so they decided to bring her into the hospital.   Assessment/Plan: 1. Healthcare-associated pneumonia: she took her last dose of Levaquin on 07/05/2012. She was started empirically on vancomycin & cefepime. Blood cultures x2: Negative to date. Continue Decadron-dose doubled to cover for stressful situation. n.p.o. for now and when she is more awake can restart her diet. Will need followup chest x-ray to ensure resolution of pneumonia findings. HIV-antibody: Nonreactive. 2. Metabolic encephalopathy: This could be multifactorial secondary to infectious etiology and ongoing narcotics use. Narcotics held and pneumonia management as above. Once the patient is better can restart her narcotic. No reported seizure-like activity. CT head 6/25-overall improved appearance of the brain without definite acute intracranial process. Resolved previous left-to-right midline shift and persistent bilateral cerebral and left cerebellar metastasis which appear unchanged. If no improvement in the next 24 hours-? Consider MRI brain. UA  negative. Check TSH, B12 and RPR. 3. Anemia: Secondary to chronic disease, cancer and cancer chemotherapy. Follow CBCs. 4. Leukocytosis: Secondary to problem #1 and steroids. Follow CBCs. 5. Dehydration: continue IVF. 6. Lung cancer: Dr. Arbutus Ped saw patient on 6/26. Chemotherapy which was due today we'll be postponed secondary to acute medical issues. 7. Ulcer mouth: Probably mucositis from chemotherapy. The family does not relate that she is complaining of any dysphagia. We'll start her on Magic mouthwash will have a low threshold for thrush.  8. DVT (deep venous thrombosis), left: Continue Coumadin per pharmacy. Anticoagulated.  9. Protein-calorie malnutrition, moderate:ensure TID, nutrition consult.   Code Status: Full Family Communication: discussed with daughter Ms. [name] via phone. Disposition Plan: Remains inpatient   Consultants:  None  Procedures:  None  Antibiotics:  IV vancomycin 6/25 >  IV cefepime 6/25 >   HPI/Subjective: Patient lethargic but easily arousable and confused-no history obtainable from her. Per nursing, no acute events.  Objective: Filed Vitals:   07/05/12 1625 07/05/12 1713 07/05/12 2014 07/06/12 0422  BP: 123/69 128/75 124/68 136/74  Pulse:  67 65 61  Temp: 98.8 F (37.1 C) 98.2 F (36.8 C) 97.8 F (36.6 C) 98.2 F (36.8 C)  TempSrc: Oral Oral Oral Oral  Resp: 16 18 16 18   Height:  5' 5.5" (1.664 m)    Weight:  52.98 kg (116 lb 12.8 oz)  53.116 kg (117 lb 1.6 oz)  SpO2: 96% 100% 98% 100%    Intake/Output Summary (Last 24 hours) at 07/06/12 1325 Last data filed at 07/06/12 0900  Gross per 24 hour  Intake    240 ml  Output    600 ml  Net   -360 ml   Filed Weights   07/05/12 1713 07/06/12 0422  Weight:  52.98 kg (116 lb 12.8 oz) 53.116 kg (117 lb 1.6 oz)    Exam:   General exam: Comfortable. Mucosa dry.  Respiratory system: Slightly reduced breath sounds in the basis with occasional basal crackles. Rest of lung fields  clear to auscultation. No increased work of breathing.  Cardiovascular system: S1 & S2 heard, RRR. No JVD, murmurs, gallops, clicks or pedal edema.  Gastrointestinal system: Abdomen is nondistended, soft and nontender. Normal bowel sounds heard.  Central nervous system: Lethargic but easily arousable and oriented only to self. Follows 75% of commands. No focal neurological deficits.  Extremities: Symmetric 5 x 5 power.   Data Reviewed: Basic Metabolic Panel:  Recent Labs Lab 07/05/12 1250 07/06/12 0345  NA 136 136  K 4.0 4.5  CL 101 104  CO2 24 26  GLUCOSE 112* 96  BUN 10 9  CREATININE 0.74 0.67  CALCIUM 8.6 8.3*   Liver Function Tests:  Recent Labs Lab 07/05/12 1250  AST 17  ALT 22  ALKPHOS 82  BILITOT 0.6  PROT 6.0  ALBUMIN 2.8*   No results found for this basename: LIPASE, AMYLASE,  in the last 168 hours No results found for this basename: AMMONIA,  in the last 168 hours CBC:  Recent Labs Lab 07/05/12 1250 07/06/12 0345  WBC 13.6* 11.3*  NEUTROABS 12.1*  --   HGB 10.6* 9.6*  HCT 31.9* 28.9*  MCV 90.9 90.3  PLT 548* 552*   Cardiac Enzymes: No results found for this basename: CKTOTAL, CKMB, CKMBINDEX, TROPONINI,  in the last 168 hours BNP (last 3 results) No results found for this basename: PROBNP,  in the last 8760 hours CBG:  Recent Labs Lab 07/05/12 1240  GLUCAP 110*    Recent Results (from the past 240 hour(s))  CULTURE, BLOOD (ROUTINE X 2)     Status: None   Collection Time    07/05/12 12:45 PM      Result Value Range Status   Specimen Description BLOOD LEFT ANTECUBITAL   Final   Special Requests BOTTLES DRAWN AEROBIC AND ANAEROBIC   Final   Culture  Setup Time 07/05/2012 18:46   Final   Culture     Final   Value:        BLOOD CULTURE RECEIVED NO GROWTH TO DATE CULTURE WILL BE HELD FOR 5 DAYS BEFORE ISSUING A FINAL NEGATIVE REPORT   Report Status PENDING   Incomplete  CULTURE, BLOOD (ROUTINE X 2)     Status: None   Collection  Time    07/05/12 12:50 PM      Result Value Range Status   Specimen Description BLOOD LEFT FOREARM   Final   Special Requests BOTTLES DRAWN AEROBIC ONLY   Final   Culture  Setup Time 07/05/2012 18:46   Final   Culture     Final   Value:        BLOOD CULTURE RECEIVED NO GROWTH TO DATE CULTURE WILL BE HELD FOR 5 DAYS BEFORE ISSUING A FINAL NEGATIVE REPORT   Report Status PENDING   Incomplete     Studies: Dg Chest 2 View  07/05/2012   *RADIOLOGY REPORT*  Clinical Data: Altered mental status, lung cancer.  CHEST - 2 VIEW  Comparison: 05/25/2012 and CT chest 04/16/2012.  Findings: Trachea is midline.  Heart size normal.  Mass like opacities are seen at the left lung apex and right lung base, as before.  No pleural fluid.  IMPRESSION: Bilateral pulmonary masses, as before.  Additional  smaller nodular pulmonary metastases are better seen on 04/16/2012.   Original Report Authenticated By: Leanna Battles, M.D.   Ct Head Wo Contrast  07/05/2012   *RADIOLOGY REPORT*  Clinical Data: History of lung cancer and known metastatic disease to the brain, post whole brain radiation, now with altered level of consciousness and generalized weakness, ongoing chemotherapy  CT HEAD WITHOUT CONTRAST  Technique:  Contiguous axial images were obtained from the base of the skull through the vertex without contrast.  Comparison: 04/16/2012  Findings:  Overall improved appearance of the brain with resolution of previously noted 7 mm of left to right midline shift.  There is persistent vasogenic edema involving the left frontal lobe ( images 18-20).  Geographic areas of decreased attenuation involving the subcortical aspects of the right frontal lobe (image 16, series 2) and right parietal occipital lobe (image 18) are more conspicuous on the present examination though this is likely secondary to marked degradation on the prior examination due to patient motion artifact.  Grossly unchanged appearance of geographic hypodensities  within the left cerebellar lobe (images 8 and 10).  There given background parenchymal abnormalities, there is no CT evidence of acute large territory infarct.  No intraparenchymal or extra-axial hemorrhage.  No definite extra-axial mass.  Limited visualization of the paranasal sinuses demonstrates mucosal thickening within the right maxillary sinus.  Remaining paranasal sinuses and mastoid air cells are normally aerated.  Regional soft tissues are normal.  No displaced calvarial fracture.  IMPRESSION: 1.  Overall improved appearance of the brain without definite acute intracranial process. 2.  Interval resolution of previously noted 7 mm of left to right midline shift.  3.  Persistent findings of known bilateral cerebral and left cerebellar metastases, incompletely evaluated on this noncontrast examination, although likely unchanged.   Original Report Authenticated By: Tacey Ruiz, MD     Additional labs:   Scheduled Meds: . ceFEPime (MAXIPIME) IV  1 g Intravenous Q8H  . dexamethasone  2 mg Oral Q12H  . feeding supplement  237 mL Oral TID BM  . folic acid  1 mg Oral Daily  . mirtazapine  30 mg Oral QHS  . multivitamin with minerals  1 tablet Oral Daily  . nutrition supplement  1 packet Oral BID PC  . vancomycin  750 mg Intravenous Q12H  . warfarin  2.5 mg Oral ONCE-1800  . Warfarin - Pharmacist Dosing Inpatient   Does not apply q1800   Continuous Infusions: . sodium chloride 100 mL/hr at 07/06/12 0550    Principal Problem:   Healthcare-associated pneumonia Active Problems:   Non-small cell cancer of left lung   Ulcer mouth   DVT (deep venous thrombosis), left   Metabolic encephalopathy   Leukocytosis   Protein-calorie malnutrition, severe   Fever   Malnutrition of moderate degree    Time spent: 35 minutes    [facility]  [facility] Pager [phone].   If 8PM-8AM, please contact night-coverage at [email or web address], password [facility] 07/06/2012, 1:25 PM  LOS: 1 day

## 2012-07-07 ENCOUNTER — Inpatient Hospital Stay (HOSPITAL_COMMUNITY): Payer: Commercial Managed Care - PPO

## 2012-07-07 DIAGNOSIS — G9340 Encephalopathy, unspecified: Secondary | ICD-10-CM | POA: Diagnosis present

## 2012-07-07 DIAGNOSIS — I639 Cerebral infarction, unspecified: Secondary | ICD-10-CM | POA: Diagnosis present

## 2012-07-07 DIAGNOSIS — C7931 Secondary malignant neoplasm of brain: Secondary | ICD-10-CM | POA: Diagnosis present

## 2012-07-07 DIAGNOSIS — C3490 Malignant neoplasm of unspecified part of unspecified bronchus or lung: Secondary | ICD-10-CM

## 2012-07-07 DIAGNOSIS — I82409 Acute embolism and thrombosis of unspecified deep veins of unspecified lower extremity: Secondary | ICD-10-CM

## 2012-07-07 DIAGNOSIS — R4701 Aphasia: Secondary | ICD-10-CM | POA: Diagnosis present

## 2012-07-07 LAB — CBC
HCT: 27.7 % — ABNORMAL LOW (ref 36.0–46.0)
Hemoglobin: 9.3 g/dL — ABNORMAL LOW (ref 12.0–15.0)
MCV: 90.8 fL (ref 78.0–100.0)
RBC: 3.05 MIL/uL — ABNORMAL LOW (ref 3.87–5.11)
WBC: 11.6 10*3/uL — ABNORMAL HIGH (ref 4.0–10.5)

## 2012-07-07 LAB — PROTIME-INR: INR: 3.37 — ABNORMAL HIGH (ref 0.00–1.49)

## 2012-07-07 MED ORDER — GADOBENATE DIMEGLUMINE 529 MG/ML IV SOLN
11.0000 mL | Freq: Once | INTRAVENOUS | Status: AC | PRN
  Administered 2012-07-07: 11 mL via INTRAVENOUS

## 2012-07-07 MED ORDER — CEFUROXIME AXETIL 500 MG PO TABS
500.0000 mg | ORAL_TABLET | Freq: Two times a day (BID) | ORAL | Status: DC
  Administered 2012-07-08 – 2012-07-10 (×4): 500 mg via ORAL
  Filled 2012-07-07 (×8): qty 1

## 2012-07-07 NOTE — Progress Notes (Addendum)
[facility] PROGRESS NOTE  Isabella Carpenter RUE:[id number] DOB: 1945/03/11 DOA: 07/05/2012 PCP: [name], DO  Brief narrative 67 y.o. 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 She has progressively gotten worse to the point where she can even get out of bed without assistance. The history was obtained from the family as the patient was lethargic. Her daughter stated that she started having fever with decreased appetite, ongoing progressive confusion, and productive cough for the last 3 days. She's also been getting short of breath with ambulation with less than 20 feet. The morning of admission she couldn't get out of bed even with assistance so they decided to bring her into the hospital.   Assessment/Plan: 1. Healthcare-associated pneumonia: she took her last dose of Levaquin on 07/05/2012. She was started empirically on vancomycin & cefepime. Blood cultures x2: Negative to date. Continue Decadron-dose doubled to cover for stressful situation. Will need followup chest x-ray to ensure resolution of pneumonia findings. HIV-antibody: Nonreactive. Per daughter, had productive cough and MAXIMUM TEMPERATURE of 100.6 at home.? Does patient truly had pneumonia versus purulent bronchitis. If cultures negative next 24 hours, consider narrowing antibiotic spectrum. 2. Acute encephalopathy: This could be multifactorial secondary to infectious etiology and ongoing narcotics use. Narcotics reduced and pneumonia management as above. No reported seizure-like activity. CT head 6/25-overall improved appearance of the brain without definite acute intracranial process. Resolved previous left-to-right midline shift and persistent bilateral cerebral and left cerebellar  metastasis which appear unchanged. UA negative. TSH, B12 and RPR all unremarkable. No significant change in mental status. All her presentation most likely secondary to acute stroke. 3. Acute left frontal ischemic stroke with marked aphasia: Neurology consulted. Complete stroke workup. PT, OT and ST evaluation. Patient anticoagulated on Coumadin-discussed with oncologist on call who recommended discontinuing anticoagulation (and allow INR to trend down by itself) due to hemorrhagic brain metastases and risk of worsening outweighs benefits from anticoagulation at this time. 4. Anemia: Secondary to chronic disease, cancer and cancer chemotherapy. Stable 5. Leukocytosis: Secondary to problem #1 and steroids. Stable 6. Dehydration: continue IVF-reduce. Improved 7. Lung cancer with hemorrhagic brain metastases: Dr. Arbutus Ped saw patient on 6/26. Chemotherapy postponed secondary to acute medical issues. Oncologist will formally consult today. 8. Ulcer mouth: Probably mucositis from chemotherapy. The family does not relate that she is complaining of any dysphagia. We'll start her on Magic mouthwash will have a low threshold for thrush.  9. DVT (deep venous thrombosis), left: Discontinue Coumadin secondary to hemorrhagic brain metastases. Venous Doppler 5/23-no DVT right lower extremity but positive for DVT involving left posterior tibial vein and left peroneal vein. 10. Protein-calorie malnutrition, moderate:ensure TID, nutrition consult appreciated.   Code Status: Full Family Communication: discussed with daughter Ms. [name] via phone. Disposition Plan: Remains inpatient   Consultants:  None  Procedures:  None  Antibiotics:  IV vancomycin 6/25 >  IV cefepime 6/25 >   HPI/Subjective: Patient denies complaints. However, responses to questions are mostly inappropriate. Per nursing, no further chest pains that she had yesterday afternoon that resolved after pain medications. Per discussion  with daughter, mental status unchanged compared to admission.? Hallucinations PTA   Objective: Filed Vitals:   07/06/12 1339 07/06/12 2038 07/07/12 0404 07/07/12 0422  BP: 132/70 131/74 184/79 135/73  Pulse: 56 64 69  57  Temp: 98.1 F (36.7 C) 98.7 F (37.1 C) 98.7 F (37.1 C)   TempSrc:  Oral Oral   Resp: 16 15 16    Height:      Weight:   54.341 kg (119 lb 12.8 oz)   SpO2: 96% 100% 100%     Intake/Output Summary (Last 24 hours) at 07/07/12 1152 Last data filed at 07/07/12 0900  Gross per 24 hour  Intake    120 ml  Output      0 ml  Net    120 ml   Filed Weights   07/05/12 1713 07/06/12 0422 07/07/12 0404  Weight: 52.98 kg (116 lb 12.8 oz) 53.116 kg (117 lb 1.6 oz) 54.341 kg (119 lb 12.8 oz)    Exam:   General exam: Looks better than she did yesterday. Comfortable.  Respiratory system: Slightly reduced breath sounds in the basis but otherwise rest of lung fields clear to auscultation. No increased work of breathing.  Cardiovascular system: S1 & S2 heard, RRR. No JVD, murmurs, gallops, clicks or pedal edema.  Gastrointestinal system: Abdomen is nondistended, soft and nontender. Normal bowel sounds heard.  Central nervous system: Alert and oriented only to self. Follows 75% of commands. No focal neurological deficits.? Aphasia  Extremities: Symmetric 5 x 5 power.   Data Reviewed: Basic Metabolic Panel:  Recent Labs Lab 07/05/12 1250 07/06/12 0345  NA 136 136  K 4.0 4.5  CL 101 104  CO2 24 26  GLUCOSE 112* 96  BUN 10 9  CREATININE 0.74 0.67  CALCIUM 8.6 8.3*   Liver Function Tests:  Recent Labs Lab 07/05/12 1250  AST 17  ALT 22  ALKPHOS 82  BILITOT 0.6  PROT 6.0  ALBUMIN 2.8*   No results found for this basename: LIPASE, AMYLASE,  in the last 168 hours  Recent Labs Lab 07/06/12 1434  AMMONIA 10*   CBC:  Recent Labs Lab 07/05/12 1250 07/06/12 0345 07/07/12 0354  WBC 13.6* 11.3* 11.6*  NEUTROABS 12.1*  --   --   HGB 10.6* 9.6* 9.3*   HCT 31.9* 28.9* 27.7*  MCV 90.9 90.3 90.8  PLT 548* 552* 610*   Cardiac Enzymes: No results found for this basename: CKTOTAL, CKMB, CKMBINDEX, TROPONINI,  in the last 168 hours BNP (last 3 results) No results found for this basename: PROBNP,  in the last 8760 hours CBG:  Recent Labs Lab 07/05/12 1240  GLUCAP 110*    Recent Results (from the past 240 hour(s))  CULTURE, BLOOD (ROUTINE X 2)     Status: None   Collection Time    07/05/12 12:45 PM      Result Value Range Status   Specimen Description BLOOD LEFT ANTECUBITAL   Final   Special Requests BOTTLES DRAWN AEROBIC AND ANAEROBIC   Final   Culture  Setup Time 07/05/2012 18:46   Final   Culture     Final   Value:        BLOOD CULTURE RECEIVED NO GROWTH TO DATE CULTURE WILL BE HELD FOR 5 DAYS BEFORE ISSUING A FINAL NEGATIVE REPORT   Report Status PENDING   Incomplete  CULTURE, BLOOD (ROUTINE X 2)     Status: None   Collection Time    07/05/12 12:50 PM      Result Value Range Status   Specimen Description BLOOD LEFT FOREARM   Final   Special Requests BOTTLES DRAWN AEROBIC ONLY   Final   Culture  Setup Time 07/05/2012 18:46  Final   Culture     Final   Value:        BLOOD CULTURE RECEIVED NO GROWTH TO DATE CULTURE WILL BE HELD FOR 5 DAYS BEFORE ISSUING A FINAL NEGATIVE REPORT   Report Status PENDING   Incomplete     Studies: Dg Chest 2 View  07/05/2012   *RADIOLOGY REPORT*  Clinical Data: Altered mental status, lung cancer.  CHEST - 2 VIEW  Comparison: 05/25/2012 and CT chest 04/16/2012.  Findings: Trachea is midline.  Heart size normal.  Mass like opacities are seen at the left lung apex and right lung base, as before.  No pleural fluid.  IMPRESSION: Bilateral pulmonary masses, as before.  Additional smaller nodular pulmonary metastases are better seen on 04/16/2012.   Original Report Authenticated By: Leanna Battles, M.D.   Ct Head Wo Contrast  07/05/2012   *RADIOLOGY REPORT*  Clinical Data: History of lung cancer  and known metastatic disease to the brain, post whole brain radiation, now with altered level of consciousness and generalized weakness, ongoing chemotherapy  CT HEAD WITHOUT CONTRAST  Technique:  Contiguous axial images were obtained from the base of the skull through the vertex without contrast.  Comparison: 04/16/2012  Findings:  Overall improved appearance of the brain with resolution of previously noted 7 mm of left to right midline shift.  There is persistent vasogenic edema involving the left frontal lobe ( images 18-20).  Geographic areas of decreased attenuation involving the subcortical aspects of the right frontal lobe (image 16, series 2) and right parietal occipital lobe (image 18) are more conspicuous on the present examination though this is likely secondary to marked degradation on the prior examination due to patient motion artifact.  Grossly unchanged appearance of geographic hypodensities within the left cerebellar lobe (images 8 and 10).  There given background parenchymal abnormalities, there is no CT evidence of acute large territory infarct.  No intraparenchymal or extra-axial hemorrhage.  No definite extra-axial mass.  Limited visualization of the paranasal sinuses demonstrates mucosal thickening within the right maxillary sinus.  Remaining paranasal sinuses and mastoid air cells are normally aerated.  Regional soft tissues are normal.  No displaced calvarial fracture.  IMPRESSION: 1.  Overall improved appearance of the brain without definite acute intracranial process. 2.  Interval resolution of previously noted 7 mm of left to right midline shift.  3.  Persistent findings of known bilateral cerebral and left cerebellar metastases, incompletely evaluated on this noncontrast examination, although likely unchanged.   Original Report Authenticated By: Tacey Ruiz, MD     Additional labs:   Scheduled Meds: . ceFEPime (MAXIPIME) IV  1 g Intravenous Q8H  . dexamethasone  2 mg Oral Q12H   . feeding supplement  237 mL Oral TID BM  . folic acid  1 mg Oral Daily  . mirtazapine  30 mg Oral QHS  . morphine  15 mg Oral BID  . multivitamin with minerals  1 tablet Oral Daily  . nutrition supplement  1 packet Oral BID PC  . vancomycin  750 mg Intravenous Q12H  . Warfarin - Pharmacist Dosing Inpatient   Does not apply q1800   Continuous Infusions: . sodium chloride 100 mL/hr at 07/07/12 0439    Principal Problem:   Healthcare-associated pneumonia Active Problems:   Non-small cell cancer of left lung   Ulcer mouth   DVT (deep venous thrombosis), left   Leukocytosis   Fever   Malnutrition of moderate degree   Anemia   Acute  encephalopathy    Time spent: 25 minutes    [facility]  [facility] Pager [phone].   If 8PM-8AM, please contact night-coverage at [email or web address], password [facility] 07/07/2012, 11:52 AM  LOS: 2 days

## 2012-07-07 NOTE — Consult Note (Addendum)
Referring Physician: Dr. Glee Arvin    Chief Complaint: Altered mental status with abnormal MRI showing multiple metastatic tumors as well as acute left frontal ischemic stroke.  HPI: Isabella Carpenter is an 67 y.o. 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 Exact onset is unclear. MRI showed multiple metastatic lesions to the cerebrum and cerebellum some of which are hemorrhagic, as well as a new acute left frontal ischemic stroke. Patient has been on Coumadin for DVT prophylaxis. INR was slightly subtherapeutic on admission. INR today was 3.37. NIH stroke score was 5.   LSN: Unclear tPA Given: No: On anticoagulation with Coumadin; beyond time window for treatment consideration. MRankin: 2  Past Medical History  Diagnosis Date  . Cancer   . Chicken pox   . GERD (gastroesophageal reflux disease)   . Hypertension   . High cholesterol   . Urine incontinence   . Kidney stone   . Lung cancer 05/08/2012  . Status post chemotherapy  05/18/2012    carboplatin for AUC of 5 and Alimta 500 mg/M2 every 3 weeks status post 1 cycle given on 05/18/2012  . S/P radiation therapy  04/20/2012-05/03/2012    Whole Brain / 30Gy/ 10 fractions    Family History  Problem Relation Age of Onset  . Heart attack Father   . Kidney cancer Father   . Arthritis    . Hyperlipidemia    . Heart disease    . Hyperlipidemia Sister      Medications:  I have reviewed the patient's current medications. Scheduled: . ceFEPime (MAXIPIME) IV  1 g Intravenous Q8H  . dexamethasone  2 mg Oral Q12H  . feeding supplement  237 mL Oral TID BM  . folic acid  1 mg Oral Daily  . mirtazapine  30 mg Oral QHS  . morphine  15 mg Oral BID  . multivitamin with minerals  1 tablet Oral Daily  . nutrition supplement  1 packet Oral BID PC  . vancomycin  750 mg Intravenous Q12H  . Warfarin - Pharmacist  Dosing Inpatient   Does not apply q1800   Continuous:  AVW:[id number], acetaminophen, antiseptic oral rinse, magic mouthwash w/lidocaine, morphine injection  ROS:  History obtained from chart review  General ROS: negative for - chills, fatigue, fever, night sweats, weight gain or weight loss Psychological ROS: Acute mental status change with confusion as described above in history of present illness Ophthalmic ROS: negative for - blurry vision, double vision, eye pain or loss of vision ENT ROS: negative for - epistaxis, nasal discharge, oral lesions, sore throat, tinnitus or vertigo Allergy and Immunology ROS: negative for - hives or itchy/watery eyes Hematological and Lymphatic ROS: negative for - bleeding problems, bruising or swollen lymph nodes Endocrine ROS: negative for - galactorrhea, hair pattern changes, polydipsia/polyuria or temperature intolerance Respiratory ROS: Shortness of breath on exertion Cardiovascular ROS: negative for - chest pain, dyspnea on exertion, edema or irregular heartbeat Gastrointestinal ROS: negative for - abdominal pain, diarrhea, hematemesis, nausea/vomiting or stool incontinence Genito-Urinary ROS: negative for - dysuria, hematuria, incontinence or urinary frequency/urgency Musculoskeletal ROS: Generalized chronic muscle weakness and fatigue Neurological ROS: as noted in HPI Dermatological ROS: negative for rash and skin lesion changes  Physical Examination: Blood pressure 118/66, pulse 82, temperature 98.4 F (36.9 C), temperature source Oral, resp. rate 18, height 5' 5.5" (1.664 m), weight 54.341 kg (119 lb 12.8 oz), SpO2 100.00%.  Neurologic Examination: Mental Status: Alert with marked expressive aphasia and strong tendency to perseverate with verbal output. Patient was able to follow commands well with only mild signs of receptive aphasia. Cranial Nerves: II-Visual fields were normal. III/IV/VI-Pupils were equal and reacted. Extraocular  movements were full and conjugate.    V/VII-no facial numbness; mild right lower facial weakness. VIII-normal. X-normal speech without dysarthria. Motor: Mild diffuse weakness proximally and distally of upper and lower extremities which was symmetrical. Muscle tone was flaccid throughout. Sensory: Normal throughout. Deep Tendon Reflexes: Trace to 1+ and symmetric. Plantars: Flexor on the left and mute on the right bilaterally Cerebellar: Normal finger-to-nose testing. Carotid auscultation: Normal  Mr Laqueta Jean OZ Contrast  07/07/2012   *RADIOLOGY REPORT*  Clinical Data: Non-small cell lung cancer.  Altered mental status. Question infarct?Marland Kitchen  MRI HEAD WITHOUT AND WITH CONTRAST  Technique:  Multiplanar, multiecho pulse sequences of the brain and surrounding structures were obtained according to standard protocol without and with intravenous contrast  Contrast: 11mL MULTIHANCE GADOBENATE DIMEGLUMINE 529 MG/ML IV SOLN  Comparison: 07/05/2012 and 04/16/2012 CT.  No comparison MR.  Findings: Moderate sized acute/subacute non hemorrhagic left frontal lobe infarct bordering the anterior left opercular region and sub insular region.  Multiple intracranial metastatic lesions some which are hemorrhagic.  All the following measurements on series 10 and compared to the post contrast CT from 04/16/2012:  Anterior left frontal lobe 2.3 x 1.7 cm ( image 39) versus prior 2.5 x 2.1 cm.  Anterior right frontal lobe 3 mm lesion (image 39) not detected by CT.  Right frontal 9.4 x 7.6 mm ring enhancing lesion (image 32) previously 12.5 x 9.9 mm.  Left caudate head 2.2 x 1.8 cm ring enhancing lesion (image 28) previously 2.5 x 1.9 cm.  Posterior right opercular 1.6 x 1.5 cm irregular enhancing lesion (image 25) previously 2.5 x 2 cm.  Right occipital lobe 10.6 x 7.3 mm lesion (image 25) previously 18.7 x 15.8 mm.  Left occipital lobe 1.3 x 1.3 cm ring enhancing lesion (image 18) previously 1.8 x 1.8 cm.  Left cerebellar 1.3 x 1.4  cm lesion (image 12) previously 2.3 x 1.2 cm.  Inferior cerebellar 1.5 x 1 cm lesion (image 7) previously 2.6 x 2 cm.  Left pontine 7 x 6 mm area of enhancement (image 13) not appreciate on prior CT.  Therefore, majority of the intracranial metastatic lesions have decreased in size as has the amount of surrounding vasogenic edema with the largest lesion remaining within the left frontal lobe.  Interval clearing of midline shift noted on the 04/16/2012 exam.  Atrophy without hydrocephalus.  Major intracranial vascular structures are patent.  Transverse ligament hypertrophy.  Polypoid opacification right maxillary sinus.  Mild mucosal thickening right sphenoid sinus.  Partial opacification left mastoid air cells.  IMPRESSION: Moderate sized acute/subacute non hemorrhagic left frontal lobe infarct bordering the anterior left opercular region and sub insular region.  Multiple intracranial metastatic lesions some which are hemorrhagic.  A majority of the intracranial metastatic lesions have decreased in size compared to the 04/16/2012 contrast enhanced CT examination as detailed above.  This has been made a PRA call report utilizing dashboard call feature.   Original Report Authenticated By: Lacy Duverney, M.D.    Assessment: 67 y.o. female with non-small cell carcinoma of the lungs with multiple metastasis to the brain, some of which are hemorrhagic, presenting with acute left frontal ischemic stroke with marked expressive aphasia. This is the likely primary etiology for patient's mental  status change and apparent confusion. Patient is on DVT prophylaxis with Coumadin with current INR supratherapeutic. She showing no signs of active intracranial bleeding. No frank hemorrhagic intracranial mass was noted. Recommend usual stroke risk assessment, unless patient/family indicates desire for no aggressive treatment and possible consideration for hospice involvement with comfort care only.  Stroke Risk Factors -  hyperlipidemia and hypertension  Plan: 1. HgbA1c, fasting lipid panel 2. MRI, MRA  of the brain without contrast 3. PT consult, OT consult, Speech consult 4. Echocardiogram 5. Carotid dopplers 6. Prophylactic therapy-continue anticoagulation with Coumadin if not contraindicated per Oncology 7. Risk factor modification 8. Telemetry monitoring   C.R. Roseanne Reno, MD [facility] [phone]  07/07/2012, 5:08 PM

## 2012-07-07 NOTE — Evaluation (Signed)
Occupational Therapy Evaluation Patient Details Name: Isabella Carpenter MRN: [id number] DOB: 01-Aug-1945 Today's Date: 07/07/2012 Time: 1017-1040 OT Time Calculation (min): 23 min  OT Assessment / Plan / Recommendation History of present illness Pt presents with HCAP and history of lung cancer with mets to brain.  Note that she was disoriented x 3 at time of eval (unsure of baseline) and states that she lives alone.  However, notes state that she lives with daughter.     Clinical Impression   Pt will admitted with the above conditions.  She will benefit from skilled OT to increase balance, cognition and safety with adls.      OT Assessment  Patient needs continued OT Services    Follow Up Recommendations  SNF;Supervision/Assistance - 24 hour (vs)    Barriers to Discharge      Equipment Recommendations  3 in 1 bedside comode (possibly--to be further assessed)    Recommendations for Other Services    Frequency  Min 2X/week    Precautions / Restrictions Precautions Precautions: Fall Precaution Comments: monitor O2 Restrictions Weight Bearing Restrictions: No   Pertinent Vitals/Pain No pain reported    ADL  Grooming: Wash/dry face;Supervision/safety;Moderate assistance (lotion mod A; face supervision, max cues) Toilet Transfer: Simulated;Minimal assistance Toilet Transfer Method: Sit to stand Toilet Transfer Equipment:  (bed, ambulated, then to chair) Equipment Used: Rolling walker Transfers/Ambulation Related to ADLs: Min A to ambulate.  Pt had 2/4 dyspnea but sats were 95-98% on 3 liters 02 ADL Comments: Pt is able to reach to perform ADLs but she needs max cues to continue activities and assist to open containers.  Gets distracted and unable to problem solve a different strategy.  Overall, UB adls min A with extra time and max cueing. and Mod A for LB ADls with max cueing.    OT Diagnosis: Generalized weakness  OT Problem List: Decreased strength;Decreased activity  tolerance;Impaired balance (sitting and/or standing);Decreased cognition;Decreased safety awareness;Cardiopulmonary status limiting activity OT Treatment Interventions: Self-care/ADL training;Balance training;Patient/family education;Cognitive remediation/compensation   OT Goals(Current goals can be found in the care plan section) Acute Rehab OT Goals Patient Stated Goal: n/a OT Goal Formulation: Patient unable to participate in goal setting Time For Goal Achievement: 07/21/12 Potential to Achieve Goals: Good ADL Goals Additional ADL Goal #1: pt will stand for 2 minutes for adls with supervision Additional ADL Goal #2: pt will perform UB adls with supervision and no more than 3 cues in quiet environment  Visit Information  Last OT Received On: 07/07/12 Assistance Needed: +1 PT/OT Co-Evaluation/Treatment: Yes History of Present Illness: Pt presents with HCAP and history of lung cancer with mets to brain.  Note that she was disoriented x 3 at time of eval (unsure of baseline) and states that she lives alone.  However, notes state that she lives with daughter.         Prior Functioning     Home Living Family/patient expects to be discharged to:: Unsure Living Arrangements: Children Available Help at Discharge: Family Type of Home: House Home Layout: One level Home Equipment: None Additional Comments: per note, daughter was at home and helping with adls.  Unsure of bathroom set up. Prior Function Level of Independence: Needs assistance Communication Communication: No difficulties Dominant Hand: Right         Vision/Perception Praxis Praxis: Impaired   Cognition  Cognition Arousal/Alertness: Lethargic Behavior During Therapy: Flat affect Overall Cognitive Status: No family/caregiver present to determine baseline cognitive functioning Current Attention Level: Focused Memory: Decreased short-term memory General  Comments: not oriented; difficulty with all cognition but  does initiate with cues    Extremity/Trunk Assessment Upper Extremity Assessment Upper Extremity Assessment: Overall WFL for tasks assessed (LUE grossly 3+/5; R grossly 4/5) Lower Extremity Assessment Lower Extremity Assessment: Overall WFL for tasks assessed Cervical / Trunk Assessment Cervical / Trunk Assessment: Kyphotic     Mobility Bed Mobility Bed Mobility: Supine to Sit Supine to Sit: 4: Min assist Details for Bed Mobility Assistance: some hand held assist from therapist to get to EOB with cues for hand placement on bed.  Note pt with increased difficulty following commands during session.  Transfers Sit to Stand: 4: Min assist;With upper extremity assist;From bed Stand to Sit: 4: Min assist;With upper extremity assist;With armrests;To chair/3-in-1 Details for Transfer Assistance: Assist to rise and steady with cues for hand placement and controlled descent with sitting.      Exercise     Balance     End of Session OT - End of Session Activity Tolerance: Patient limited by fatigue Patient left: in chair;with call bell/phone within reach;with chair alarm set  GO     Isabella Carpenter 07/07/2012, 11:23 AM Isabella Carpenter, OTR/L [phone] 07/07/2012

## 2012-07-07 NOTE — Progress Notes (Signed)
ANTICOAGULATION Note - follow up  Pharmacy Consult for Warfarin Indication: Hx DVT  No Known Allergies  Patient Measurements: Height: 5' 5.5" (166.4 cm) Weight: 119 lb 12.8 oz (54.341 kg) IBW/kg (Calculated) : 58.15  Vital Signs: Temp: 98.7 F (37.1 C) (06/27 0404) Temp src: Oral (06/27 0404) BP: 135/73 mmHg (06/27 0422) Pulse Rate: 57 (06/27 0422)  Labs:  Recent Labs  07/05/12 1250 07/06/12 0345 07/07/12 0354  HGB 10.6* 9.6* 9.3*  HCT 31.9* 28.9* 27.7*  PLT 548* 552* 610*  LABPROT 24.1* 26.9* 32.9*  INR 2.24* 2.59* 3.37*  CREATININE 0.74 0.67  --     Estimated Creatinine Clearance: 60.9 ml/min (by C-G formula based on Cr of 0.67).   Medical History: Past Medical History  Diagnosis Date  . Cancer   . Chicken pox   . GERD (gastroesophageal reflux disease)   . Hypertension   . High cholesterol   . Urine incontinence   . Kidney stone   . Lung cancer 05/08/2012  . Status post chemotherapy  05/18/2012    carboplatin for AUC of 5 and Alimta 500 mg/M2 every 3 weeks status post 1 cycle given on 05/18/2012  . 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 Pt recently hospitalized 5/15 - 5/20 for dysphagia and odynophagia.     Dose PTA: 4mg  daily except 2mg  q Monday.  Last dose warfarin PTA was 6/24.   4mg , 2.5mg  6/25-26  INR today up, supratherapeutic @ 3.37 - likely secondary to acute illness, decreased PO intake  Hgb down.  No bleeding noted.    Goal of Therapy:  INR 2-3 Monitor platelets by anticoagulation protocol: Yes   Plan:  1.  No warfarin today 2.  Daily PT/INR  Gwen Her PharmD  [phone] 07/07/2012 7:58 AM

## 2012-07-07 NOTE — Care Management Note (Signed)
CM spoke with patient and daughter Isabella Carpenter at the bedside concerning discharge planning. CM informed patient and daughter of PT/OT eval recommendations of SNF vs 24 hour supervision. Pt refusing SNF. Pt's daughter to provide 24 hour care, other family members involved in care. Per pt and and daughter choice [facility] to provide [facility] services upon  Discharge. [facility] rep notified of referral. Pt currently on oxygen therapy. Please assess for possible home oxygen therapy prior to discharge. Awaiting MD orders for RN/PT/OT/Aide.   Isabella Carpenter Isabella Mireles,RN,BSN [phone]

## 2012-07-07 NOTE — Progress Notes (Signed)
Bishop Limbo RN from ED complete swallow study, pt. Passed screening.

## 2012-07-07 NOTE — Progress Notes (Signed)
07/07/2012, 6:28 PM  Hospital day: 3 Antibiotics: ceftin Chemotherapy: cycle 2 Alimta 06-16-12  Called by Dr Waymon Amato with results of MRI brain done today, with new nonhemorrhagic left frontal lobe infarct as well as multiple hemorrhagic and nonhemorrhagic brain metastases.  Patient is on anticoagulation for DVT left posterior tibial and left peroneal veins documented by doppler 06-02-12. Additional information from Drs Arbutus Ped and Basilio Cairo in EMR reviewed.  Patient seen shortly prior to being taken for MRA brain; daughter arrived and we discussed situation at length, with RN present for all of conversation  Subjective: Answers in one word phrases appropriately, denies pain or SOB, replies in affirmative that her speech is better than this AM. She tells me that daughter is coming soon and that I can talk with daughter. Per RN, speech has improved thru day and staff is not aware of any swallowing concerns, tho she has not tried to eat this afternoon.  Objective: Vital signs in last 24 hours: Blood pressure 118/66, pulse 82, temperature 98.4 F (36.9 C), temperature source Oral, resp. rate 18, height 5' 5.5" (1.664 m), weight 119 lb 12.8 oz (54.341 kg), SpO2 100.00%.   Intake/Output from previous day: 06/26 0701 - 06/27 0700 In: 180 [P.O.:180] Out: -  Intake/Output this shift: Total I/O In: 180 [P.O.:180] Out: -   Physical exam: Chronically ill appearing lady, otherwise does not seem in discomfort lying in bed or seated in WC to go to radiology. Respirations not labored RA. Alopecia. Oral mucosa somewhat dry without lesions. Lungs clear anteriorly. Heart RRR. Abdomen soft, active BS, seems nontender. IV at [facility]. LE no pitting edema, cords, tenderness, feet cool but not cold. Not icteric, no significant ecchymoses noted. Follows simple commands with exam.  Lab Results:  Recent Labs  07/06/12 0345 07/07/12 0354  WBC 11.3* 11.6*  HGB 9.6* 9.3*  HCT 28.9* 27.7*  PLT 552* 610*    BMET  Recent Labs  07/05/12 1250 07/06/12 0345  NA 136 136  K 4.0 4.5  CL 101 104  CO2 24 26  GLUCOSE 112* 96  BUN 10 9  CREATININE 0.74 0.67  CALCIUM 8.6 8.3*    Studies/Results: Mr Laqueta Jean ZO Contrast  07/07/2012   *RADIOLOGY REPORT*  Clinical Data: Non-small cell lung cancer.  Altered mental status. Question infarct?Marland Kitchen  MRI HEAD WITHOUT AND WITH CONTRAST  Technique:  Multiplanar, multiecho pulse sequences of the brain and surrounding structures were obtained according to standard protocol without and with intravenous contrast  Contrast: 11mL MULTIHANCE GADOBENATE DIMEGLUMINE 529 MG/ML IV SOLN  Comparison: 07/05/2012 and 04/16/2012 CT.  No comparison MR.  Findings: Moderate sized acute/subacute non hemorrhagic left frontal lobe infarct bordering the anterior left opercular region and sub insular region.  Multiple intracranial metastatic lesions some which are hemorrhagic.  All the following measurements on series 10 and compared to the post contrast CT from 04/16/2012:  Anterior left frontal lobe 2.3 x 1.7 cm ( image 39) versus prior 2.5 x 2.1 cm.  Anterior right frontal lobe 3 mm lesion (image 39) not detected by CT.  Right frontal 9.4 x 7.6 mm ring enhancing lesion (image 32) previously 12.5 x 9.9 mm.  Left caudate head 2.2 x 1.8 cm ring enhancing lesion (image 28) previously 2.5 x 1.9 cm.  Posterior right opercular 1.6 x 1.5 cm irregular enhancing lesion (image 25) previously 2.5 x 2 cm.  Right occipital lobe 10.6 x 7.3 mm lesion (image 25) previously 18.7 x 15.8 mm.  Left occipital lobe 1.3 x  1.3 cm ring enhancing lesion (image 18) previously 1.8 x 1.8 cm.  Left cerebellar 1.3 x 1.4 cm lesion (image 12) previously 2.3 x 1.2 cm.  Inferior cerebellar 1.5 x 1 cm lesion (image 7) previously 2.6 x 2 cm.  Left pontine 7 x 6 mm area of enhancement (image 13) not appreciate on prior CT.  Therefore, majority of the intracranial metastatic lesions have decreased in size as has the amount of  surrounding vasogenic edema with the largest lesion remaining within the left frontal lobe.  Interval clearing of midline shift noted on the 04/16/2012 exam.  Atrophy without hydrocephalus.  Major intracranial vascular structures are patent.  Transverse ligament hypertrophy.  Polypoid opacification right maxillary sinus.  Mild mucosal thickening right sphenoid sinus.  Partial opacification left mastoid air cells.  IMPRESSION: Moderate sized acute/subacute non hemorrhagic left frontal lobe infarct bordering the anterior left opercular region and sub insular region.  Multiple intracranial metastatic lesions some which are hemorrhagic.  A majority of the intracranial metastatic lesions have decreased in size compared to the 04/16/2012 contrast enhanced CT examination as detailed above.  This has been made a PRA call report utilizing dashboard call feature.   Original Report Authenticated By: Lacy Duverney, M.D.     I have reviewed findings from MRI today with daughter, who was aware of this information from her sister's conversation with Dr Waymon Amato; we have also discussed patient's very difficult course since presentation with metastatic NSC carcinoma to brain in April of this year. Daughter asked about prognosis, which I have explained was poor due to brain mets even prior to new CVA, and have also explained that all treatment with radiation and chemotherapy is in attempt to control disease but is not curative, which she seems to have understood already. Daughter tells me that patient immediately completed Will and Living Will when she was diagnosed with cancer, and was in process of cleaning out her home as " she is ready to go". Daughter tells me that the initial lengthy hospitalization, complicated by DVT and decubitus, was very difficult for the patient, and that daughter also felt that the chemotherapy was too hard on her to continue. I have told her that, with the new CVA in addition to hemorrhagic brain  mets, patient likely will not be a candidate for chemotherapy ongoing. Daughter tells me that, in addition to requesting DNR, her mother "does not want to go to a rest home". Daughter is very open to consideration of Hospice; note another daughter has moved into mother's home to care for her and this daughter lives nearby and is also very involved. I have emphasized that we need to look at whole picture and that we will respect her mother's requests/ wishes about interventions and care. We have discussed holding anticoagulation due to hemorrhagic brain mets, tho otherwise anticoagulation might be used with the CVA; we will use SCDs now; note daughter had DVT with PE previously. Daughter aware that I am glad to talk with her or her sister further tonight if they have other questions or concerns. I have placed DNR order now. Recommend Hospice/ Palliative Care meeting hopefully early next week.  Assessment/Plan: 1.Metastatic NSC carcinoma of lung with multiple brain mets diagnosed April 2014, post whole brain RT by Dr Basilio Cairo and 2 cycles of Alimta thru 06-16-12. I doubt further chemotherapy will be possible. Suggest Hospice/ palliative consult. 2.New nonhemorrhagic frontal CVA: neurology involved. Swallowing study pending 3.DNR patient's request per daughter's discussion with me now, Advance Directives done  and will be brought to hospital by family 4.DVT LLE 05-2012. Anticoagulation held due to hemorrhagic brain mets. This is a very difficult situation, unfortunately one with no good outcome likely. If neurology feels strongly that anticoagulation would be of significant benefit for the CVA, patient and family may want to continue anticoagulation, however certainly she is at high risk for more bleeding from the mets. SCDs ordered for now.  I will let Dr Arbutus Ped know situation. Please call over weekend if our covering physicians can assist. Dr [facility] care much appreciated.  Joury Allcorn  P [phone]

## 2012-07-07 NOTE — Evaluation (Signed)
Physical Therapy Evaluation Patient Details Name: Isabella Carpenter MRN: [id number] DOB: 1945/02/06 Today's Date: 07/07/2012 Time: 1012-1040 PT Time Calculation (min): 28 min  PT Assessment / Plan / Recommendation History of Present Illness  Pt presents with HCAP and history of lung cancer with mets to brain.  Note that she was disoriented x 3 at time of eval (unsure of baseline) and states that she lives alone.  However, notes state that she lives with daughter.    Clinical Impression  Pt presents with decreased strength and endurance with history of lung cancer and mets to brain.  Tolerated ambulation in hallway with RW at min assist, however did note that pt was SOB upon returning to room.  Ambulated on 3L O2 with SaO2 at 97% prior to ambulation and 92% following amb.  Pt quickly recovered to 95%.  Pt will benefit from skilled PT in acute venue to address deficits.  PT recommends HHPT for follow up as long as pt has 24/7 care assist.  If not pt may need SNF/memory care unit at D/C.,     PT Assessment  Patient needs continued PT services    Follow Up Recommendations  Home health PT;Supervision/Assistance - 24 hour    Does the patient have the potential to tolerate intense rehabilitation      Barriers to Discharge   unsure if daughter is available 24/7    Equipment Recommendations  Rolling walker with 5" wheels    Recommendations for Other Services     Frequency Min 3X/week    Precautions / Restrictions Precautions Precautions: Fall Precaution Comments: monitor O2 Restrictions Weight Bearing Restrictions: No   Pertinent Vitals/Pain No pain      Mobility  Bed Mobility Bed Mobility: Supine to Sit Supine to Sit: 4: Min assist Details for Bed Mobility Assistance: some hand held assist from therapist to get to EOB with cues for hand placement on bed.  Note pt with increased difficulty following commands during session.  Transfers Transfers: Stand to Sit;Sit to Stand Sit to  Stand: 4: Min assist;With upper extremity assist;From bed Stand to Sit: 4: Min assist;With upper extremity assist;With armrests;To chair/3-in-1 Details for Transfer Assistance: Assist to rise and steady with cues for hand placement and controlled descent with sitting.  Ambulation/Gait Ambulation/Gait Assistance: 4: Min assist Ambulation Distance (Feet): 180 Feet Assistive device: Rolling walker Ambulation/Gait Assistance Details: Cues for continued pursed lip breathing and to maintain position inside of RW.   Gait Pattern: Step-through pattern;Decreased stride length Gait velocity: decreased Stairs: No Wheelchair Mobility Wheelchair Mobility: No    Exercises     PT Diagnosis: Difficulty walking;Generalized weakness  PT Problem List: Decreased activity tolerance;Decreased balance;Decreased mobility;Decreased coordination;Decreased cognition;Decreased knowledge of use of DME;Decreased safety awareness;Cardiopulmonary status limiting activity PT Treatment Interventions: DME instruction;Gait training;Functional mobility training;Therapeutic activities;Therapeutic exercise;Balance training;Patient/family education     PT Goals(Current goals can be found in the care plan section) Acute Rehab PT Goals Patient Stated Goal: n/a PT Goal Formulation: With patient Time For Goal Achievement: 07/14/12 Potential to Achieve Goals: Good  Visit Information  Last PT Received On: 07/07/12 Assistance Needed: +1 History of Present Illness: Pt presents with HCAP and history of lung cancer with mets to brain.  Note that she was disoriented x 3 at time of eval (unsure of baseline) and states that she lives alone.  However, notes state that she lives with daughter.         Prior Functioning  Home Living Family/patient expects to be discharged to:: Private residence  Living Arrangements: Children Available Help at Discharge: Family Type of Home: House Home Layout: One level Home Equipment:  None Additional Comments: Pt states she lives alone and is able to perform ADL's independently, however she was disoriented x 3 during eval and per notes was living with daughter and getting assist for ADLs.  Prior Function Level of Independence: Needs assistance Communication Communication: No difficulties    Cognition  Cognition Arousal/Alertness: Lethargic Behavior During Therapy: Flat affect Overall Cognitive Status: No family/caregiver present to determine baseline cognitive functioning Memory: Decreased short-term memory    Extremity/Trunk Assessment Lower Extremity Assessment Lower Extremity Assessment: Overall WFL for tasks assessed Cervical / Trunk Assessment Cervical / Trunk Assessment: Kyphotic   Balance    End of Session PT - End of Session Equipment Utilized During Treatment: Oxygen Activity Tolerance: Patient limited by fatigue Patient left: in chair;with call bell/phone within reach;with chair alarm set Nurse Communication: Mobility status  GP     Vista Deck 07/07/2012, 10:47 AM

## 2012-07-08 DIAGNOSIS — J209 Acute bronchitis, unspecified: Secondary | ICD-10-CM | POA: Diagnosis present

## 2012-07-08 DIAGNOSIS — C801 Malignant (primary) neoplasm, unspecified: Secondary | ICD-10-CM

## 2012-07-08 DIAGNOSIS — I6350 Cerebral infarction due to unspecified occlusion or stenosis of unspecified cerebral artery: Secondary | ICD-10-CM

## 2012-07-08 DIAGNOSIS — C7931 Secondary malignant neoplasm of brain: Secondary | ICD-10-CM

## 2012-07-08 DIAGNOSIS — Z515 Encounter for palliative care: Secondary | ICD-10-CM

## 2012-07-08 LAB — PROTIME-INR: INR: 2.34 — ABNORMAL HIGH (ref 0.00–1.49)

## 2012-07-08 LAB — LIPID PANEL
HDL: 45 mg/dL (ref >39)
Triglycerides: 148 mg/dL (ref <150)

## 2012-07-08 NOTE — Evaluation (Addendum)
Speech Language Pathology Evaluation Patient Details Name: Isabella Carpenter MRN: [id number] DOB: September 09, 1945 Today's Date: 07/08/2012 Time: 4696-2952 SLP Time Calculation (min): 26 min  Problem List:  Patient Active Problem List   Diagnosis Date Noted  . Acute encephalopathy 07/07/2012  . CVA (cerebral infarction) 07/07/2012  . Expressive aphasia 07/07/2012  . Brain metastasis 07/07/2012  . Malnutrition of moderate degree 07/06/2012  . Anemia 07/06/2012  . Metabolic encephalopathy 07/05/2012  . Healthcare-associated pneumonia 07/05/2012  . Leukocytosis 07/05/2012  . Protein-calorie malnutrition, severe 07/05/2012  . Fever 07/05/2012  . brain metastases 06/06/2012  . DVT (deep venous thrombosis), left 06/02/2012  . Dysphagia, unspecified(787.20) 05/26/2012  . Neutropenia 05/26/2012  . Thrombocytopenia, unspecified 05/26/2012  . Mucositis 05/26/2012  . Ulcer mouth 05/26/2012  . Esophagitis 05/25/2012  . Non-small cell cancer of left lung 05/08/2012   Past Medical History:  Past Medical History  Diagnosis Date  . Cancer   . Chicken pox   . GERD (gastroesophageal reflux disease)   . Hypertension   . High cholesterol   . Urine incontinence   . Kidney stone   . Lung cancer 05/08/2012  . Status post chemotherapy  05/18/2012    carboplatin for AUC of 5 and Alimta 500 mg/M2 every 3 weeks status post 1 cycle given on 05/18/2012  . S/P radiation therapy  04/20/2012-05/03/2012    Whole Brain / 30Gy/ 10 fractions   Past Surgical History:  Past Surgical History  Procedure Laterality Date  . Abdominal hysterectomy    . Axillary lymph node biopsy Left 04/18/2012    Procedure: AXILLARY LYMPH NODE BIOPSY;  Surgeon: Adolph Pollack, MD;  Location: [facility];  Service: General;  Laterality: Left;  left subclavian. need one hour   HPI:  67 yo female adm to [facility] 07-05-12 - with fever, weakness found to have HCAP.   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    Pt with metabolic encephalopathy, found to have acute left frontal cva per MRI 84132 with aphasia.  Pt with mouth ulcers but she denies dysphagia - magic mouthwash was started.  Order for speech-cognitive eval received secondary to acute neuro event.     Assessment / Plan / Recommendation Clinical Impression  Pt presents with significant aphasia impacting both expressive and receptive areas and suspected cognitive deficits.   Nonfluent aphasia and apraxia results in significant pauses and groping with communication attempts- pt answering questions in essentially one word answers when able.  Automatic speech tasks including singing, phrase completion facilitated pt's verbalizations.  Reading single words also helpful to facilitate communication.  Receptively, pt was able to follow 1 of 4 commands, answer 3 of 5 yes/no questions correctly.   No family present to ascertain baseline functioning level, but suspect pt's left frontal lobe CVA has impacting her communication abilities.  SLP to follow for language treatment to faciliate communication for pt to meet her needs and participate in her care.      SLP Assessment  Patient needs continued Speech Lanaguage Pathology Services    Follow Up Recommendations  Home health SLP    Frequency and Duration min 2x/week  2 weeks   Pertinent Vitals/Pain Afebrile, decreased   SLP Goals  SLP Goals Potential to Achieve Goals: Fair Potential Considerations: Medical prognosis;Severity of impairments;Ability to learn/carryover information (? prior level of function) SLP Goal #1: Pt will answer basic y/n questions re: biographical, immediate environment with 75% accuracy and min verbal/visual  cues.   SLP Goal #2: Pt will complete automatic speech tasks to faciliate functional verbalizations with 80% accuracy and min assist.  SLP Goal #3: Pt will name items used for ADLs with 75% accuracy and moderate sentence completion or written cues by SLP.     SLP Evaluation Prior Functioning  Type of Home: House  Lives With:  (family per chart)   Cognition  Overall Cognitive Status: No family/caregiver present to determine baseline cognitive functioning Arousal/Alertness: Awake/alert Orientation Level: Oriented to person;Disoriented to situation;Disoriented to place;Disoriented to time Attention: Sustained Sustained Attention: Appears intact Memory:  (recalled daughter's names with written cue) Awareness: Impaired Awareness Impairment: Intellectual impairment Problem Solving: Impaired (did not indicate call bell when asked to reach nurse, coglng) Problem Solving Impairment: Functional basic Executive Function:  (pt not at executive level of functioning) Behaviors: Perseveration Safety/Judgment: Impaired    Comprehension  Auditory Comprehension Overall Auditory Comprehension: Impaired Yes/No Questions: Impaired Basic Biographical Questions: 51-75% accurate Basic Immediate Environment Questions: 25-49% accurate Commands: Impaired One Step Basic Commands: 50-74% accurate Conversation: Simple (impaired) Interfering Components: Processing speed EffectiveTechniques: Extra processing time;Repetition;Stressing words Visual Recognition/Discrimination Discrimination: Not tested Reading Comprehension Reading Status: Impaired Word level: Impaired (pt correctly identified 2/3 words) Sentence Level: Not tested Paragraph Level: Not tested Functional Environmental (signs, name badge): Not tested Interfering Components: Processing time    Expression Expression Primary Mode of Expression: Verbal Verbal Expression Overall Verbal Expression: Impaired Initiation: Impaired Automatic Speech: Social Response;Counting;Singing Level of Generative/Spontaneous Verbalization: Word Repetition: Impaired Level of Impairment: Word level Naming: Impairment Responsive: 0-25% accurate (reading word facilitated responses) Confrontation:  Impaired Verbal Errors: Not aware of errors;Phonemic paraphasias;Perseveration Pragmatics: Unable to assess Effective Techniques: Phonemic cues;Written cues (automatic speech tasks) Other Verbal Expression Comments: pt reading single words facilitated communication (given choice of two : pt able to correctly identify 50% of words)  Written Expression Dominant Hand: Right Written Expression: Not tested   Oral / Motor Motor Speech Overall Motor Speech:  (no focal cn deficits observed impacting speech) Phonation: Low vocal intensity Resonance: Within functional limits Articulation: Within functional limitis Motor Planning: Impaired Level of Impairment: Phrase Motor Speech Errors: Groping for words;Inconsistent;Aware;Unaware (inconsistent awareness)   GO     Donavan Burnet, MS [facility] SLP [phone]    Note palliative meeting scheduled for today at 1630.  TK

## 2012-07-08 NOTE — Consult Note (Signed)
Patient XL:[id number] Coil      DOB: [birth year]      UVO:[id number]   Summary of goals of care; full note to follow:   Meet with patients's daughters Angelica Chessman and French Ana and granddaughter Luther Parody   Patient unable to participate due to altered mental status and confusion.   Patient was diagnosed with Stage IV Lung cancer in April.  She had undergone chemo and radiation.  She presented to the hospital with cough and worsened alter mental status found to have a new frontal stroke and hemorrhagic lesions related to metastasis. Coumadin has been discontinued.  Family would like to forgo further chemo and pursue hospice home placement.  She is currently sleeping for most of the time , not really eating and is having some signs of dysphagia.     Family Lived in [place] and is familiar with [facility].    Currently , symptoms of left chest pain are being controlled with long acting opiates which will likely need to change to liquid opiate as she becomes more unable to swallow.   Please note that it is extremely important for her to be cremated and scattered over the [facility] as she met her late husband their.  She is looking forward to being with him and has [place] many [place]   Time: 436-544 pm  Dr. Waymon Amato updated.  Will talk with Dr. Shirline Frees first thing Monday.   Mazal Ebey L. Ladona Ridgel, MD MBA The Palliative Medicine Team at [facility] Phone: [phone] Pager: [phone]

## 2012-07-08 NOTE — Progress Notes (Signed)
Thank you for consulting the Palliative Medicine Team at [facility] to meet your patient's and family's needs.   The reason that you asked [place] to see your patient is  For GOC  We have scheduled your patient for a meeting: Ten[place], for 430 pm today.  Family will call to change if they can't make it  The Surrogate decision make is: daughters Angelica Chessman and Elmo Putt information: See face sheet  Other family members that need to be present:     Your patient is able/unable to participate: sleepy but will try to incorporate  Additional Narrative:

## 2012-07-08 NOTE — Progress Notes (Signed)
Echo Lab  2D Echocardiogram completed.  Rino Hosea L Dajour [place], RDCS 07/08/2012 1:50 PM

## 2012-07-08 NOTE — Progress Notes (Addendum)
[facility] PROGRESS NOTE  Isabella Carpenter AVW:[id number] DOB: Aug 18, 1945 DOA: 07/05/2012 PCP: [name], DO  Brief narrative 67 y.o. 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 She has progressively gotten worse to the point where she can even get out of bed without assistance. The history was obtained from the family as the patient was lethargic. Her daughter stated that she started having fever with decreased appetite, ongoing progressive confusion, and productive cough for the last 3 days. She's also been getting short of breath with ambulation with less than 20 feet. The morning of admission she couldn't get out of bed even with assistance so they decided to bring her into the hospital.   Assessment/Plan: 1. Possibly purulent bronchitis/less likely Healthcare-associated pneumonia: she took her last dose of Levaquin on 07/05/2012. She was started empirically on vancomycin & cefepime. Blood cultures x2: Negative to date. Continue Decadron-dose doubled to cover for stressful situation. HIV-antibody: Nonreactive. Per daughter, had productive cough and MAXIMUM TEMPERATURE of 100.6 at home. Narrowed antibiotics to oral Ceftin-complete total 7 days course. 2. Acute encephalopathy: Most likely secondary to acute left frontal ischemic stroke. CT head 6/25-overall improved appearance of the brain without definite acute intracranial process. Resolved previous left-to-right midline shift and persistent bilateral cerebral and left cerebellar metastasis which appear unchanged. UA negative. TSH, B12 and RPR all unremarkable. No significant change in mental status. All her presentation most likely secondary to acute stroke-see discussion in problem #3. 3. Acute left  frontal ischemic stroke with marked aphasia: Neurology input appreciated. Complete stroke workup. PT, OT and ST evaluation. Patient anticoagulated on Coumadin-discussed with oncologist and discontinued anticoagulation due to hemorrhagic brain metastases. No aspirin started secondary to hemorrhagic brain metastases. LDL greater than 100: Withhold statins until goals of care clarified. 4. Anemia: Secondary to chronic disease, cancer and cancer chemotherapy. Stable 5. Leukocytosis: Secondary to problem #1 and steroids. Stable 6. Dehydration: continue IVF-reduce. Improved 7. Lung cancer with hemorrhagic brain metastases: Dr. Arbutus Ped saw patient on 6/26. Chemotherapy postponed secondary to acute medical issues. Dr. Darrold Span input appreciated: recommends Hospice/Palliative consultation- done 8. Ulcer mouth: Probably mucositis from chemotherapy. The family does not relate that she is complaining of any dysphagia. We'll start her on Magic mouthwash will have a low threshold for thrush.  9. DVT (deep venous thrombosis), left: Discontinue Coumadin secondary to hemorrhagic brain metastases. Venous Doppler 5/23-no DVT right lower extremity but positive for DVT involving left posterior tibial vein and left peroneal vein. 10. Protein-calorie malnutrition, moderate:ensure TID, nutrition consult appreciated.   Code Status: Full Family Communication: None Disposition Plan: Remains inpatient   Consultants:  Oncology  Palliative care team  Procedures:  None  Antibiotics:  IV vancomycin 6/25 > 6/27  IV cefepime 6/25 > 6/27  Oral Ceftin 6/28 >   HPI/Subjective: Patient denies complaints. Significant expressive aphasia.  Objective: Filed Vitals:   07/07/12 1348 07/07/12 2025 07/08/12 0247 07/08/12 0700  BP: 118/66 126/74 119/63 139/70  Pulse: 82 63 60 59  Temp: 98.4 F (36.9 C) 98.9 F (37.2 C) 98.4 F (36.9 C) 98.5 F (36.9 C)  TempSrc:  Oral Oral Oral  Resp: 18 20 20 16   Height:       Weight:      SpO2: 100% 100% 100% 100%    Intake/Output Summary (  Last 24 hours) at 07/08/12 0742 Last data filed at 07/08/12 0500  Gross per 24 hour  Intake    210 ml  Output      0 ml  Net    210 ml   Filed Weights   07/05/12 1713 07/06/12 0422 07/07/12 0404  Weight: 52.98 kg (116 lb 12.8 oz) 53.116 kg (117 lb 1.6 oz) 54.341 kg (119 lb 12.8 oz)    Exam:   General exam: Looks better than she did yesterday. Comfortable.  Respiratory system: Slightly reduced breath sounds in the basis but otherwise rest of lung fields clear to auscultation. No increased work of breathing.  Cardiovascular system: S1 & S2 heard, RRR. No JVD, murmurs, gallops, clicks or pedal edema.  Gastrointestinal system: Abdomen is nondistended, soft and nontender. Normal bowel sounds heard.  Central nervous system: Alert and oriented only to self. Follows 75% of commands. No focal neurological deficits. Aphasia  Extremities: Symmetric 5 x 5 power.   Data Reviewed: Basic Metabolic Panel:  Recent Labs Lab 07/05/12 1250 07/06/12 0345  NA 136 136  K 4.0 4.5  CL 101 104  CO2 24 26  GLUCOSE 112* 96  BUN 10 9  CREATININE 0.74 0.67  CALCIUM 8.6 8.3*   Liver Function Tests:  Recent Labs Lab 07/05/12 1250  AST 17  ALT 22  ALKPHOS 82  BILITOT 0.6  PROT 6.0  ALBUMIN 2.8*   No results found for this basename: LIPASE, AMYLASE,  in the last 168 hours  Recent Labs Lab 07/06/12 1434  AMMONIA 10*   CBC:  Recent Labs Lab 07/05/12 1250 07/06/12 0345 07/07/12 0354  WBC 13.6* 11.3* 11.6*  NEUTROABS 12.1*  --   --   HGB 10.6* 9.6* 9.3*  HCT 31.9* 28.9* 27.7*  MCV 90.9 90.3 90.8  PLT 548* 552* 610*   Cardiac Enzymes: No results found for this basename: CKTOTAL, CKMB, CKMBINDEX, TROPONINI,  in the last 168 hours BNP (last 3 results) No results found for this basename: PROBNP,  in the last 8760 hours CBG:  Recent Labs Lab 07/05/12 1240  GLUCAP 110*    Recent Results (from the past  240 hour(s))  CULTURE, BLOOD (ROUTINE X 2)     Status: None   Collection Time    07/05/12 12:45 PM      Result Value Range Status   Specimen Description BLOOD LEFT ANTECUBITAL   Final   Special Requests BOTTLES DRAWN AEROBIC AND ANAEROBIC   Final   Culture  Setup Time 07/05/2012 18:46   Final   Culture     Final   Value:        BLOOD CULTURE RECEIVED NO GROWTH TO DATE CULTURE WILL BE HELD FOR 5 DAYS BEFORE ISSUING A FINAL NEGATIVE REPORT   Report Status PENDING   Incomplete  CULTURE, BLOOD (ROUTINE X 2)     Status: None   Collection Time    07/05/12 12:50 PM      Result Value Range Status   Specimen Description BLOOD LEFT FOREARM   Final   Special Requests BOTTLES DRAWN AEROBIC ONLY   Final   Culture  Setup Time 07/05/2012 18:46   Final   Culture     Final   Value:        BLOOD CULTURE RECEIVED NO GROWTH TO DATE CULTURE WILL BE HELD FOR 5 DAYS BEFORE ISSUING A FINAL NEGATIVE REPORT   Report Status PENDING   Incomplete     Studies: Mr Maxine Glenn  Head Wo Contrast  07/07/2012   *RADIOLOGY REPORT*  Clinical Data: Altered mental status.  Left frontal infarction.  MRA HEAD WITHOUT CONTRAST  Technique: Angiographic images of the Circle of Willis were obtained using MRA technique without intravenous contrast.  Comparison: MRI same day  Findings: Both internal carotid arteries are widely patent into the brain.  No siphon stenosis.  The anterior and middle cerebral vessels are patent bilaterally.  There is focal stenosis of the right A1 segment estimated at 50%.  No stenosis is seen on the left.  Both middle cerebral arteries are patent but the distal vessel shows some atherosclerotic irregularity.  Both vertebral arteries are patent with the right being dominant. No basilar stenosis.  Posterior circulation branch vessels are patent with the right PCA receiving most of its supply from the anterior circulation.  IMPRESSION: No major vessel occlusion or correctable proximal stenosis. Specifically, the  left anterior cerebral artery is patent.  Some atherosclerotic irregularity in the MCA branches distally.  50% stenosis of the right A1 segment.   Original Report Authenticated By: Paulina Fusi, M.D.   Mr Laqueta Jean Wo Contrast  07/07/2012   *RADIOLOGY REPORT*  Clinical Data: Non-small cell lung cancer.  Altered mental status. Question infarct?Marland Kitchen  MRI HEAD WITHOUT AND WITH CONTRAST  Technique:  Multiplanar, multiecho pulse sequences of the brain and surrounding structures were obtained according to standard protocol without and with intravenous contrast  Contrast: 11mL MULTIHANCE GADOBENATE DIMEGLUMINE 529 MG/ML IV SOLN  Comparison: 07/05/2012 and 04/16/2012 CT.  No comparison MR.  Findings: Moderate sized acute/subacute non hemorrhagic left frontal lobe infarct bordering the anterior left opercular region and sub insular region.  Multiple intracranial metastatic lesions some which are hemorrhagic.  All the following measurements on series 10 and compared to the post contrast CT from 04/16/2012:  Anterior left frontal lobe 2.3 x 1.7 cm ( image 39) versus prior 2.5 x 2.1 cm.  Anterior right frontal lobe 3 mm lesion (image 39) not detected by CT.  Right frontal 9.4 x 7.6 mm ring enhancing lesion (image 32) previously 12.5 x 9.9 mm.  Left caudate head 2.2 x 1.8 cm ring enhancing lesion (image 28) previously 2.5 x 1.9 cm.  Posterior right opercular 1.6 x 1.5 cm irregular enhancing lesion (image 25) previously 2.5 x 2 cm.  Right occipital lobe 10.6 x 7.3 mm lesion (image 25) previously 18.7 x 15.8 mm.  Left occipital lobe 1.3 x 1.3 cm ring enhancing lesion (image 18) previously 1.8 x 1.8 cm.  Left cerebellar 1.3 x 1.4 cm lesion (image 12) previously 2.3 x 1.2 cm.  Inferior cerebellar 1.5 x 1 cm lesion (image 7) previously 2.6 x 2 cm.  Left pontine 7 x 6 mm area of enhancement (image 13) not appreciate on prior CT.  Therefore, majority of the intracranial metastatic lesions have decreased in size as has the amount of  surrounding vasogenic edema with the largest lesion remaining within the left frontal lobe.  Interval clearing of midline shift noted on the 04/16/2012 exam.  Atrophy without hydrocephalus.  Major intracranial vascular structures are patent.  Transverse ligament hypertrophy.  Polypoid opacification right maxillary sinus.  Mild mucosal thickening right sphenoid sinus.  Partial opacification left mastoid air cells.  IMPRESSION: Moderate sized acute/subacute non hemorrhagic left frontal lobe infarct bordering the anterior left opercular region and sub insular region.  Multiple intracranial metastatic lesions some which are hemorrhagic.  A majority of the intracranial metastatic lesions have decreased in size compared to the 04/16/2012  contrast enhanced CT examination as detailed above.  This has been made a PRA call report utilizing dashboard call feature.   Original Report Authenticated By: Lacy Duverney, M.D.     Additional labs:   Scheduled Meds: . cefUROXime  500 mg Oral BID WC  . dexamethasone  2 mg Oral Q12H  . feeding supplement  237 mL Oral TID BM  . folic acid  1 mg Oral Daily  . mirtazapine  30 mg Oral QHS  . morphine  15 mg Oral BID  . multivitamin with minerals  1 tablet Oral Daily  . nutrition supplement  1 packet Oral BID PC   Continuous Infusions:    Principal Problem:   Healthcare-associated pneumonia Active Problems:   Non-small cell cancer of left lung   Ulcer mouth   DVT (deep venous thrombosis), left   Leukocytosis   Fever   Malnutrition of moderate degree   Anemia   Acute encephalopathy   CVA (cerebral infarction)   Expressive aphasia   Brain metastasis    Time spent: 25 minutes    [facility]  [facility] Pager [phone].   If 8PM-8AM, please contact night-coverage at [email or web address], password [facility] 07/08/2012, 7:42 AM  LOS: 3 days

## 2012-07-08 NOTE — Consult Note (Signed)
Patient Isabella Carpenter      DOB: [birth year]      VWU:[id number]     Consult Note from the Palliative Medicine Team at [facility]    Consult Requested by: Isabella Carpenter    PCP: Isabella Koyanagi., DO Reason for Consultation: Goals of care     Phone Number:[phone] Related symptom management Assessment of patients Current state: Patient is a 67 year old white female with a known past medical history for tobacco abuse, hypertension and a recent diagnosis of stage IV metastatic lung cancer first identified in April of 2014. Patient had undergone treatment with carboplatin and Alimta in conjunction with whole brain radiation as she was found to have metastatic disease to the brain. The patient presented to the hospital with confusion and fevers. She had recently been treated with a course of Levaquin for possible postobstructive pneumonia. Evaluation in the emergency room discovered a new frontal stroke. Patient is not fully able to participate in goals of care. Her 2 daughters Isabella Carpenter and Isabella Carpenter met with me to discuss their goals. At this time they do not desire further chemotherapy. They would like to transition her to residential hospice for end-of-life care. They understand her wishes to include no feeding tubes and they understand that future treatment with antibiotic therapy would be discontinued. At this time continuation of medications for comfort as their primary goal. Of note the patient's granddaughter Isabella Carpenter is very active in her care and life and was present during the majority of our point goals of care. We addressed Isabella Carpenter's need for feeling safe an educated about her grandmother's condition. Isabella Carpenter said her biggest worry was having something happen to her right in front of her.  Goals of Care: 1.  Code Status: Confirm DO NOT RESUSCITATE DO NOT INTUBATE status  2. Scope of Treatment: at this time the girls are focusing more on comfort and dignity they are okay with continuing antibiotics  for now. They understand the role of Decadron. In the desire comfort feed. Further laboratories will be unnecessary particularly following INRs and she will not be on further Coumadin. Further workup for stroke is not necessary.    4. Disposition: We'll place a social work consult to offer choice for transition to residential hospice home and bed is available  3. Symptom Management:   1. Anxiety/Agitation: Okay continue Remeron as long as patient can take oral tablets. Ativan 0.5 mg IV q. 4 hours when necessary could also be reduced if agitation is an issue at. At this time and is not a problem 2. Pain: agree with as needed morphine. And continue Decadron to keep swelling down. 3. Bowel Regimen: monitor and treat as needed 4. Delirium: currently controlled with Remeron and Decadron. 5. Fever: Tylenol as needed 6. Nausea/Vomiting: Currently not an issue but as needed medication can be utilized if this becomes a problem. Consideration for a proton pump inhibitor while on Decadron could be considered.   4. Psychosocial: patient has worked all of her life. 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 One of her coworkers alerted her family to significant changes including confusion, which led to her diagnosis in April of metastatic lung cancer. The patient is very active in her grandchildren 5 offering playing games with them. [name] her to touching note which is posted in the patient's room   5. Spiritual: patient would benefit from chaplain services for support         Patient Documents Completed or Given: Document Given Completed  Advanced Directives Pkt    MOST    DNR    Gone from My Sight    Hard Choices      Brief HPI: Patient is a 67 year old white female recently diagnosed with stage IV lung cancer metastatic to brain. She presented to the hospital with fever and altered mental status and was found to have new frontal stroke with hemorrhagic metastatic  lesions. Her Coumadin was discontinued. We were asked to assist with goals of care.   ROS: Limited by the patient's cognitive status. Patient states she is in no pain   PMH:  Past Medical History  Diagnosis Date  . Cancer   . Chicken pox   . GERD (gastroesophageal reflux disease)   . Hypertension   . High cholesterol   . Urine incontinence   . Kidney stone   . Lung cancer 05/08/2012  . Status post chemotherapy  05/18/2012    carboplatin for AUC of 5 and Alimta 500 mg/M2 every 3 weeks status post 1 cycle given on 05/18/2012  . S/P radiation therapy  04/20/2012-05/03/2012    Whole Brain / 30Gy/ 10 fractions     PSH: Past Surgical History  Procedure Laterality Date  . Abdominal hysterectomy    . Axillary lymph node biopsy Left 04/18/2012    Procedure: AXILLARY LYMPH NODE BIOPSY;  Surgeon: Adolph Pollack, MD;  Location: [facility]S;  Service: General;  Laterality: Left;  left subclavian. need one hour   I have reviewed the FH and SH and  If appropriate update it with new information. No Known Allergies Scheduled Meds: . cefUROXime  500 mg Oral BID WC  . dexamethasone  2 mg Oral Q12H  . feeding supplement  237 mL Oral TID BM  . folic acid  1 mg Oral Daily  . mirtazapine  30 mg Oral QHS  . morphine  15 mg Oral BID  . multivitamin with minerals  1 tablet Oral Daily  . nutrition supplement  1 packet Oral BID PC   Continuous Infusions:  PRN Meds:.acetaminophen, acetaminophen, antiseptic oral rinse, magic mouthwash w/lidocaine, morphine injection    BP 122/74  Pulse 65  Temp(Src) 98.2 F (36.8 C) (Oral)  Resp 14  Ht 5' 5.5" (1.664 m)  Wt 54.341 kg (119 lb 12.8 oz)  BMI 19.63 kg/m2  SpO2 100%   PPS: 20-30%   Intake/Output Summary (Last 24 hours) at 07/08/12 1729 Last data filed at 07/08/12 1400  Gross per 24 hour  Intake     30 ml  Output   1300 ml  Net  -1270 ml   Physical Exam:  General: Somnolent, will keep ice" when excessively stimulated will open them  briefly and then go back to rest HEENT:   pupils are equal, round and reactive affect is slightly flattened his membranes are moist Chest:    Decreased but clear to auscultation toe on the left base CVS:  regular rate and rhythm positive S1 and S2 no S3-S4 Abdomen: soft nondistended nontender positive bowel sounds Ext:  warm, no lesions no edema Neuro: able to respond slowly to verbal and tactile stimuli she confabulates she does identify her family  Labs: CBC    Component Value Date/Time   WBC 11.6* 07/07/2012 0354   WBC 3.7* 06/29/2012 1000   RBC 3.05* 07/07/2012 0354   RBC 3.66* 06/29/2012 1000   HGB 9.3* 07/07/2012 0354   HGB 10.8* 06/29/2012 1000   HCT 27.7* 07/07/2012 0354   HCT 31.8* 06/29/2012 1000  PLT 610* 07/07/2012 0354   PLT 127* 06/29/2012 1000   MCV 90.8 07/07/2012 0354   MCV 86.9 06/29/2012 1000   MCH 30.5 07/07/2012 0354   MCH 29.5 06/29/2012 1000   MCHC 33.6 07/07/2012 0354   MCHC 34.0 06/29/2012 1000   RDW 21.8* 07/07/2012 0354   RDW 18.9* 06/29/2012 1000   LYMPHSABS 0.7 07/05/2012 1250   LYMPHSABS 0.4* 06/22/2012 1132   MONOABS 0.7 07/05/2012 1250   MONOABS 0.0* 06/22/2012 1132   EOSABS 0.0 07/05/2012 1250   EOSABS 0.0 06/22/2012 1132   BASOSABS 0.1 07/05/2012 1250   BASOSABS 0.0 06/22/2012 1132       CMP     Component Value Date/Time   NA 136 07/06/2012 0345   NA 135* 06/29/2012 1000   K 4.5 07/06/2012 0345   K 4.1 06/29/2012 1000   CL 104 07/06/2012 0345   CL 101 06/29/2012 1000   CO2 26 07/06/2012 0345   CO2 22 06/29/2012 1000   GLUCOSE 96 07/06/2012 0345   GLUCOSE 132* 06/29/2012 1000   BUN 9 07/06/2012 0345   BUN 9.6 06/29/2012 1000   CREATININE 0.67 07/06/2012 0345   CREATININE 0.7 06/29/2012 1000   CALCIUM 8.3* 07/06/2012 0345   CALCIUM 9.0 06/29/2012 1000   PROT 6.0 07/05/2012 1250   PROT 6.2* 06/29/2012 1000   ALBUMIN 2.8* 07/05/2012 1250   ALBUMIN 2.6* 06/29/2012 1000   AST 17 07/05/2012 1250   AST 13 06/29/2012 1000   ALT 22 07/05/2012 1250   ALT 29 06/29/2012 1000    ALKPHOS 82 07/05/2012 1250   ALKPHOS 83 06/29/2012 1000   BILITOT 0.6 07/05/2012 1250   BILITOT 0.62 06/29/2012 1000   GFRNONAA >90 07/06/2012 0345   GFRAA >90 07/06/2012 0345    Chest Xray Reviewed/Impressions:  C Bilateral pulmonary masses, smaller nodule pulmonary metastases are better viewed on the film in April scan of the Head Reviewed/Impressions:  MRI No major vessel occlusion or correctable proximal stenosis.  Specifically, the left anterior cerebral artery is patent.  Some atherosclerotic irregularity in the MCA branches distally.  50% stenosis of the right A1 segment.   Moderate sized acute/subacute non hemorrhagic left frontal lobe  infarct bordering the anterior left opercular region and sub  insular region.  Multiple intracranial metastatic lesions some which are  hemorrhagic. A majority of the intracranial metastatic lesions  have decreased in size compared to the 04/16/2012 contrast enhanced  CT examination as detailed above    Time In Time Out Total Time Spent with Patient Total Overall Time  430 pm 530 pm  20 minutes   60 minutes     Greater than 50%  of this time was spent counseling and coordinating care related to the above assessment and plan.   will contact Dr. Shirline Frees on Monday. Reviewed oncology consult from this weekend and discussed the case with Isabella Carpenter.  Isabella Carpenter L. Ladona Ridgel, MD MBA The Palliative Medicine Team at [facility] Phone: [phone] Pager: [phone]

## 2012-07-09 NOTE — Progress Notes (Signed)
[facility] PROGRESS NOTE  Isabella Carpenter ZOX:[id number] DOB: 09/12/1945 DOA: 07/05/2012 PCP: [name], DO  Brief narrative 67 y.o. 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 She has progressively gotten worse to the point where she can even get out of bed without assistance. The history was obtained from the family as the patient was lethargic. Her daughter stated that she started having fever with decreased appetite, ongoing progressive confusion, and productive cough for the last 3 days. She's also been getting short of breath with ambulation with less than 20 feet. The morning of admission she couldn't get out of bed even with assistance so they decided to bring her into the hospital. Workup in the hospital revealed new acute left frontal ischemic stroke which is the most likely reason for her altered mental status and aphasia. She also has hemorrhagic brain metastases. Palliative care team met with family and family have opted for hospice and awaiting hospice home placement.   Assessment/Plan: 1. Possibly purulent bronchitis/less likely Healthcare-associated pneumonia: she took her last dose of Levaquin on 07/05/2012. She was started empirically on vancomycin & cefepime. Blood cultures x2: Negative to date. Continue Decadron-dose doubled to cover for stressful situation. HIV-antibody: Nonreactive. Per daughter, had productive cough and MAXIMUM TEMPERATURE of 100.6 at home. Narrowed antibiotics to oral Ceftin-complete total 7 days course. 2. Acute encephalopathy: Most likely secondary to acute left frontal ischemic stroke. CT head 6/25-overall improved appearance of the brain without definite acute intracranial process. Resolved previous left-to-right  midline shift and persistent bilateral cerebral and left cerebellar metastasis which appear unchanged. UA negative. TSH, B12 and RPR all unremarkable. No significant change in mental status. All her presentation most likely secondary to acute stroke-see discussion in problem #3. Patient has been progressively somnolent since yesterday. 3. Acute left frontal ischemic stroke with marked aphasia: Neurology input appreciated. Complete stroke workup. PT, OT and ST evaluation. Patient anticoagulated on Coumadin-discussed with oncologist and discontinued anticoagulation due to hemorrhagic brain metastases. No aspirin started secondary to hemorrhagic brain metastases. LDL greater than 100: No further workup or intervention for stroke at this time because she is being transitioned to hospice care. 4. Anemia: Secondary to chronic disease, cancer and cancer chemotherapy. Stable 5. Leukocytosis: Secondary to problem #1 and steroids. Stable 6. Dehydration: continue IVF-reduce. Improved 7. Lung cancer with hemorrhagic brain metastases: Dr. Arbutus Ped saw patient on 6/26. Chemotherapy postponed secondary to acute medical issues. Dr. Darrold Span input appreciated: recommends Hospice/Palliative consultation- done 8. Ulcer mouth: Probably mucositis from chemotherapy. The family does not relate that she is complaining of any dysphagia. We'll start her on Magic mouthwash will have a low threshold for thrush.  9. DVT (deep venous thrombosis), left: Discontinue Coumadin secondary to hemorrhagic brain metastases. Venous Doppler 5/23-no DVT right lower extremity but positive for DVT involving left posterior tibial vein and left peroneal vein. 10. Protein-calorie malnutrition, moderate:ensure TID, nutrition consult appreciated.   Code Status: Full Family Communication: None Disposition Plan: Remains inpatient he had awaiting hospice home placement.   Consultants:  Oncology  Palliative care  team  Procedures:  None  Antibiotics:  IV vancomycin 6/25 > 6/27  IV cefepime 6/25 > 6/27  Oral Ceftin 6/28 >   HPI/Subjective: Somnolent, briefly opens eyes and drifts back to sleep. Per nursing staff, patient has  been mostly sleeping and refusing food since yesterday.  Objective: Filed Vitals:   07/08/12 0700 07/08/12 1400 07/08/12 2049 07/09/12 0500  BP: 139/70 122/74 127/73 116/65  Pulse: 59 65 64 62  Temp: 98.5 F (36.9 C) 98.2 F (36.8 C) 99 F (37.2 C) 98.7 F (37.1 C)  TempSrc: Oral Oral Oral Oral  Resp: 16 14 16 18   Height:      Weight:    52.3 kg (115 lb 4.8 oz)  SpO2: 100% 100% 100% 100%    Intake/Output Summary (Last 24 hours) at 07/09/12 1143 Last data filed at 07/09/12 0900  Gross per 24 hour  Intake      0 ml  Output   2150 ml  Net  -2150 ml   Filed Weights   07/06/12 0422 07/07/12 0404 07/09/12 0500  Weight: 53.116 kg (117 lb 1.6 oz) 54.341 kg (119 lb 12.8 oz) 52.3 kg (115 lb 4.8 oz)    Exam:   General exam: Comfortably lying in bed.  Respiratory system: Poor inspiratory effort. Reduced breath sounds in the bases. No increased work of breathing.  Cardiovascular system: S1 & S2 heard, RRR. No JVD, murmurs, gallops, clicks or pedal edema.  Gastrointestinal system: Abdomen is nondistended, soft and nontender. Normal bowel sounds heard.  Central nervous system: Somnolent, briefly opens eyes to touch but drifts back to sleep.   Data Reviewed: Basic Metabolic Panel:  Recent Labs Lab 07/05/12 1250 07/06/12 0345  NA 136 136  K 4.0 4.5  CL 101 104  CO2 24 26  GLUCOSE 112* 96  BUN 10 9  CREATININE 0.74 0.67  CALCIUM 8.6 8.3*   Liver Function Tests:  Recent Labs Lab 07/05/12 1250  AST 17  ALT 22  ALKPHOS 82  BILITOT 0.6  PROT 6.0  ALBUMIN 2.8*   No results found for this basename: LIPASE, AMYLASE,  in the last 168 hours  Recent Labs Lab 07/06/12 1434  AMMONIA 10*   CBC:  Recent Labs Lab 07/05/12 1250 07/06/12 0345  07/07/12 0354  WBC 13.6* 11.3* 11.6*  NEUTROABS 12.1*  --   --   HGB 10.6* 9.6* 9.3*  HCT 31.9* 28.9* 27.7*  MCV 90.9 90.3 90.8  PLT 548* 552* 610*   Cardiac Enzymes: No results found for this basename: CKTOTAL, CKMB, CKMBINDEX, TROPONINI,  in the last 168 hours BNP (last 3 results) No results found for this basename: PROBNP,  in the last 8760 hours CBG:  Recent Labs Lab 07/05/12 1240  GLUCAP 110*    Recent Results (from the past 240 hour(s))  CULTURE, BLOOD (ROUTINE X 2)     Status: None   Collection Time    07/05/12 12:45 PM      Result Value Range Status   Specimen Description BLOOD LEFT ANTECUBITAL   Final   Special Requests BOTTLES DRAWN AEROBIC AND ANAEROBIC   Final   Culture  Setup Time 07/05/2012 18:46   Final   Culture     Final   Value:        BLOOD CULTURE RECEIVED NO GROWTH TO DATE CULTURE WILL BE HELD FOR 5 DAYS BEFORE ISSUING A FINAL NEGATIVE REPORT   Report Status PENDING   Incomplete  CULTURE, BLOOD (ROUTINE X 2)     Status: None   Collection Time    07/05/12 12:50 PM      Result Value Range Status   Specimen Description BLOOD LEFT FOREARM   Final   Special Requests BOTTLES DRAWN AEROBIC ONLY    Final   Culture  Setup Time 07/05/2012 18:46   Final   Culture     Final   Value:        BLOOD CULTURE RECEIVED NO GROWTH TO DATE CULTURE WILL BE HELD FOR 5 DAYS BEFORE ISSUING A FINAL NEGATIVE REPORT   Report Status PENDING   Incomplete     Studies: Mr Shirlee Latch XB Contrast  07/07/2012   *RADIOLOGY REPORT*  Clinical Data: Altered mental status.  Left frontal infarction.  MRA HEAD WITHOUT CONTRAST  Technique: Angiographic images of the Circle of Willis were obtained using MRA technique without intravenous contrast.  Comparison: MRI same day  Findings: Both internal carotid arteries are widely patent into the brain.  No siphon stenosis.  The anterior and middle cerebral vessels are patent bilaterally.  There is focal stenosis of the right A1 segment estimated  at 50%.  No stenosis is seen on the left.  Both middle cerebral arteries are patent but the distal vessel shows some atherosclerotic irregularity.  Both vertebral arteries are patent with the right being dominant. No basilar stenosis.  Posterior circulation branch vessels are patent with the right PCA receiving most of its supply from the anterior circulation.  IMPRESSION: No major vessel occlusion or correctable proximal stenosis. Specifically, the left anterior cerebral artery is patent.  Some atherosclerotic irregularity in the MCA branches distally.  50% stenosis of the right A1 segment.   Original Report Authenticated By: Paulina Fusi, M.D.   Mr Laqueta Jean Wo Contrast  07/07/2012   *RADIOLOGY REPORT*  Clinical Data: Non-small cell lung cancer.  Altered mental status. Question infarct?Marland Kitchen  MRI HEAD WITHOUT AND WITH CONTRAST  Technique:  Multiplanar, multiecho pulse sequences of the brain and surrounding structures were obtained according to standard protocol without and with intravenous contrast  Contrast: 11mL MULTIHANCE GADOBENATE DIMEGLUMINE 529 MG/ML IV SOLN  Comparison: 07/05/2012 and 04/16/2012 CT.  No comparison MR.  Findings: Moderate sized acute/subacute non hemorrhagic left frontal lobe infarct bordering the anterior left opercular region and sub insular region.  Multiple intracranial metastatic lesions some which are hemorrhagic.  All the following measurements on series 10 and compared to the post contrast CT from 04/16/2012:  Anterior left frontal lobe 2.3 x 1.7 cm ( image 39) versus prior 2.5 x 2.1 cm.  Anterior right frontal lobe 3 mm lesion (image 39) not detected by CT.  Right frontal 9.4 x 7.6 mm ring enhancing lesion (image 32) previously 12.5 x 9.9 mm.  Left caudate head 2.2 x 1.8 cm ring enhancing lesion (image 28) previously 2.5 x 1.9 cm.  Posterior right opercular 1.6 x 1.5 cm irregular enhancing lesion (image 25) previously 2.5 x 2 cm.  Right occipital lobe 10.6 x 7.3 mm lesion (image 25)  previously 18.7 x 15.8 mm.  Left occipital lobe 1.3 x 1.3 cm ring enhancing lesion (image 18) previously 1.8 x 1.8 cm.  Left cerebellar 1.3 x 1.4 cm lesion (image 12) previously 2.3 x 1.2 cm.  Inferior cerebellar 1.5 x 1 cm lesion (image 7) previously 2.6 x 2 cm.  Left pontine 7 x 6 mm area of enhancement (image 13) not appreciate on prior CT.  Therefore, majority of the intracranial metastatic lesions have decreased in size as has the amount of surrounding vasogenic edema with the largest lesion remaining within the left frontal lobe.  Interval clearing of midline shift noted on the 04/16/2012 exam.  Atrophy without hydrocephalus.  Major intracranial vascular structures are patent.  Transverse  ligament hypertrophy.  Polypoid opacification right maxillary sinus.  Mild mucosal thickening right sphenoid sinus.  Partial opacification left mastoid air cells.  IMPRESSION: Moderate sized acute/subacute non hemorrhagic left frontal lobe infarct bordering the anterior left opercular region and sub insular region.  Multiple intracranial metastatic lesions some which are hemorrhagic.  A majority of the intracranial metastatic lesions have decreased in size compared to the 04/16/2012 contrast enhanced CT examination as detailed above.  This has been made a PRA call report utilizing dashboard call feature.   Original Report Authenticated By: Lacy Duverney, M.D.   2-D echo Study Conclusions  - Left ventricle: The cavity size was normal. Systolic function was normal. The estimated ejection fraction was in the range of 60% to 65%. Wall motion was normal; there were no regional wall motion abnormalities. Doppler parameters are consistent with abnormal left ventricular relaxation (grade 1 diastolic dysfunction). - Aortic valve: Mild regurgitation. - Mitral valve: Mild regurgitation   Additional labs:   Scheduled Meds: . cefUROXime  500 mg Oral BID WC  . dexamethasone  2 mg Oral Q12H  . feeding supplement  237 mL Oral  TID BM  . folic acid  1 mg Oral Daily  . mirtazapine  30 mg Oral QHS  . morphine  15 mg Oral BID  . multivitamin with minerals  1 tablet Oral Daily  . nutrition supplement  1 packet Oral BID PC   Continuous Infusions:    Principal Problem:   Healthcare-associated pneumonia Active Problems:   Non-small cell cancer of left lung   Ulcer mouth   DVT (deep venous thrombosis), left   Leukocytosis   Fever   Malnutrition of moderate degree   Anemia   Acute encephalopathy   CVA (cerebral infarction)   Expressive aphasia   Brain metastasis   Acute bronchitis    Time spent: 25 minutes    [facility]  [facility] Pager [phone].   If 8PM-8AM, please contact night-coverage at [email or web address], password [facility] 07/09/2012, 11:43 AM  LOS: 4 days

## 2012-07-09 NOTE — Progress Notes (Signed)
Clinical Social Work Department BRIEF PSYCHOSOCIAL ASSESSMENT 07/09/2012  Patient:  Isabella Carpenter, Isabella Carpenter     Account Number:  [id number]     Admit date:  07/05/2012  Clinical Social Worker:  Orpah Greek  Date/Time:  07/09/2012 03:41 PM  Referred by:  Physician  Date Referred:  07/09/2012 Referred for  Residential hospice placement   Other Referral:   Interview type:  Family Other interview type:   daughters, Tracey & Mandy at bedside    PSYCHOSOCIAL DATA Living Status:   Admitted from facility:   Level of care:   Primary support name:  Ree Edman (daughter) cell#: [phone] Primary support relationship to patient:  CHILD, ADULT Degree of support available:   good    CURRENT CONCERNS Current Concerns  Post-Acute Placement   Other Concerns:    SOCIAL WORK ASSESSMENT / PLAN CSW received referral from Dr. Ladona Ridgel that family is interested in Residential Hospice placement.   Assessment/plan status:  Information/Referral to [facility] Other assessment/ plan:   Information/referral to community resources:   CSW faxed referral to [facility], spoke with Lafonda Mosses who will be getting in touch with the family. CSW will follow-up re: bed availability.    PATIENT'S/FAMILY'S RESPONSE TO PLAN OF CARE: CSW confirmed with daughters at bedside that they are interested in [facility]. Daughters state that they've lived in [place] and are familiar with [facility] [place].       Unice Bailey, LCSW Clinical Social Worker cell #: [phone]

## 2012-07-09 NOTE — Progress Notes (Signed)
Patient AV:[id number] [place]      DOB: 1945/08/07      BJY:[id number]   Palliative Medicine Team at [facility] Progress Note    Subjective: Isabella Carpenter remains quietly resting.  She will rouse when stimulate , but is slow to do so.  She can't not identify time or place.  No family at the bedside.  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  Filed Vitals:   07/09/12 1500  BP: 107/52  Pulse: 73  Temp: 99.6 F (37.6 C)  Resp: 16   Physical exam: General: flat affect,  No acute distress Mild alopecia, PERRL. EOMI, mm Chest: decreased, no rhonchi rales or wheezes YQM:VHQIONG rate and rhythm, S1, S2 EXB:MWUX, not tender, positive bowel sounds Ext: warm, no edema Neuro: intermittently somnolent,  But can rouse, not eating   Assessment and plan: 67 yr old white female with stage IV lung cancer metastatic to the brain.  Admitted with confusion and fever found to have had a frontal stroke, with hemorrhagic changes to her metastatic lesions.   1.  DNR  2.   Altered mental status:  Secondary to metastatic disesase. Continue decadron.  3.  Anorexia: comfort feed.  Prognosis:  Less than six months , likely weeks Disposition to Residential Hospice home.  Total time 15 min  Isabella Hackler L. Ladona Ridgel, MD MBA The Palliative Medicine Team at [facility] Phone: [phone] Pager: [phone]

## 2012-07-10 ENCOUNTER — Telehealth: Payer: Self-pay | Admitting: *Deleted

## 2012-07-10 MED ORDER — MIRTAZAPINE 15 MG PO TABS: 15.0000 mg | ORAL_TABLET | Freq: Every day | ORAL | Status: AC

## 2012-07-10 MED ORDER — DEXAMETHASONE 2 MG PO TABS: 2.0000 mg | ORAL_TABLET | Freq: Two times a day (BID) | ORAL | Status: AC

## 2012-07-10 MED ORDER — JUVEN PO PACK: 1.0000 | PACK | Freq: Two times a day (BID) | ORAL | Status: AC

## 2012-07-10 MED ORDER — MORPHINE SULFATE ER 15 MG PO TBCR: 15.0000 mg | EXTENDED_RELEASE_TABLET | Freq: Two times a day (BID) | ORAL | Status: AC

## 2012-07-10 MED ORDER — ENSURE COMPLETE PO LIQD: 237.0000 mL | Freq: Three times a day (TID) | ORAL | Status: AC

## 2012-07-10 NOTE — Discharge Summary (Addendum)
Physician Discharge Summary  Isabella Carpenter WUJ:[id number] DOB: Apr 30, 1945 DOA: 07/05/2012  PCP: Kriste Basque R., DO  Admit date: 07/05/2012 Discharge date: 07/10/2012  Time spent: Greater than 30 minutes  Recommendations for Outpatient Follow-up:  1. Dr. Kriste Basque, PCP- as needed  Discharge Diagnoses:  Principal Problem:   Healthcare-associated pneumonia Active Problems:   Non-small cell cancer of left lung   Ulcer mouth   DVT (deep venous thrombosis), left   Leukocytosis   Fever   Malnutrition of moderate degree   Anemia   Acute encephalopathy   CVA (cerebral infarction)   Expressive aphasia   Brain metastasis   Acute bronchitis   Discharge Condition: Improved & Stable  Diet recommendation: Regular diet  Filed Weights   07/07/12 0404 07/09/12 0500 07/10/12 0500  Weight: 54.341 kg (119 lb 12.8 oz) 52.3 kg (115 lb 4.8 oz) 52.1 kg (114 lb 13.8 oz)    History of present illness:  67 y.o. 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 She has progressively gotten worse to the point where she can even get out of bed without assistance. The history was obtained from the family as the patient was lethargic. Her daughter stated that she started having fever with decreased appetite, ongoing progressive confusion, and productive cough for the last 3 days. She's also been getting short of breath with ambulation with less than 20 feet. The morning of admission she couldn't get out of bed even with assistance so they decided to bring her into the hospital.   Hospital Course:  1. Acute left frontal lobe ischemic stroke with marked aphasia: This was probably the most important cause for her admitting presentation of confusion. Neurology was consulted.  Given her poor prognosis from advanced lung cancer and hemorrhagic brain metastasis, palliative care was consulted and family opted for comfort oriented care. Patient will be discharging to residential hospice. No aggressive intervention for her stroke. 2. Acute encephalopathy: Likely secondary to acute stroke and hemorrhagic brain metastasis. Comfort oriented care. Patient continues to be lethargic and somnolent. Continue Decadron. We'll reduce dose of Remeron and hopefully she will be more alert. 3. Lung cancer with hemorrhagic brain metastasis: Oncology consulted and agreed on hospice care. No further treatment. 4. Recent left lower extremity DVT: Patient was on Coumadin prior to admission. Her Coumadin was discontinued secondary to hemorrhagic brain metastasis. 5. Possible acute purulent bronchitis: Patient was initially admitted as presumed healthcare associated pneumonia. However chest x-ray did not reveal pneumonia. She has completed total 6 days of antibiotics and she will not be discharged on any further antibiotics. 6. Anemia: Secondary to chronic disease, cancer and cancer chemotherapy. Stable. 7. Leukocytosis: Secondary to steroids. Stable. 8. Dehydration: Resolved. Patient height is for recurrent dehydration. 9. Moderate protein calorie malnutrition: Continue nutritional supplements.  10. Newly diagnosed type II DM 11. DO NOT RESUSCITATE  Procedures:  None    Consultations:   Oncology    Palliative care team  Discharge Exam:  Complaints: Patient is lethargic and somnolent but easily arousable and denies complaints.   Filed Vitals:   07/09/12 0500 07/09/12 1500 07/09/12 2041 07/10/12 0500  BP: 116/65 107/52 125/67 124/71  Pulse: 62 73 66 62  Temp: 98.7 F (37.1 C) 99.6 F (37.6 C) 98.2 F (36.8 C) 97.4 F (36.3 C)  TempSrc: Oral Axillary Axillary Oral  Resp: 18 16 14 16   Height:      Weight: 52.3 kg (115 lb 4.8 oz)   52.1 kg (114 lb 13.8 oz)  SpO2: 100% 97% 98% 99%      General exam: Comfortably lying in bed.   Respiratory system: Poor inspiratory effort. Reduced breath sounds in the bases. No increased work of breathing.   Cardiovascular system: S1 & S2 heard, RRR. No JVD, murmurs, gallops, clicks or pedal edema.   Gastrointestinal system: Abdomen is nondistended, soft and nontender. Normal bowel sounds heard.   Central nervous system: Somnolent, briefly opens eyes to touch but drifts back to sleep. Severe aphasia.   Discharge Instructions      Discharge Orders   Future Appointments Provider Department Dept Phone   07/13/2012 10:15 AM [facility] [phone]   07/13/2012 11:00 AM [facility] 2 [facility] [phone]   Patient to arrive 15 minutes prior to appointment time. Patient to pick up oral contrast at least 1 day prior to exam, unless otherwise instructed by your physician. No solid food 4 hours prior to exam. Liquids and Medicines are okay.   07/13/2012 12:15 PM Si Gaul, MD [facility] MEDICAL ONCOLOGY [phone]   07/20/2012 9:00 AM [facility] Only [facility] [phone]   07/27/2012 9:00 AM [facility] [phone]   07/27/2012 9:30 AM [facility] [phone]   08/03/2012 9:15 AM [facility] Only [facility] [phone]   08/10/2012 9:00 AM [facility] Only [facility] [phone]   08/18/2012 10:00 AM [facility] 1 [place] [facility] AT [address] [phone]   Patient to arrive 15 minutes prior to appointment time.   08/21/2012 8:30 AM Lonie Peak, MD [facility] RADIATION ONCOLOGY [phone]   Future Orders Complete By Expires     Call MD for:  difficulty breathing, headache or visual disturbances  As directed     Call MD for:   persistant nausea and vomiting  As directed     Call MD for:  severe uncontrolled pain  As directed     Call MD for:  temperature >100.4  As directed     Diet general  As directed     Increase activity slowly  As directed         Medication List    STOP taking these medications       moxifloxacin 400 MG tablet  Commonly known as:  AVELOX     PRESCRIPTION MEDICATION     prochlorperazine 10 MG tablet  Commonly known as:  COMPAZINE     warfarin 4 MG tablet  Commonly known as:  COUMADIN      TAKE these medications       dexamethasone 2 MG tablet  Commonly known as:  DECADRON  Take 1 tablet (2 mg total) by mouth every 12 (twelve) hours.     feeding supplement Liqd  Take 237 mLs by mouth 3 (three) times daily between meals.     nutrition supplement Pack  Take 1 packet by mouth 2 (two) times daily after a meal.     folic acid 1 MG tablet  Commonly known as:  FOLVITE  Take 1 tablet (1 mg total) by mouth daily.     magic mouthwash w/lidocaine Soln  Swish and swallow 5 mLs 4 (four) times daily as needed (for thrush).     mirtazapine 15 MG tablet  Commonly known as:  REMERON  Take 1 tablet (15 mg total) by mouth at bedtime.     morphine 15 MG 12 hr tablet  Commonly known as:  MS CONTIN  Take 1 tablet (15 mg total) by mouth 2 (two) times daily.     multivitamin with minerals Tabs  Take 1 tablet by mouth daily.       Follow-up Information   Schedule an appointment as soon as possible for a visit with [name], DO. (As needed)    Contact information:   [address] Christena Flake [place] [address] [phone]        The results of significant diagnostics from this hospitalization (including imaging, microbiology, ancillary and laboratory) are listed below for reference.    Significant Diagnostic Studies: Dg Chest 2 View  07/05/2012   *RADIOLOGY REPORT*  Clinical Data: Altered mental status, lung cancer.  CHEST - 2 VIEW  Comparison: 05/25/2012 and CT chest  04/16/2012.  Findings: Trachea is midline.  Heart size normal.  Mass like opacities are seen at the left lung apex and right lung base, as before.  No pleural fluid.  IMPRESSION: Bilateral pulmonary masses, as before.  Additional smaller nodular pulmonary metastases are better seen on 04/16/2012.   Original Report Authenticated By: Leanna Battles, M.D.   Ct Head Wo Contrast  07/05/2012   *RADIOLOGY REPORT*  Clinical Data: History of lung cancer and known metastatic disease to the brain, post whole brain radiation, now with altered level of consciousness and generalized weakness, ongoing chemotherapy  CT HEAD WITHOUT CONTRAST  Technique:  Contiguous axial images were obtained from the base of the skull through the vertex without contrast.  Comparison: 04/16/2012  Findings:  Overall improved appearance of the brain with resolution of previously noted 7 mm of left to right midline shift.  There is persistent vasogenic edema involving the left frontal lobe ( images 18-20).  Geographic areas of decreased attenuation involving the subcortical aspects of the right frontal lobe (image 16, series 2) and right parietal occipital lobe (image 18) are more conspicuous on the present examination though this is likely secondary to marked degradation on the prior examination due to patient motion artifact.  Grossly unchanged appearance of geographic hypodensities within the left cerebellar lobe (images 8 and 10).  There given background parenchymal abnormalities, there is no CT evidence of acute large territory infarct.  No intraparenchymal or extra-axial hemorrhage.  No definite extra-axial mass.  Limited visualization of the paranasal sinuses demonstrates mucosal thickening within the right maxillary sinus.  Remaining paranasal sinuses and mastoid air cells are normally aerated.  Regional soft tissues are normal.  No displaced calvarial fracture.  IMPRESSION: 1.  Overall improved appearance of the brain without definite acute  intracranial process. 2.  Interval resolution of previously noted 7 mm of left to right midline shift.  3.  Persistent findings of known bilateral cerebral and left cerebellar metastases, incompletely evaluated on this noncontrast examination, although likely unchanged.   Original Report Authenticated By: Tacey Ruiz, MD   Mr [facility] Wo Contrast  07/07/2012   *RADIOLOGY REPORT*  Clinical Data: Altered mental status.  Left frontal infarction.  MRA HEAD WITHOUT CONTRAST  Technique: Angiographic images of the Circle of Willis were obtained using MRA technique without intravenous contrast.  Comparison: MRI same day  Findings: Both internal carotid arteries are widely  patent into the brain.  No siphon stenosis.  The anterior and middle cerebral vessels are patent bilaterally.  There is focal stenosis of the right A1 segment estimated at 50%.  No stenosis is seen on the left.  Both middle cerebral arteries are patent but the distal vessel shows some atherosclerotic irregularity.  Both vertebral arteries are patent with the right being dominant. No basilar stenosis.  Posterior circulation branch vessels are patent with the right PCA receiving most of its supply from the anterior circulation.  IMPRESSION: No major vessel occlusion or correctable proximal stenosis. Specifically, the left anterior cerebral artery is patent.  Some atherosclerotic irregularity in the MCA branches distally.  50% stenosis of the right A1 segment.   Original Report Authenticated By: Paulina Fusi, M.D.   Mr Laqueta Jean Wo Contrast  07/07/2012   *RADIOLOGY REPORT*  Clinical Data: Non-small cell lung cancer.  Altered mental status. Question infarct?Marland Kitchen  MRI HEAD WITHOUT AND WITH CONTRAST  Technique:  Multiplanar, multiecho pulse sequences of the brain and surrounding structures were obtained according to standard protocol without and with intravenous contrast  Contrast: 11mL MULTIHANCE GADOBENATE DIMEGLUMINE 529 MG/ML IV SOLN  Comparison: 07/05/2012  and 04/16/2012 CT.  No comparison MR.  Findings: Moderate sized acute/subacute non hemorrhagic left frontal lobe infarct bordering the anterior left opercular region and sub insular region.  Multiple intracranial metastatic lesions some which are hemorrhagic.  All the following measurements on series 10 and compared to the post contrast CT from 04/16/2012:  Anterior left frontal lobe 2.3 x 1.7 cm ( image 39) versus prior 2.5 x 2.1 cm.  Anterior right frontal lobe 3 mm lesion (image 39) not detected by CT.  Right frontal 9.4 x 7.6 mm ring enhancing lesion (image 32) previously 12.5 x 9.9 mm.  Left caudate head 2.2 x 1.8 cm ring enhancing lesion (image 28) previously 2.5 x 1.9 cm.  Posterior right opercular 1.6 x 1.5 cm irregular enhancing lesion (image 25) previously 2.5 x 2 cm.  Right occipital lobe 10.6 x 7.3 mm lesion (image 25) previously 18.7 x 15.8 mm.  Left occipital lobe 1.3 x 1.3 cm ring enhancing lesion (image 18) previously 1.8 x 1.8 cm.  Left cerebellar 1.3 x 1.4 cm lesion (image 12) previously 2.3 x 1.2 cm.  Inferior cerebellar 1.5 x 1 cm lesion (image 7) previously 2.6 x 2 cm.  Left pontine 7 x 6 mm area of enhancement (image 13) not appreciate on prior CT.  Therefore, majority of the intracranial metastatic lesions have decreased in size as has the amount of surrounding vasogenic edema with the largest lesion remaining within the left frontal lobe.  Interval clearing of midline shift noted on the 04/16/2012 exam.  Atrophy without hydrocephalus.  Major intracranial vascular structures are patent.  Transverse ligament hypertrophy.  Polypoid opacification right maxillary sinus.  Mild mucosal thickening right sphenoid sinus.  Partial opacification left mastoid air cells.  IMPRESSION: Moderate sized acute/subacute non hemorrhagic left frontal lobe infarct bordering the anterior left opercular region and sub insular region.  Multiple intracranial metastatic lesions some which are hemorrhagic.  A majority of  the intracranial metastatic lesions have decreased in size compared to the 04/16/2012 contrast enhanced CT examination as detailed above.  This has been made a PRA call report utilizing dashboard call feature.   Original Report Authenticated By: Lacy Duverney, M.D.   2-D echo  Study Conclusions  - Left ventricle: The cavity size was normal. Systolic function was normal. The estimated ejection fraction was  in the range of 60% to 65%. Wall motion was normal; there were no regional wall motion abnormalities. Doppler parameters are consistent with abnormal left ventricular relaxation (grade 1 diastolic dysfunction). - Aortic valve: Mild regurgitation. - Mitral valve: Mild regurgitation  Microbiology: Recent Results (from the past 240 hour(s))  CULTURE, BLOOD (ROUTINE X 2)     Status: None   Collection Time    07/05/12 12:45 PM      Result Value Range Status   Specimen Description BLOOD LEFT ANTECUBITAL   Final   Special Requests BOTTLES DRAWN AEROBIC AND ANAEROBIC   Final   Culture  Setup Time 07/05/2012 18:46   Final   Culture     Final   Value:        BLOOD CULTURE RECEIVED NO GROWTH TO DATE CULTURE WILL BE HELD FOR 5 DAYS BEFORE ISSUING A FINAL NEGATIVE REPORT   Report Status PENDING   Incomplete  CULTURE, BLOOD (ROUTINE X 2)     Status: None   Collection Time    07/05/12 12:50 PM      Result Value Range Status   Specimen Description BLOOD LEFT FOREARM   Final   Special Requests BOTTLES DRAWN AEROBIC ONLY   Final   Culture  Setup Time 07/05/2012 18:46   Final   Culture     Final   Value:        BLOOD CULTURE RECEIVED NO GROWTH TO DATE CULTURE WILL BE HELD FOR 5 DAYS BEFORE ISSUING A FINAL NEGATIVE REPORT   Report Status PENDING   Incomplete     Labs: Basic Metabolic Panel:  Recent Labs Lab 07/05/12 1250 07/06/12 0345  NA 136 136  K 4.0 4.5  CL 101 104  CO2 24 26  GLUCOSE 112* 96  BUN 10 9  CREATININE 0.74 0.67  CALCIUM 8.6 8.3*   Liver Function Tests:  Recent  Labs Lab 07/05/12 1250  AST 17  ALT 22  ALKPHOS 82  BILITOT 0.6  PROT 6.0  ALBUMIN 2.8*   No results found for this basename: LIPASE, AMYLASE,  in the last 168 hours  Recent Labs Lab 07/06/12 1434  AMMONIA 10*   CBC:  Recent Labs Lab 07/05/12 1250 07/06/12 0345 07/07/12 0354  WBC 13.6* 11.3* 11.6*  NEUTROABS 12.1*  --   --   HGB 10.6* 9.6* 9.3*  HCT 31.9* 28.9* 27.7*  MCV 90.9 90.3 90.8  PLT 548* 552* 610*   Cardiac Enzymes: No results found for this basename: CKTOTAL, CKMB, CKMBINDEX, TROPONINI,  in the last 168 hours BNP: BNP (last 3 results) No results found for this basename: PROBNP,  in the last 8760 hours CBG:  Recent Labs Lab 07/05/12 1240  GLUCAP 110*    Additional labs:  Fasting lipid panel: Cholesterol 206, triglycerides 148, HDL 45, LDL 131 and VLDL 30.  Vitamin B12: 580  TSH: 0.327  Hemoglobin A1c: 6.5  RPR nonreactive  HIV nonreactive   UA: Negative   Signed:  Nas Wafer  [facility] 07/10/2012, 1:58 PM

## 2012-07-10 NOTE — Telephone Encounter (Signed)
Pt is on hospice, spoke to pt's family member, will cancel appts on Thursday.  SLJ

## 2012-07-10 NOTE — Progress Notes (Signed)
PT Cancellation Note  Patient Details Name: Isabella Carpenter MRN: [id number] DOB: December 01, 1945   Cancelled Treatment:    Reason Eval/Treat Not Completed: Fatigue/lethargy limiting ability to participate   Donnetta Hail 07/10/2012, 4:07 PM

## 2012-07-10 NOTE — Progress Notes (Signed)
SLP Cancellation Note  Patient Details Name: Isabella Carpenter MRN: [id number] DOB: August 10, 1945   Cancelled treatment:       Reason Eval/Treat Not Completed: Fatigue/lethargy limiting ability to participate  Note from review of MD notes, pt lethargic.  Will re-attempt next date for family education re: pt's aphasia.   Donavan Burnet, MS [facility] SLP [phone]

## 2012-07-10 NOTE — Progress Notes (Signed)
Subjective: The patient is seen and examined today. She is still confused. Recent MRI of the brain showed hemorrhagic brain metastasis. Her Coumadin was discontinued. The patient continues to have significant fatigue and weakness and was unable to move to the bedside chair. She denied having any fever or chills. She continues to have shortness breath with exertion.  Objective: Vital signs in last 24 hours: Temp:  [97.4 F (36.3 C)-99.6 F (37.6 C)] 97.4 F (36.3 C) (06/30 0500) Pulse Rate:  [62-73] 62 (06/30 0500) Resp:  [14-16] 16 (06/30 0500) BP: (107-125)/(52-71) 124/71 mmHg (06/30 0500) SpO2:  [97 %-99 %] 99 % (06/30 0500) Weight:  [114 lb 13.8 oz (52.1 kg)] 114 lb 13.8 oz (52.1 kg) (06/30 0500)  Intake/Output from previous day: 06/29 0701 - 06/30 0700 In: 0  Out: 825 [Urine:825] Intake/Output this shift:    General appearance: alert, distracted, fatigued, no distress and slowed mentation Resp: clear to auscultation bilaterally Cardio: regular rate and rhythm, S1, S2 normal, no murmur, click, rub or gallop GI: soft, non-tender; bowel sounds normal; no masses,  no organomegaly Extremities: extremities normal, atraumatic, no cyanosis or edema  Lab Results:  No results found for this basename: WBC, HGB, HCT, PLT,  in the last 72 hours BMET No results found for this basename: NA, K, CL, CO2, GLUCOSE, BUN, CREATININE, CALCIUM,  in the last 72 hours  Studies/Results: No results found.  Medications: I have reviewed the patient's current medications.   Assessment/Plan: This is a very pleasant 67 years old white female with metastatic non-small cell lung cancer with multiple brain metastasis status post whole brain irradiation followed by 2 cycles of systemic chemotherapy with carboplatin and Alimta. Unfortunately the patient has significant deterioration in her general and mental status secondary to the hemorrhagic brain metastasis. Anticoagulation was discontinued.  The patient  and her family are looking for more palliative care at this point. I agree with this options especially with the significant deterioration in her general condition. Thank you for taking good care of Ms. Hart Rochester. I will with the patient on as-needed basis. Please call if you have any questions.  LOS: 5 days    Cadence Haslam K. 07/10/2012

## 2012-07-10 NOTE — Progress Notes (Signed)
Pt was stable at time of discharge. IV removed. Discharge packet given to EMTs. Reported off to RN at [facility].

## 2012-07-10 NOTE — Progress Notes (Signed)
Pt to be d/c to [facility] today via [facility] transport. Pt/family aware and in agreement with d/c plan.   Cori Razor LCSW [phone]

## 2012-07-11 LAB — CULTURE, BLOOD (ROUTINE X 2): Culture: NO GROWTH

## 2012-07-13 ENCOUNTER — Ambulatory Visit: Payer: Commercial Managed Care - PPO | Admitting: Internal Medicine

## 2012-07-13 ENCOUNTER — Other Ambulatory Visit: Payer: Commercial Managed Care - PPO

## 2012-07-13 ENCOUNTER — Inpatient Hospital Stay (HOSPITAL_COMMUNITY): Payer: Commercial Managed Care - PPO

## 2012-07-17 ENCOUNTER — Encounter: Payer: Self-pay | Admitting: Internal Medicine

## 2012-07-17 NOTE — Progress Notes (Signed)
Called [employer] to see if the patient was approved for assistance with Alimta. Per Gaylyn Rong she was denied because her insurance had chemo assistance. I will shred the application that was no file.

## 2012-07-19 ENCOUNTER — Telehealth: Payer: Self-pay | Admitting: Dietician

## 2012-07-20 ENCOUNTER — Other Ambulatory Visit: Payer: Commercial Managed Care - PPO | Admitting: Lab

## 2012-07-27 ENCOUNTER — Ambulatory Visit: Payer: Commercial Managed Care - PPO

## 2012-07-27 ENCOUNTER — Other Ambulatory Visit: Payer: Commercial Managed Care - PPO | Admitting: Lab

## 2012-08-03 ENCOUNTER — Other Ambulatory Visit: Payer: Commercial Managed Care - PPO

## 2012-08-10 ENCOUNTER — Other Ambulatory Visit: Payer: Commercial Managed Care - PPO

## 2012-08-11 ENCOUNTER — Encounter: Payer: Self-pay | Admitting: Radiation Therapy

## 2012-08-11 NOTE — Progress Notes (Signed)
Pt. Expired on 08-18-12

## 2012-08-11 DEATH — deceased

## 2012-08-18 ENCOUNTER — Other Ambulatory Visit: Payer: Commercial Managed Care - PPO

## 2012-08-21 ENCOUNTER — Ambulatory Visit
Admission: RE | Admit: 2012-08-21 | Payer: Commercial Managed Care - PPO | Source: Ambulatory Visit | Admitting: Radiation Oncology

## 2013-01-29 ENCOUNTER — Encounter: Payer: Self-pay | Admitting: Internal Medicine

## 2013-01-29 NOTE — Progress Notes (Signed)
Faxed itemized bills to [facility] @ [phone].

## 2013-09-07 ENCOUNTER — Other Ambulatory Visit: Payer: Self-pay | Admitting: Pharmacist

## 2015-03-25 IMAGING — CR DG CHEST 2V
2 series · 2 of 2 positions shown · non-contrast
Comparison: None.

CLINICAL DATA: Chest pain.  Neck pain.  Tobacco use.

CHEST - 2 VIEW

[w chest pa]
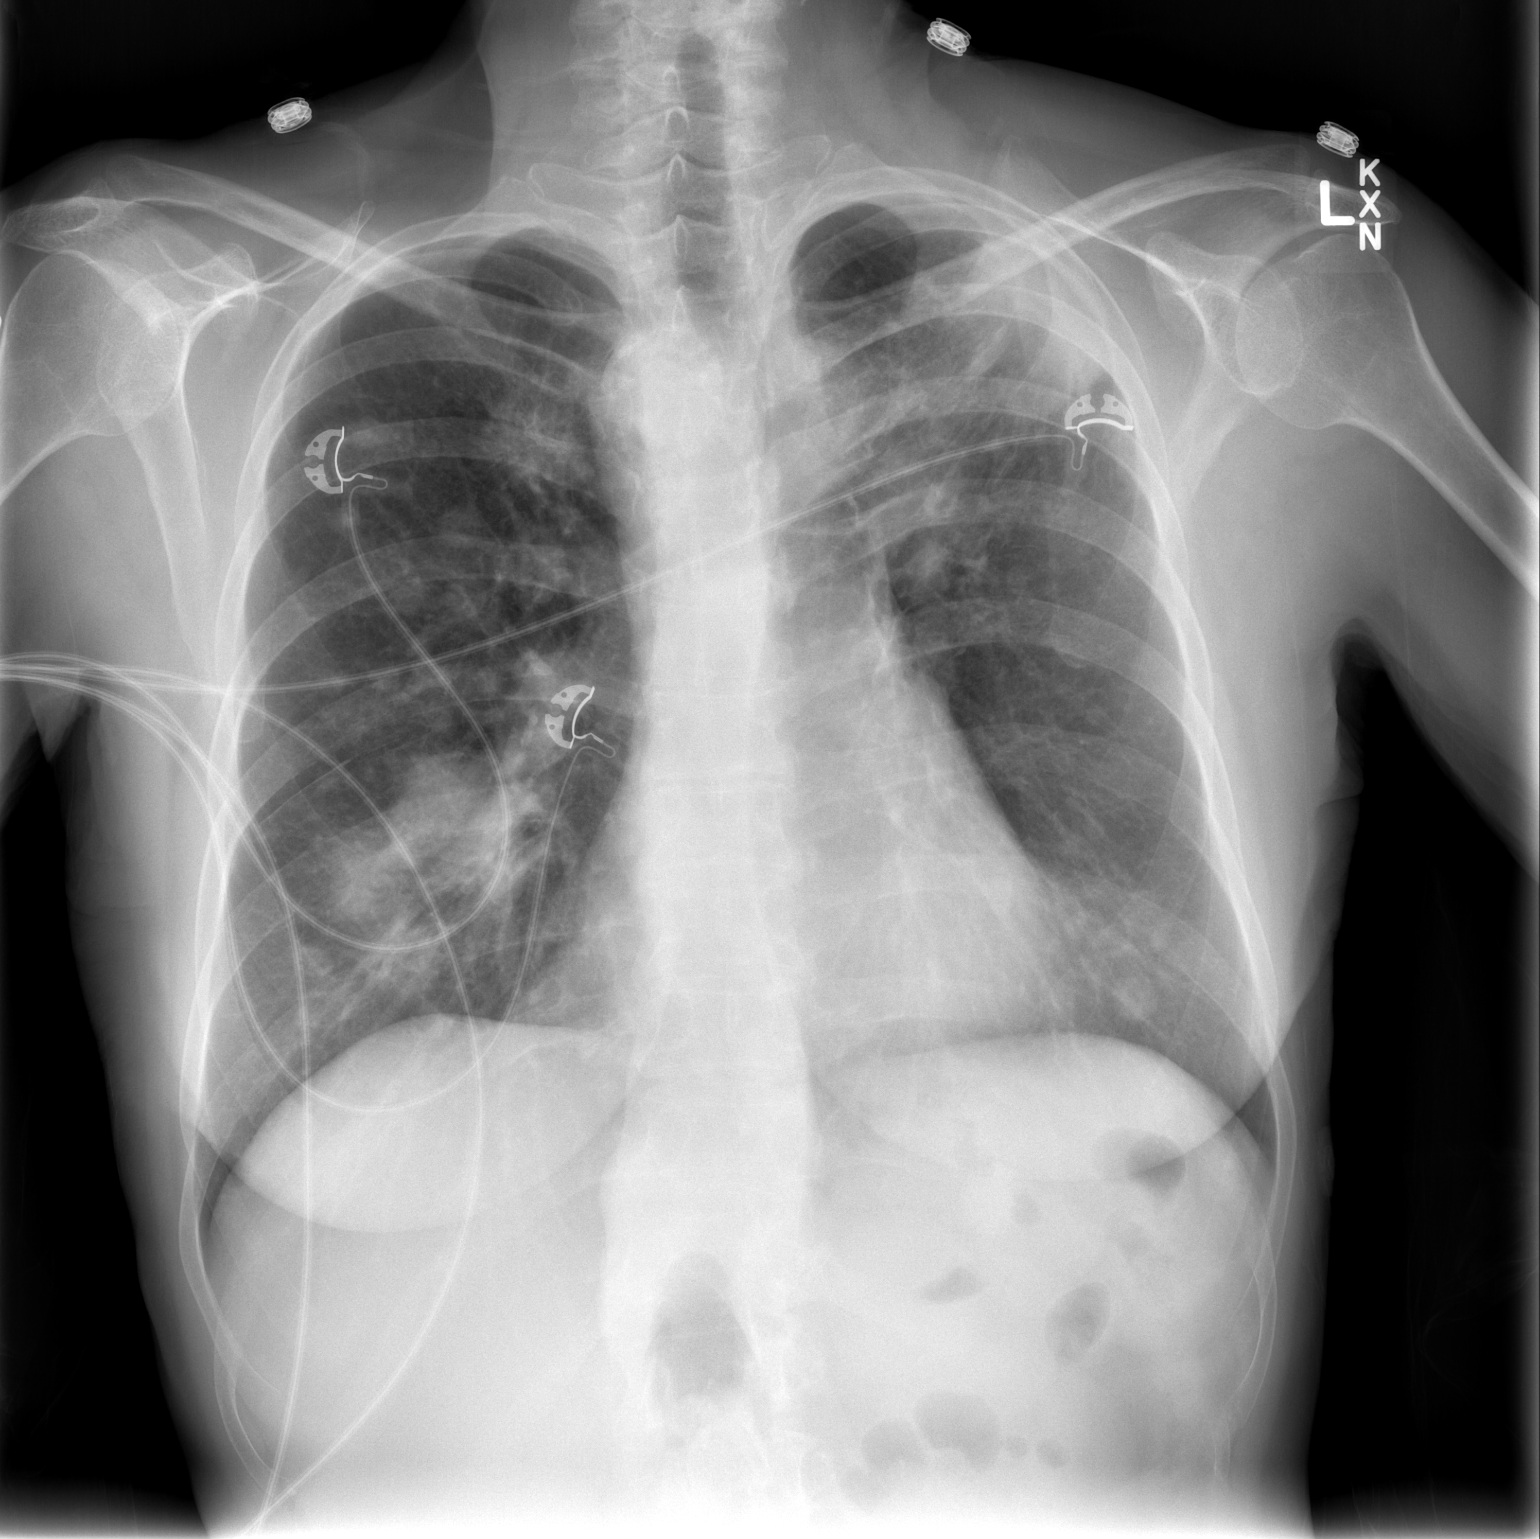

[w chest lat]
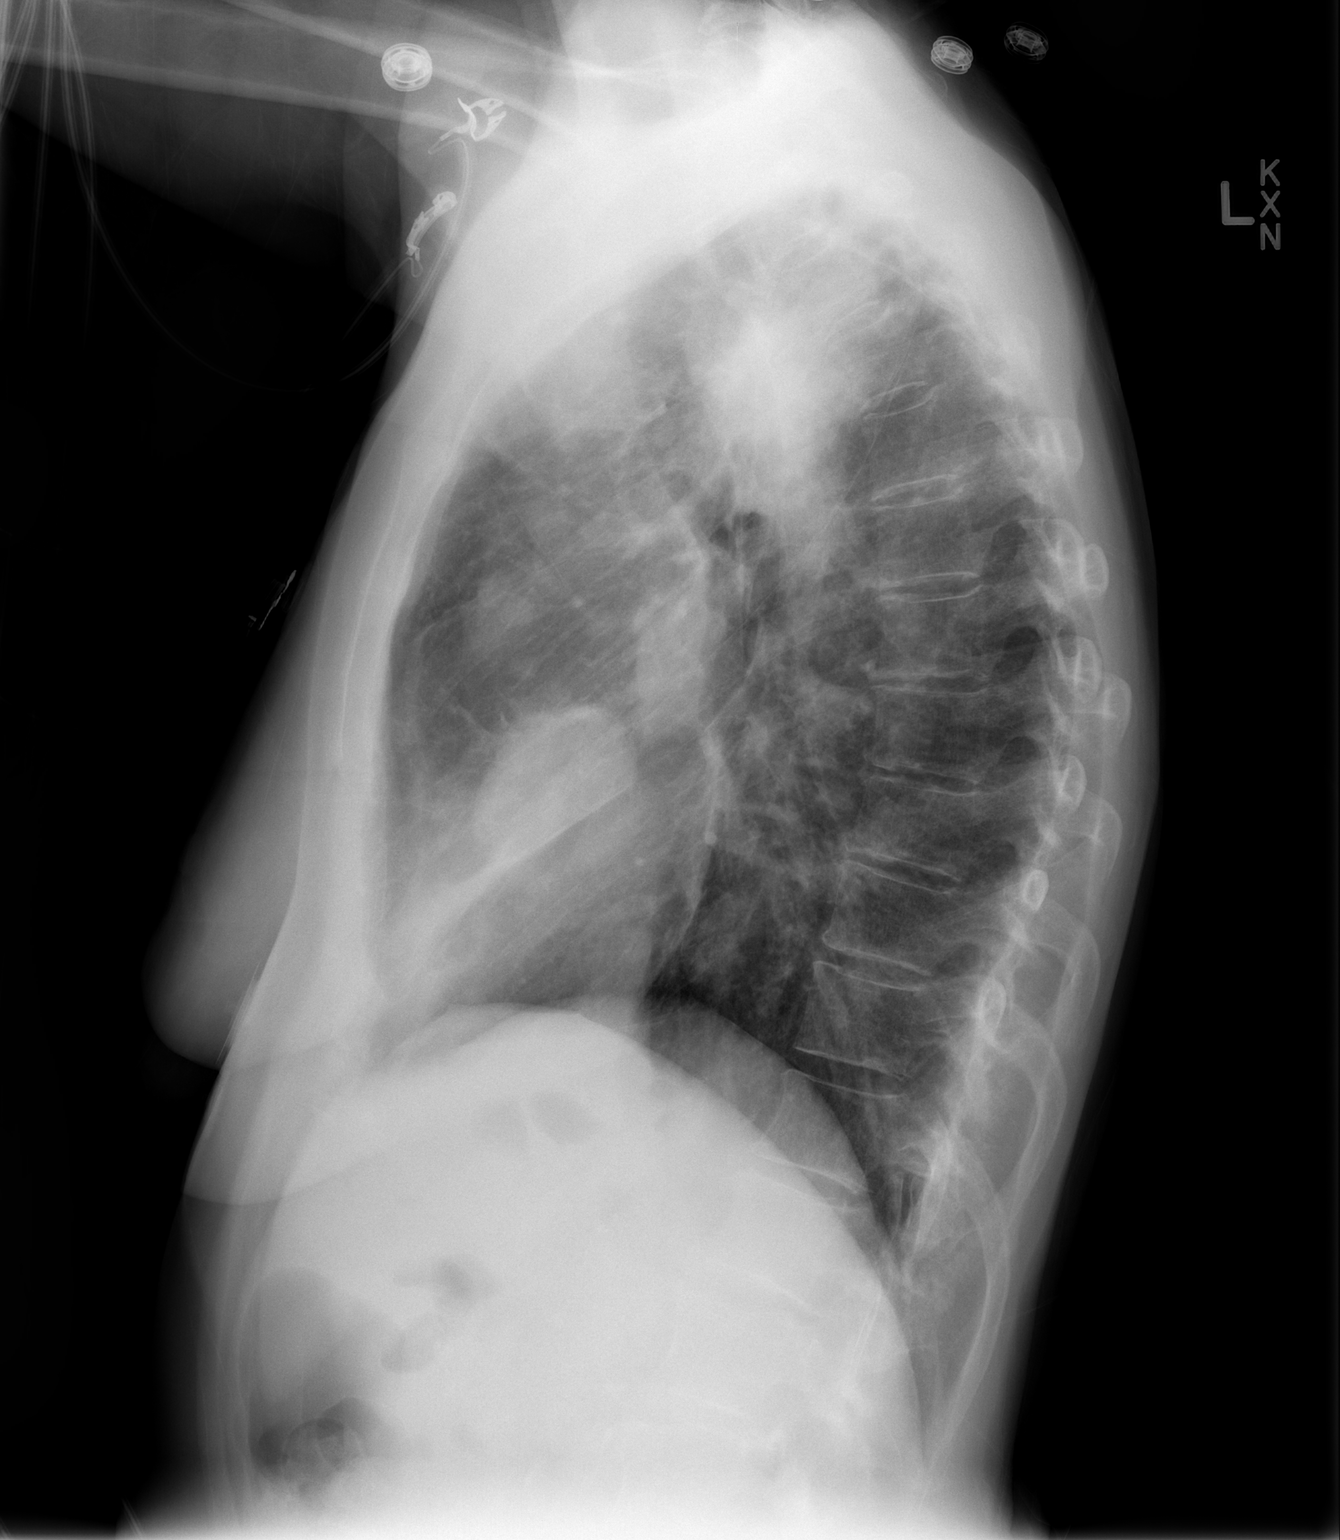

[2 of 2 positions shown; findings below may reference images not displayed]

FINDINGS: Scattered pulmonary nodules and pulmonary masses are
observed bilaterally.  An index mass in the right to middle lobe
measures 5.4 x 3.2 by 5.2 cm.  Mass-like scarring noted along the
left lung apex.

No cardiomegaly noted, but the aortic arch seems to be right-sided.

Unusual lucency at the T12-L1 level on the frontal projection may
to be due to bowel gas although a vertebral anomaly cannot be
totally excluded.

No pleural effusion is identified.
IMPRESSION: 1.  Scattered nodules and masses throughout both lungs.  Appearance
favors metastatic malignancy to both lungs.  Fungal pneumonia and
rheumatoid nodules are less likely differential diagnostic
considerations.  CT chest with contrast is recommended for further
workup, and considering the bilaterality of disease, imaging of the
abdomen and pelvis may be prudent as well.
2.  Suspected right-sided aortic arch, without cardiomegaly.
3.  Lucency at the thoracolumbar junction on the frontal projection
is somewhat unusual but probably simply due to bowel gas.  This
could be further characterized on the CT exam to ensure that does
not represent a vertebral anomaly.

## 2015-03-25 IMAGING — CT CT HEAD WO/W CM
1 series · 15 of 30 positions shown, 19 images · IV contrast (omnipaque)
Comparison: None.

CLINICAL DATA: Forgetful; known lung mass.  Assess for brain
metastases.

CT HEAD WITHOUT AND WITH CONTRAST
TECHNIQUE: Contiguous axial images were obtained from the base of
the skull through the vertex without and with intravenous contrast.
Contrast: 100mL OMNIPAQUE IOHEXOL 300 MG/ML  SOLN

[Series 2: head routine 4.8 h37s · axial · 0.43mm/px · z∈[+131,+264]mm · 15 of 30 slices shown, 19 images]
[im 2/30  brain]
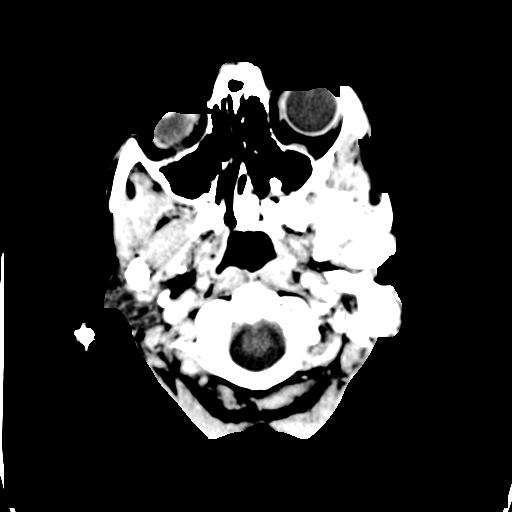
[im 2/30  bone]
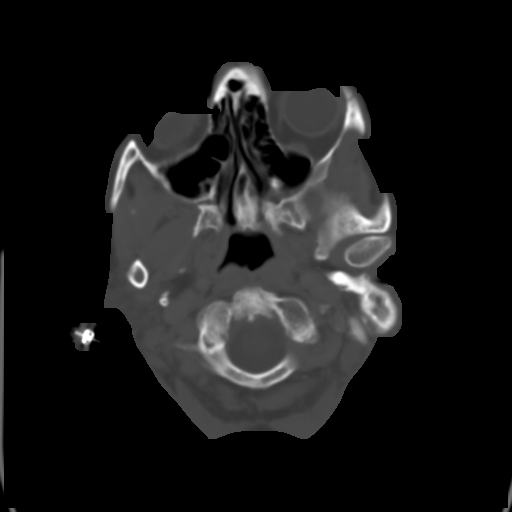
[im 4/30  brain]
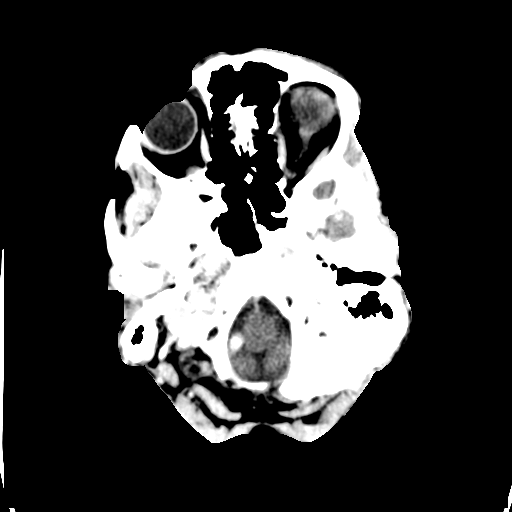
[im 6/30  brain]
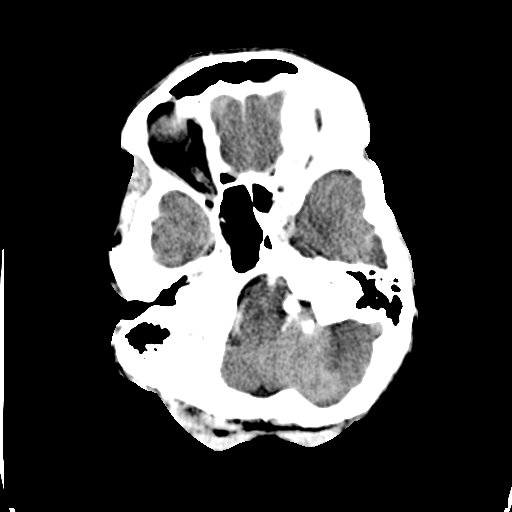
[im 8/30  brain]
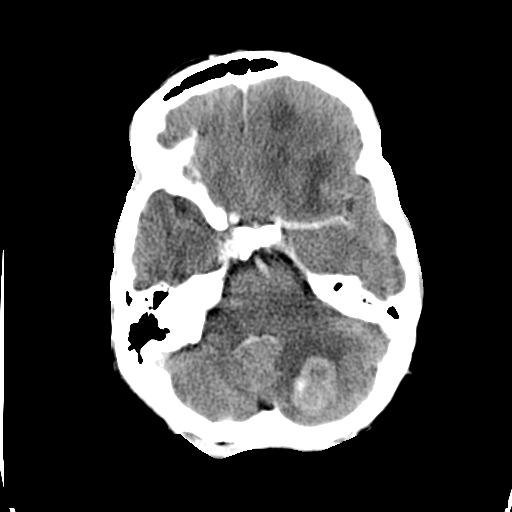
[im 10/30  brain]
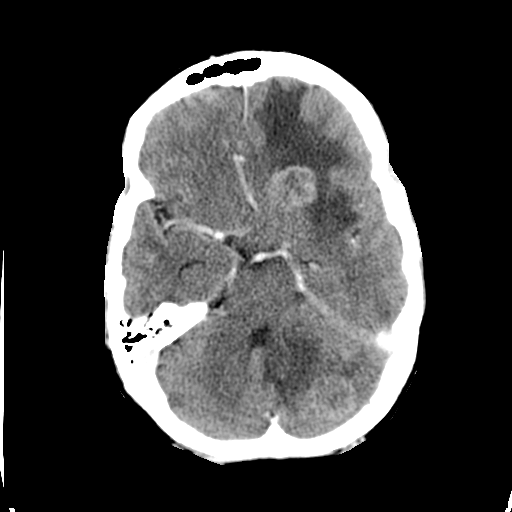
[im 10/30  bone]
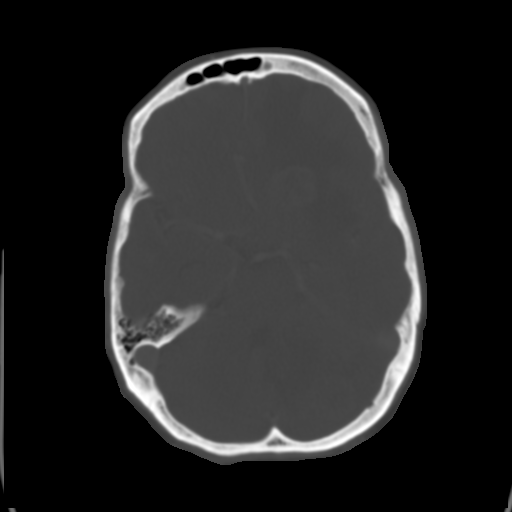
[im 12/30  brain]
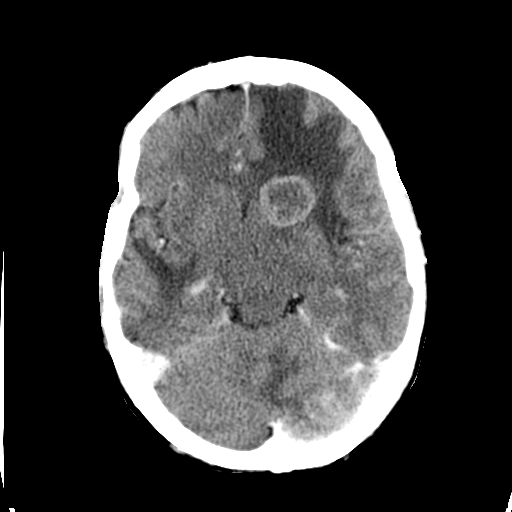
[im 14/30  brain]
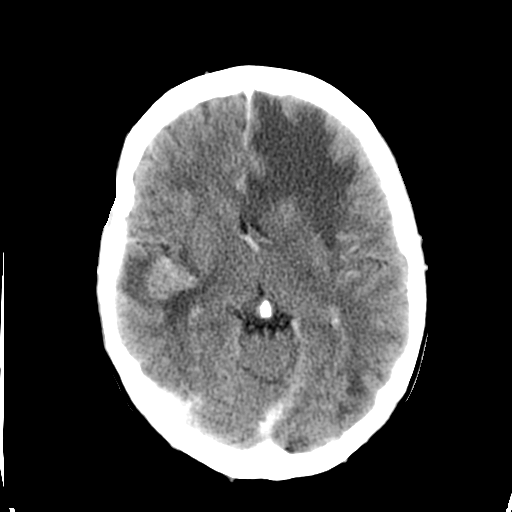
[im 16/30  brain]
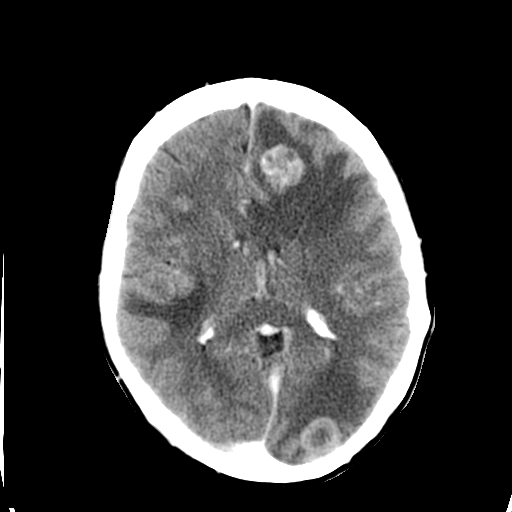
[im 17/30  brain]
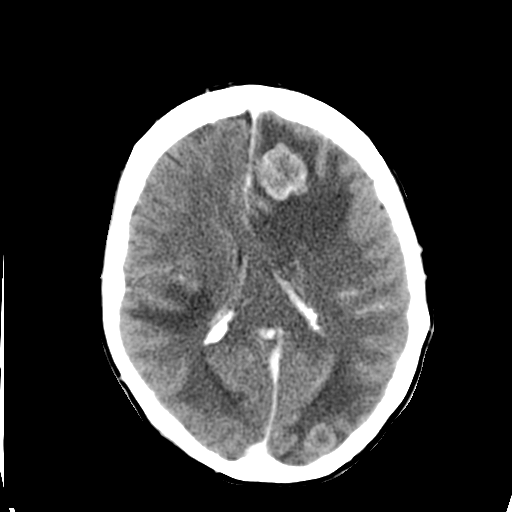
[im 17/30  bone]
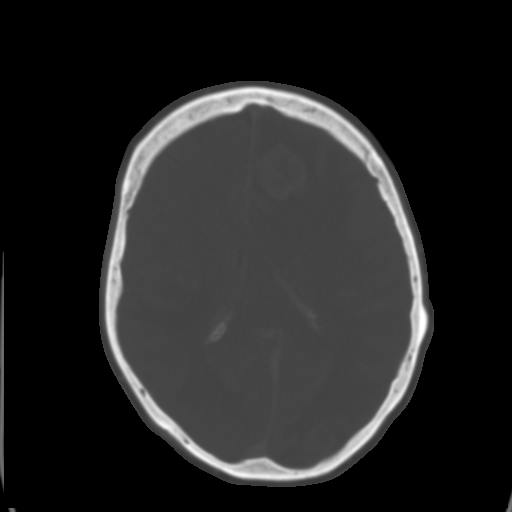
[im 19/30  brain]
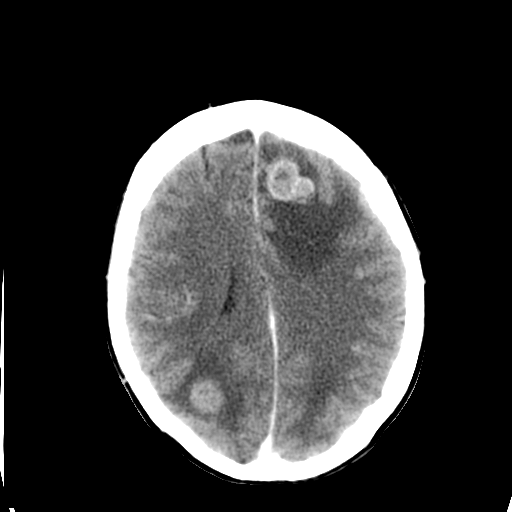
[im 21/30  brain]
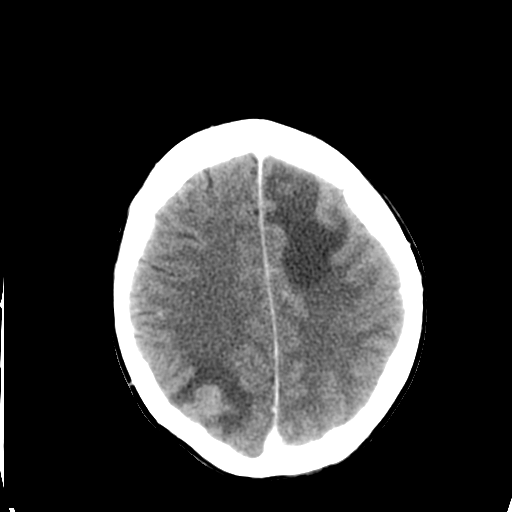
[im 23/30  brain]
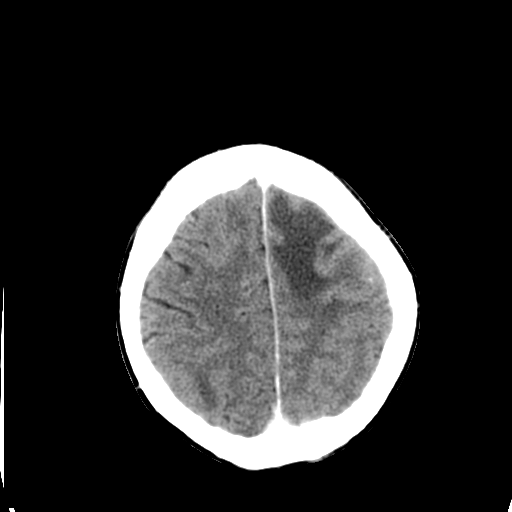
[im 25/30  brain]
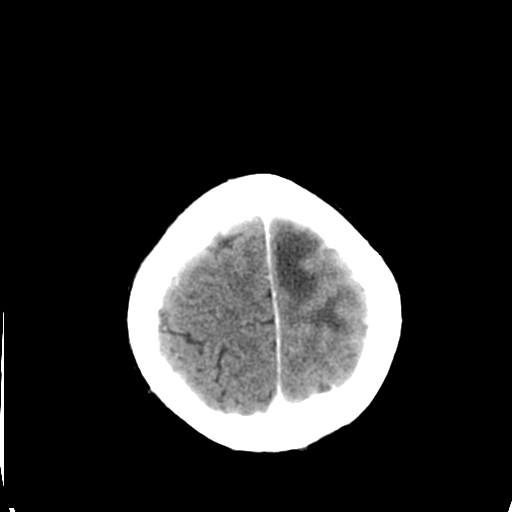
[im 25/30  bone]
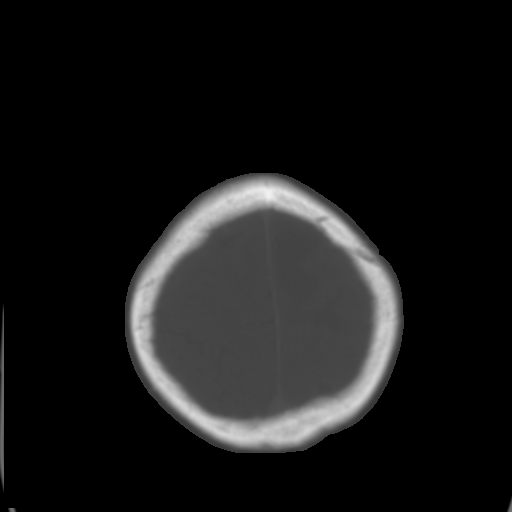
[im 27/30  brain]
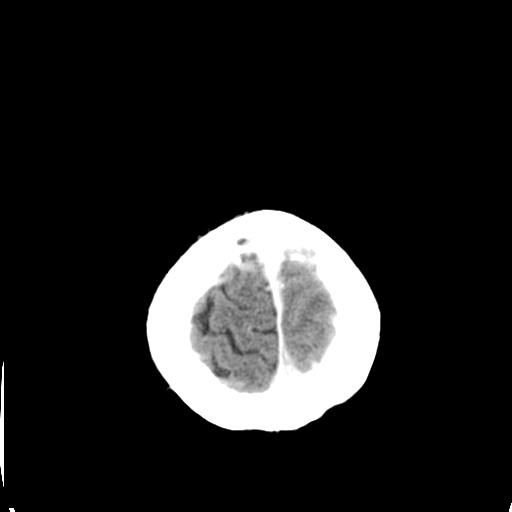
[im 29/30  brain]
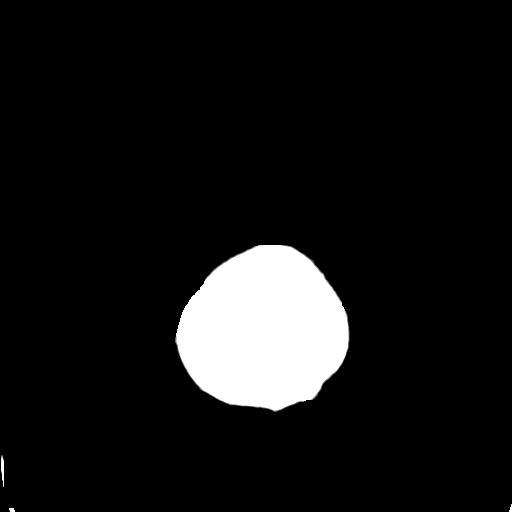

[15 of 30 positions shown; findings below may reference images not displayed]

FINDINGS: Evaluation is suboptimal due to motion artifact.
However, multiple prominent areas of decreased attenuation are seen
throughout the cerebral hemispheres and at the left side of the
cerebellum, compatible with multiple metastases.

Identifiable masses measure perhaps 2.0 cm at the high left frontal
lobe, 2.5 cm at the expected location of the frontal horn of the
left lateral ventricle, 1.5 cm at the left occipital lobe, 1.5 cm
at the left cerebellar hemisphere and 1.7 cm at the posterior right
parietal lobe.  There is perhaps 7 mm of rightward midline shift,
though this is difficult to fully characterize.

There is no evidence of acute infarction, or intra- or extra-axial
hemorrhage on CT.

There is no evidence of fracture; visualized osseous structures are
unremarkable in appearance.  The visualized portions of the orbits
are within normal limits.  The paranasal sinuses and mastoid air
cells are well-aerated.  No significant soft tissue abnormalities
are seen.
IMPRESSION: 1.  Multiple cerebral and cerebellar metastases noted, with
extensive areas of decreased attenuation, effacement of the frontal
horn of the left lateral ventricle and perhaps 7 mm of rightward
midline shift.
2.  No definite evidence of intracranial hemorrhage.

to Herkli Danila PA, who verbally acknowledged these results.
